# Patient Record
Sex: Female | Born: 1990
Health system: Southern US, Community
[De-identification: ages and names within clinical notes are randomized; demographics above are authoritative.]

## PROBLEM LIST (undated history)

## (undated) ENCOUNTER — Inpatient Hospital Stay (HOSPITAL_COMMUNITY): Payer: Self-pay

## (undated) DIAGNOSIS — R519 Headache, unspecified: Secondary | ICD-10-CM

## (undated) DIAGNOSIS — F329 Major depressive disorder, single episode, unspecified: Secondary | ICD-10-CM

## (undated) DIAGNOSIS — Z789 Other specified health status: Secondary | ICD-10-CM

## (undated) DIAGNOSIS — F3111 Bipolar disorder, current episode manic without psychotic features, mild: Secondary | ICD-10-CM

## (undated) DIAGNOSIS — N189 Chronic kidney disease, unspecified: Secondary | ICD-10-CM

## (undated) DIAGNOSIS — R87629 Unspecified abnormal cytological findings in specimens from vagina: Secondary | ICD-10-CM

## (undated) DIAGNOSIS — F419 Anxiety disorder, unspecified: Secondary | ICD-10-CM

## (undated) DIAGNOSIS — F32A Depression, unspecified: Secondary | ICD-10-CM

## (undated) DIAGNOSIS — A549 Gonococcal infection, unspecified: Secondary | ICD-10-CM

## (undated) DIAGNOSIS — Z8759 Personal history of other complications of pregnancy, childbirth and the puerperium: Secondary | ICD-10-CM

## (undated) DIAGNOSIS — B999 Unspecified infectious disease: Secondary | ICD-10-CM

## (undated) DIAGNOSIS — R51 Headache: Secondary | ICD-10-CM

## (undated) HISTORY — DX: Chronic kidney disease, unspecified: N18.9

## (undated) HISTORY — DX: Depression, unspecified: F32.A

## (undated) HISTORY — PX: WISDOM TOOTH EXTRACTION: SHX21

## (undated) HISTORY — DX: Bipolar disorder, current episode manic without psychotic features, mild: F31.11

## (undated) HISTORY — DX: Major depressive disorder, single episode, unspecified: F32.9

## (undated) HISTORY — DX: Anxiety disorder, unspecified: F41.9

## (undated) HISTORY — PX: NO PAST SURGERIES: SHX2092

---
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
## 2010-05-10 ENCOUNTER — Inpatient Hospital Stay (HOSPITAL_COMMUNITY)
Admission: AD | Admit: 2010-05-10 | Discharge: 2010-05-10 | Payer: Self-pay | Source: Home / Self Care | Attending: Obstetrics & Gynecology | Admitting: Obstetrics & Gynecology

## 2010-05-11 LAB — WET PREP, GENITAL
Trich, Wet Prep: NONE SEEN
Yeast Wet Prep HPF POC: NONE SEEN

## 2010-05-11 LAB — URINALYSIS, ROUTINE W REFLEX MICROSCOPIC
Bilirubin Urine: NEGATIVE
Hgb urine dipstick: NEGATIVE
Ketones, ur: NEGATIVE mg/dL
Nitrite: NEGATIVE
Protein, ur: NEGATIVE mg/dL
Specific Gravity, Urine: 1.025 (ref 1.005–1.030)
Urine Glucose, Fasting: NEGATIVE mg/dL
Urobilinogen, UA: 0.2 mg/dL (ref 0.0–1.0)
pH: 7 (ref 5.0–8.0)

## 2010-05-11 LAB — CBC
HCT: 37.2 % (ref 36.0–46.0)
Hemoglobin: 13.4 g/dL (ref 12.0–15.0)
MCH: 30.3 pg (ref 26.0–34.0)
MCHC: 36 g/dL (ref 30.0–36.0)
MCV: 84.2 fL (ref 78.0–100.0)
Platelets: 192 10*3/uL (ref 150–400)
RBC: 4.42 MIL/uL (ref 3.87–5.11)
RDW: 11.7 % (ref 11.5–15.5)
WBC: 10.7 10*3/uL — ABNORMAL HIGH (ref 4.0–10.5)

## 2010-05-11 LAB — POCT PREGNANCY, URINE: Preg Test, Ur: POSITIVE

## 2010-05-11 LAB — ABO/RH: ABO/RH(D): O POS

## 2010-05-11 LAB — HCG, QUANTITATIVE, PREGNANCY: hCG, Beta Chain, Quant, S: 798 m[IU]/mL — ABNORMAL HIGH (ref <5)

## 2010-05-16 LAB — GC/CHLAMYDIA PROBE AMP, GENITAL
Chlamydia, DNA Probe: NEGATIVE
GC Probe Amp, Genital: NEGATIVE

## 2010-05-20 ENCOUNTER — Inpatient Hospital Stay (HOSPITAL_COMMUNITY)
Admission: AD | Admit: 2010-05-20 | Discharge: 2010-05-20 | Payer: Self-pay | Source: Home / Self Care | Attending: Obstetrics & Gynecology | Admitting: Obstetrics & Gynecology

## 2010-08-01 LAB — CBC
HCT: 39.4 % (ref 36.0–46.0)
Hemoglobin: 13.6 g/dL (ref 12.0–15.0)
MCHC: 34.6 g/dL (ref 30.0–36.0)
MCV: 90.5 fL (ref 78.0–100.0)
Platelets: 147 10*3/uL — ABNORMAL LOW (ref 150–400)
RBC: 4.36 MIL/uL (ref 3.87–5.11)
RDW: 12.1 % (ref 11.5–15.5)
WBC: 7.9 10*3/uL (ref 4.0–10.5)

## 2010-08-01 LAB — URINALYSIS, ROUTINE W REFLEX MICROSCOPIC
Bilirubin Urine: NEGATIVE
Glucose, UA: NEGATIVE mg/dL
Hgb urine dipstick: NEGATIVE
Ketones, ur: NEGATIVE mg/dL
Nitrite: NEGATIVE
Protein, ur: NEGATIVE mg/dL
Specific Gravity, Urine: 1.015 (ref 1.005–1.030)
Urobilinogen, UA: 1 mg/dL (ref 0.0–1.0)
pH: 6 (ref 5.0–8.0)

## 2010-08-01 LAB — COMPREHENSIVE METABOLIC PANEL
ALT: 13 U/L (ref 0–35)
AST: 24 U/L (ref 0–37)
Albumin: 4 g/dL (ref 3.5–5.2)
Alkaline Phosphatase: 83 U/L (ref 39–117)
BUN: 7 mg/dL (ref 6–23)
CO2: 26 mEq/L (ref 19–32)
Calcium: 9 mg/dL (ref 8.4–10.5)
Chloride: 102 mEq/L (ref 96–112)
Creatinine, Ser: 0.71 mg/dL (ref 0.4–1.2)
GFR calc Af Amer: >60 mL/min (ref >60)
GFR calc non Af Amer: >60 mL/min (ref >60)
Glucose, Bld: 82 mg/dL (ref 70–99)
Potassium: 3.9 mEq/L (ref 3.5–5.1)
Sodium: 135 mEq/L (ref 135–145)
Total Bilirubin: 0.6 mg/dL (ref 0.3–1.2)
Total Protein: 6.9 g/dL (ref 6.0–8.3)

## 2010-08-01 LAB — RAPID STREP SCREEN (MED CTR MEBANE ONLY): Streptococcus, Group A Screen (Direct): NEGATIVE

## 2010-08-01 LAB — POCT PREGNANCY, URINE: Preg Test, Ur: NEGATIVE

## 2010-08-04 LAB — WET PREP, GENITAL
Trich, Wet Prep: NONE SEEN
Yeast Wet Prep HPF POC: NONE SEEN

## 2010-08-04 LAB — HCG, SERUM, QUALITATIVE: Preg, Serum: NEGATIVE

## 2010-08-04 LAB — CBC
HCT: 40.1 % (ref 36.0–46.0)
Hemoglobin: 13.4 g/dL (ref 12.0–15.0)
MCHC: 33.5 g/dL (ref 30.0–36.0)
MCV: 90.9 fL (ref 78.0–100.0)
Platelets: 175 10*3/uL (ref 150–400)
RBC: 4.41 MIL/uL (ref 3.87–5.11)
RDW: 12.3 % (ref 11.5–15.5)
WBC: 5.9 10*3/uL (ref 4.0–10.5)

## 2010-08-04 LAB — SAMPLE TO BLOOD BANK

## 2010-08-04 LAB — GC/CHLAMYDIA PROBE AMP, GENITAL
Chlamydia, DNA Probe: NEGATIVE
GC Probe Amp, Genital: NEGATIVE

## 2010-10-11 ENCOUNTER — Other Ambulatory Visit: Payer: Self-pay | Admitting: Obstetrics

## 2010-10-11 ENCOUNTER — Ambulatory Visit (HOSPITAL_COMMUNITY)
Admission: RE | Admit: 2010-10-11 | Discharge: 2010-10-11 | Disposition: A | Discharge status: Home / Self Care | Payer: Medicaid Other | Source: Ambulatory Visit | Attending: Obstetrics | Admitting: Obstetrics

## 2010-10-11 ENCOUNTER — Other Ambulatory Visit (HOSPITAL_COMMUNITY): Payer: Self-pay | Admitting: Obstetrics

## 2010-10-11 ENCOUNTER — Inpatient Hospital Stay (HOSPITAL_COMMUNITY)
Admission: AD | Admit: 2010-10-11 | Discharge: 2010-10-12 | DRG: 779 | Disposition: A | Discharge status: Home / Self Care | Payer: Medicaid Other | Source: Ambulatory Visit | Attending: Obstetrics | Admitting: Obstetrics

## 2010-10-11 DIAGNOSIS — Z3689 Encounter for other specified antenatal screening: Secondary | ICD-10-CM | POA: Insufficient documentation

## 2010-10-11 DIAGNOSIS — O021 Missed abortion: Principal | ICD-10-CM | POA: Diagnosis present

## 2010-10-11 DIAGNOSIS — O368390 Maternal care for abnormalities of the fetal heart rate or rhythm, unspecified trimester, not applicable or unspecified: Secondary | ICD-10-CM | POA: Insufficient documentation

## 2010-10-11 LAB — CBC
HCT: 35.9 % — ABNORMAL LOW (ref 36.0–46.0)
Hemoglobin: 12.7 g/dL (ref 12.0–15.0)
MCH: 31.4 pg (ref 26.0–34.0)
MCHC: 35.4 g/dL (ref 30.0–36.0)
MCV: 88.9 fL (ref 78.0–100.0)
Platelets: 158 10*3/uL (ref 150–400)
RBC: 4.04 MIL/uL (ref 3.87–5.11)
RDW: 12.2 % (ref 11.5–15.5)
WBC: 11.4 10*3/uL — ABNORMAL HIGH (ref 4.0–10.5)

## 2010-10-11 LAB — MRSA PCR SCREENING: MRSA by PCR: NEGATIVE

## 2010-10-19 NOTE — Discharge Summary (Signed)
  NAMEMarland Kitchen  MYRTH, DAHAN NO.:  [id number]  MEDICAL RECORD NO.:  [id number]  LOCATION:  9372                          FACILITY:  [facility]  PHYSICIAN:  Kathreen Cosier, M.D.DATE OF BIRTH:  09/03/1990  DATE OF ADMISSION:  10/11/2010 DATE OF DISCHARGE:  10/12/2010                              DISCHARGE SUMMARY   The patient is a 20 year old gravida 1, EDC January 14, 2011, who was seen in the office on August 16, 2010, at 18+ weeks.  Ultrasound performed gave her due dated January 20, 2011, without any problems. The patient came for a followup visit on October 11, 2010, and the uterus was still 18-week size and ultrasound confirmed fetal demise.  The patient was admitted and she had Cytotec 600 mcg placed intravaginally at 4 p.m. and at 12:25 a.m., she passed the fetus and placenta intact. The patient was upset and given Xanax 0.5 t.i.d. and Tylenol No. 3 for pain.  Her platelets were 175, she was O+, antibody negative, RPR negative, hepatitis screen negative.  She was discharged on October 20, 2010, ambulatory on a regular diet to see me in 4 weeks.  DISCHARGE DIAGNOSIS:  Intrauterine fetal transfusion at 19 weeks.          ______________________________ Kathreen Cosier, M.D.     BAM/MEDQ  D:  10/12/2010  T:  10/12/2010  Job:  [id number]  Electronically Signed by Francoise Ceo M.D. on 10/19/2010 09:17:51 AM

## 2010-10-23 DEATH — deceased

## 2010-11-23 ENCOUNTER — Inpatient Hospital Stay (HOSPITAL_COMMUNITY): Admission: AD | Admit: 2010-11-23 | Payer: Self-pay | Source: Ambulatory Visit | Admitting: Obstetrics

## 2011-11-15 ENCOUNTER — Encounter (HOSPITAL_COMMUNITY): Payer: Self-pay | Admitting: *Deleted

## 2011-11-15 ENCOUNTER — Emergency Department (HOSPITAL_COMMUNITY)
Admission: EM | Admit: 2011-11-15 | Discharge: 2011-11-15 | Disposition: A | Discharge status: Home / Self Care | Payer: Self-pay | Attending: Emergency Medicine | Admitting: Emergency Medicine

## 2011-11-15 DIAGNOSIS — F17200 Nicotine dependence, unspecified, uncomplicated: Secondary | ICD-10-CM | POA: Insufficient documentation

## 2011-11-15 DIAGNOSIS — R55 Syncope and collapse: Secondary | ICD-10-CM | POA: Insufficient documentation

## 2011-11-15 LAB — GLUCOSE, CAPILLARY: Glucose-Capillary: 76 mg/dL (ref 70–99)

## 2011-11-15 NOTE — ED Notes (Signed)
Pt reports having dizziness when she arrived at work today, states that she had nuggets and drank some coke prior to the dizziness.  Pt reports when she started to work, she became dizzy again and became nauseous.  Pt states that she sat down and drank some sprite, dizziness went away but does not feel the same.

## 2011-11-15 NOTE — ED Provider Notes (Addendum)
History     CSN: [id number]  Arrival date & time 11/15/11  1158   First MD Initiated Contact with Patient 11/15/11 1227      Chief Complaint  Patient presents with  . Dizziness    (Consider location/radiation/quality/duration/timing/severity/associated sxs/prior treatment) HPI Comments: Patient presents after an episode of lightheadedness and dizziness while at work.  Patient works at [employer] and was taking orders and started to feel lightheaded and dizzy.  She notes that she started to feel like things were going black and that she was hot and sweaty.  She sat down and after a few minutes began to feel somewhat better.  She states that she did her normal morning routine and did have some chicken nuggets and Coca[employer]a this morning.  She reports that she still feels slightly "off" but otherwise back to normal.  Her last menstrual period was July 1 and normal for her.  She does note that she is attempting to get pregnant.  She has no abdominal pain.  She notes no chest pain or shortness of breath.  She states she's been otherwise well and without fevers, vomiting or diarrhea.  No history of diabetes.  The history is provided by the patient. No language interpreter was used.    History reviewed. No pertinent past medical history.  History reviewed. No pertinent past surgical history.  History reviewed. No pertinent family history.  History  Substance Use Topics  . Smoking status: Current Everyday Smoker -- 1.0 packs/day    Types: Cigarettes  . Smokeless tobacco: Not on file  . Alcohol Use: Yes     occa    OB History    Grav Para Term Preterm Abortions TAB SAB Ect Mult Living                  Review of Systems  Constitutional: Positive for diaphoresis. Negative for fever and chills.  HENT: Negative.   Eyes: Negative.   Respiratory: Negative.  Negative for shortness of breath.   Cardiovascular: Negative.  Negative for chest pain.  Gastrointestinal: Positive for nausea.  Negative for vomiting, abdominal pain and diarrhea.  Genitourinary: Negative.  Negative for dysuria.  Musculoskeletal: Negative.  Negative for back pain.  Skin: Negative.  Negative for color change and rash.  Neurological: Positive for dizziness and light-headedness. Negative for syncope and headaches.  Hematological: Negative.   Psychiatric/Behavioral: Negative.  Negative for confusion.  All other systems reviewed and are negative.    Allergies  Review of patient's allergies indicates no known allergies.  Home Medications  No current outpatient prescriptions on file.  BP 123/55  Pulse 96  Temp 99.4 F (37.4 C) (Oral)  Resp 20  SpO2 99%  LMP 10/23/2011  Physical Exam  Nursing note and vitals reviewed. Constitutional: She is oriented to person, place, and time. She appears well-developed and well-nourished.  Non-toxic appearance. She does not have a sickly appearance.  HENT:  Head: Normocephalic and atraumatic.  Eyes: Conjunctivae, EOM and lids are normal. Pupils are equal, round, and reactive to light. No scleral icterus.  Neck: Trachea normal and normal range of motion. Neck supple.  Cardiovascular: Normal rate, regular rhythm and normal heart sounds.  Exam reveals no gallop.   No murmur heard. Pulmonary/Chest: Effort normal and breath sounds normal. No respiratory distress. She has no wheezes. She has no rales.  Abdominal: Soft. Normal appearance. There is no tenderness. There is no rebound, no guarding and no CVA tenderness.  Musculoskeletal: Normal range of motion.  Neurological:  She is alert and oriented to person, place, and time. She has normal strength.  Skin: Skin is warm, dry and intact. No rash noted.  Psychiatric: She has a normal mood and affect. Her behavior is normal. Judgment and thought content normal.    ED Course  Procedures (including critical care time) Results for orders placed during the hospital encounter of 11/15/11  GLUCOSE, CAPILLARY       Component Value Range   Glucose-Capillary 76  70 - 99 mg/dL   Comment 1 Notify RN    POCT PREGNANCY, URINE      Component Value Range   Preg Test, Ur NEGATIVE  NEGATIVE       Date: 11/15/2011  Rate: 68  Rhythm: normal sinus rhythm  QRS Axis: normal  Intervals: normal  ST/T Wave abnormalities: normal  Conduction Disutrbances:none  Narrative Interpretation:   Old EKG Reviewed: none available    MDM  Patient with likely vasovagal presyncope.  Her glucose here is normal.  Her vital signs are also normal.  Her EKG shows no acute dysrhythmias or abnormalities.  I will check a pregnancy test as well.  I anticipate the patient's discharge with followup with her primary care physician.  She has no symptoms for infection at this time.  I've encouraged her to continue with good hydration and given her precautions to lay down should further episodes occur in the future.        Nat Christen, MD 11/15/11 1304  Nat Christen, MD 11/15/11 1324

## 2011-11-28 ENCOUNTER — Inpatient Hospital Stay (HOSPITAL_COMMUNITY)
Admission: AD | Admit: 2011-11-28 | Discharge: 2011-11-28 | Disposition: A | Discharge status: Home / Self Care | Payer: Medicaid Other | Source: Ambulatory Visit | Attending: Obstetrics | Admitting: Obstetrics

## 2011-11-28 ENCOUNTER — Encounter (HOSPITAL_COMMUNITY): Payer: Self-pay

## 2011-11-28 DIAGNOSIS — R42 Dizziness and giddiness: Secondary | ICD-10-CM

## 2011-11-28 DIAGNOSIS — Z3201 Encounter for pregnancy test, result positive: Secondary | ICD-10-CM

## 2011-11-28 HISTORY — DX: Personal history of other complications of pregnancy, childbirth and the puerperium: Z87.59

## 2011-11-28 LAB — CBC
HCT: 36.8 % (ref 36.0–46.0)
Hemoglobin: 12.4 g/dL (ref 12.0–15.0)
RBC: 4.12 MIL/uL (ref 3.87–5.11)
WBC: 7.1 10*3/uL (ref 4.0–10.5)

## 2011-11-28 LAB — POCT PREGNANCY, URINE: Preg Test, Ur: POSITIVE — AB

## 2011-11-28 LAB — HCG, QUANTITATIVE, PREGNANCY: hCG, Beta Chain, Quant, S: 1866 m[IU]/mL — ABNORMAL HIGH (ref <5)

## 2011-11-28 LAB — URINALYSIS, ROUTINE W REFLEX MICROSCOPIC
Glucose, UA: NEGATIVE mg/dL
Hgb urine dipstick: NEGATIVE
Protein, ur: NEGATIVE mg/dL
Specific Gravity, Urine: 1.02 (ref 1.005–1.030)
pH: 7.5 (ref 5.0–8.0)
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
## 2011-11-28 NOTE — MAU Note (Signed)
Pt states, " I've been dizzy for the past two weeks, and I didn't start my period last week."

## 2011-11-28 NOTE — MAU Provider Note (Signed)
History     CSN: [id number]  Arrival date and time: 11/28/11 1117   None     Chief Complaint  Patient presents with  . Dizziness  . Amenorrhea   HPI Morgan Galloway is a 21 y.o. female @ 4.[redacted] weeks gestation who presents to MAU with dizziness. She was evaluated @ [facility] 7/24 for same.  On her 7/24 visit she had normal EKG, neg pregnancy test and normal glucose. She was instructed to follow up with PCP. She comes to MAU today because she missed her period. Last period 10/25/11. No birth control, unprotected sex. Current sex partner x 1 year. 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 No follow up. Hx of GC and Chlamydia. Denies vaginal discharge or pelvic pain. Patient states that she missed her period, has breast tenderness and feel pregnant. Last sexual intercourse this morning. States she has sex at least 5 days a week. The history was provided by the patient.  OB History    Grav Para Term Preterm Abortions TAB SAB Ect Mult Living   1 1  1       0      Past Medical History  Diagnosis Date  . History of stillbirth     June 2012    Past Surgical History  Procedure Date  . No past surgeries     Family History  Problem Relation Age of Onset  . Other Neg Hx     History  Substance Use Topics  . Smoking status: Current Everyday Smoker -- 0.5 packs/day    Types: Cigarettes  . Smokeless tobacco: Never Used  . Alcohol Use: No    Allergies: No Known Allergies  No prescriptions prior to admission    Review of Systems  Constitutional: Negative for fever, chills and weight loss.  HENT: Negative for ear pain, nosebleeds, congestion, sore throat and neck pain.   Eyes: Negative for blurred vision, double vision, photophobia and pain.  Respiratory: Negative for cough, shortness of breath and wheezing.   Cardiovascular: Negative for chest pain, palpitations and leg swelling.  Gastrointestinal: Positive for constipation. Negative for heartburn, nausea, vomiting, abdominal pain and  diarrhea.  Genitourinary: Negative for dysuria, urgency and frequency.  Musculoskeletal: Negative for myalgias and back pain.  Skin: Negative for itching and rash.  Neurological: Positive for dizziness and headaches. Negative for sensory change, speech change, seizures and weakness.  Endo/Heme/Allergies: Does not bruise/bleed easily.  Psychiatric/Behavioral: Negative for depression. The patient is not nervous/anxious and does not have insomnia.    Physical Exam   Blood pressure 118/54, pulse 68, temperature 97.4 F (36.3 C), temperature source Oral, height 5' 4.5" (1.638 m), weight 125 lb 6 oz (56.87 kg), last menstrual period 10/23/2011, SpO2 100.00%.  Physical Exam  Constitutional: She is oriented to person, place, and time. She appears well-developed and well-nourished. No distress.  HENT:  Head: Normocephalic and atraumatic.  Eyes: EOM are normal.  Neck: Neck supple.  Cardiovascular: Normal rate.   Respiratory: Effort normal.  GI: Soft. There is no tenderness.  Musculoskeletal: Normal range of motion.  Neurological: She is alert and oriented to person, place, and time.  Skin: Skin is warm and dry.  Psychiatric: She has a normal mood and affect. Her behavior is normal. Judgment and thought content normal.    MAU Course: patient states she only wants conformation of pregnancy today.  Procedures Discussed with the patient and all questioned fully answered. She will return if any problems arise.  Results for orders  placed during the hospital encounter of 11/28/11 (from the past 24 hour(s))  URINALYSIS, ROUTINE W REFLEX MICROSCOPIC     Status: Normal   Collection Time   11/28/11 11:38 AM      Component Value Range   Color, Urine YELLOW  YELLOW   APPearance CLEAR  CLEAR   Specific Gravity, Urine 1.020  1.005 - 1.030   pH 7.5  5.0 - 8.0   Glucose, UA NEGATIVE  NEGATIVE mg/dL   Hgb urine dipstick NEGATIVE  NEGATIVE   Bilirubin Urine NEGATIVE  NEGATIVE   Ketones, ur NEGATIVE   NEGATIVE mg/dL   Protein, ur NEGATIVE  NEGATIVE mg/dL   Urobilinogen, UA 0.2  0.0 - 1.0 mg/dL   Nitrite NEGATIVE  NEGATIVE   Leukocytes, UA NEGATIVE  NEGATIVE  POCT PREGNANCY, URINE     Status: Abnormal   Collection Time   11/28/11  2:25 PM      Component Value Range   Preg Test, Ur POSITIVE (*) NEGATIVE   Follow-up Information    Schedule an appointment as soon as possible for a visit with Kathreen Cosier, MD.   Contact information:   [address] [place] [address] [phone]         Assessment: Positive pregnancy test    Plan:  Start prenatal care and prenatal vitamins   Instructions on ABC's of pregnancy   Return as needed.  Shaia Porath, RN, FNP, [facility] 11/28/2011, 2:29 PM

## 2011-12-20 ENCOUNTER — Encounter (HOSPITAL_COMMUNITY): Payer: Self-pay | Admitting: *Deleted

## 2011-12-20 ENCOUNTER — Inpatient Hospital Stay (HOSPITAL_COMMUNITY)
Admission: AD | Admit: 2011-12-20 | Discharge: 2011-12-20 | Disposition: A | Discharge status: Home / Self Care | Payer: Medicaid Other | Source: Ambulatory Visit | Attending: Obstetrics & Gynecology | Admitting: Obstetrics & Gynecology

## 2011-12-20 ENCOUNTER — Inpatient Hospital Stay (HOSPITAL_COMMUNITY): Payer: Medicaid Other

## 2011-12-20 DIAGNOSIS — O209 Hemorrhage in early pregnancy, unspecified: Secondary | ICD-10-CM | POA: Insufficient documentation

## 2011-12-20 DIAGNOSIS — O468X9 Other antepartum hemorrhage, unspecified trimester: Secondary | ICD-10-CM

## 2011-12-20 LAB — WET PREP, GENITAL
Trich, Wet Prep: NONE SEEN
Yeast Wet Prep HPF POC: NONE SEEN

## 2011-12-20 NOTE — MAU Note (Signed)
First episode of any bleeding.

## 2011-12-20 NOTE — MAU Note (Signed)
C/o bright red spotting that started about a hour ago; denies any UTI symptoms; denies any pain; states that bleeding was not enough to wear a pad; FOB with pt; intercourse earlier today;

## 2011-12-20 NOTE — ED Provider Notes (Signed)
History     CSN: [id number]  Arrival date and time: 12/20/11 1604   First Provider Initiated Contact with Patient 12/20/11 1637      Chief Complaint  Patient presents with  . Vaginal Bleeding   HPI Patient is a 21 year old g2p0010 at [redacted]w[redacted]d by LMP with previous fetal demise secondary to nuchal ord complications who presents with spotting that began this afternoon. States she saw a couple of drops on her underwear and when she wiped her vagina. No clots or other discharge noted. She most recently had sex this am. Notes some "need to bm" discomfort. No cramping, contractions, pain, headache, N/V, SOB, anorexia, dysuria, lightheaded, dizziness or syncope.  Presented to Currie Paris, NP   OB History    Grav Para Term Preterm Abortions TAB SAB Ect Mult Living   2 1  1       0      Past Medical History  Diagnosis Date  . History of stillbirth     June 2012    Past Surgical History  Procedure Date  . No past surgeries     Family History  Problem Relation Age of Onset  . Other Neg Hx   . Cancer Maternal Grandmother     History  Substance Use Topics  . Smoking status: Former Smoker -- 0.5 packs/day    Types: Cigarettes  . Smokeless tobacco: Never Used  . Alcohol Use: No    Allergies: No Known Allergies  Prescriptions prior to admission  Medication Sig Dispense Refill  . Prenatal Vit-Fe Fumarate-FA (PRENATAL MULTIVITAMIN) TABS Take 1 tablet by mouth daily.        ROS SEE HPI Physical Exam   Blood pressure 127/51, pulse 69, temperature 98.3 F (36.8 C), temperature source Oral, resp. rate 16, height 5\' 5"  (1.651 m), weight 126 lb (57.153 kg), last menstrual period 10/23/2011.  Physical Exam  Constitutional: She appears well-developed and well-nourished. No distress.  HENT:  Head: Normocephalic and atraumatic.  Right Ear: External ear normal.  Left Ear: External ear normal.  Cardiovascular: Normal rate, regular rhythm, S1 normal, S2 normal, normal heart  sounds and intact distal pulses.  Exam reveals no gallop and no friction rub.   No murmur heard. Respiratory: Effort normal and breath sounds normal. No respiratory distress. She has no wheezes. She has no rales. She exhibits no tenderness.  GI: Soft. Normal aorta and bowel sounds are normal. She exhibits no distension, no pulsatile liver, no abdominal bruit and no mass. There is no hepatosplenomegaly. There is no tenderness. There is no rebound and no guarding.  Genitourinary: Vagina normal. There is no rash, tenderness, lesion or injury on the right labia. There is no tenderness, lesion or injury on the left labia.       Cervix closed, trace blood in the os. No odor or other discharge. G/C Wet prep done.  Chaperone Present during exam.  Skin: She is not diaphoretic.    MAU Course  Procedures Clinical Data: Vaginal bleeding. Assess viability.  OBSTETRIC <14 WK [place] AND TRANSVAGINAL OB US  Technique: Both transabdominal and transvaginal ultrasound  examinations were performed for complete evaluation of the  gestation as well as the maternal uterus, adnexal regions, and  pelvic cul-de-sac. Transvaginal technique was performed to assess  early pregnancy.  Comparison: None for this pregnancy.  Intrauterine gestational sac: A single intrauterine gestational  sac is somewhat irregular in shape.  Yolk sac: An enlarged yolk sac is present.  Embryo: Present.  Cardiac Activity: Not present  MSD: 1.62 mm 6 w 4 d  CRL: 0.51 mm 6 w 2 d [place] EDC: 08/13/2011  Maternal uterus/adnexae:  The ovaries are within normal limits bilaterally. A small rounded  subchorionic hemorrhage is present along the inferior aspect of the  gestational sac. There is no significant free fluid.  IMPRESSION:  1. Slightly irregular gestational sac and small subchorionic  hemorrhage.  2. Although the embryo is identified, no definite heartbeat is  seen. The given the small size of the embryo, recommend 14-day  follow-up  ultrasound for further evaluation. The scan may be too  early to identify a heart beat.  MDM Results for orders placed during the hospital encounter of 12/20/11 (from the past 24 hour(s))  WET PREP, GENITAL     Status: Abnormal   Collection Time   12/20/11  5:00 PM      Component Value Range   Yeast Wet Prep HPF POC NONE SEEN  NONE SEEN   Trich, Wet Prep NONE SEEN  NONE SEEN   Clue Cells Wet Prep HPF POC MODERATE (*) NONE SEEN   WBC, Wet Prep HPF POC NONE SEEN  NONE SEEN    Assessment and Plan  Bleeding in pregnancy  - Gestational age [redacted]w[redacted]d Subchorionic hemorrhage  Plan Pelvic rest Repeat ultrasound in 2 weeks - ultrasound will schedule Return if you have severe bleeding or worsening abdominal pain.   Doran Heater 12/20/2011, 5:09 PM   I agree with exam and documentation as listed above.  Nolene Bernheim, RN, FNP  Currie Paris, NP 12/20/11 1850

## 2011-12-20 NOTE — MAU Note (Signed)
Noticed some bleeding today.  Red, light in amt; no clots. No pain.

## 2011-12-21 LAB — GC/CHLAMYDIA PROBE AMP, GENITAL
Chlamydia, DNA Probe: NEGATIVE
GC Probe Amp, Genital: NEGATIVE

## 2011-12-21 NOTE — ED Provider Notes (Signed)
Attestation of Attending Supervision of Advanced Practitioner (CNM/NP): Evaluation and management procedures were performed by the Advanced Practitioner under my supervision and collaboration.  I have reviewed the Advanced Practitioner's note and chart, and I agree with the management and plan.  Dhanya Bogle, MD, FACOG Attending Obstetrician & Gynecologist Faculty Practice, [facility]  

## 2011-12-23 ENCOUNTER — Inpatient Hospital Stay (HOSPITAL_COMMUNITY)
Admission: AD | Admit: 2011-12-23 | Discharge: 2011-12-23 | Disposition: A | Discharge status: Home / Self Care | Payer: Medicaid Other | Source: Ambulatory Visit | Attending: Obstetrics & Gynecology | Admitting: Obstetrics & Gynecology

## 2011-12-23 ENCOUNTER — Encounter (HOSPITAL_COMMUNITY): Payer: Self-pay | Admitting: Advanced Practice Midwife

## 2011-12-23 ENCOUNTER — Inpatient Hospital Stay (HOSPITAL_COMMUNITY): Payer: Medicaid Other

## 2011-12-23 DIAGNOSIS — M545 Low back pain, unspecified: Secondary | ICD-10-CM | POA: Insufficient documentation

## 2011-12-23 DIAGNOSIS — O039 Complete or unspecified spontaneous abortion without complication: Secondary | ICD-10-CM | POA: Insufficient documentation

## 2011-12-23 DIAGNOSIS — R109 Unspecified abdominal pain: Secondary | ICD-10-CM | POA: Insufficient documentation

## 2011-12-23 MED ORDER — IBUPROFEN 600 MG PO TABS: 600.0000 mg | ORAL_TABLET | Freq: Four times a day (QID) | ORAL | Status: AC | PRN

## 2011-12-23 NOTE — MAU Provider Note (Signed)
Chief Complaint: Abdominal Pain, Back Pain and Vaginal Bleeding   First Provider Initiated Contact with Patient 12/23/11 1433     SUBJECTIVE HPI: Morgan Galloway is a 20 y.o. G2P0100 at [redacted]w[redacted]d by LMP who presents with increased vaginal bleeding, cramping and LBP. She was seen in MAU 12/20/11 for bleeding and US showed 6.2 week PF w/ no cardiac activity.   Past Medical History  Diagnosis Date  . History of stillbirth     June 2012   OB History    Grav Para Term Preterm Abortions TAB SAB Ect Mult Living   2 1  1       0     # Outc Date GA Lbr Len/2nd Wgt Sex Del Anes PTL Lv   1 PRE            2 CUR              Past Surgical History  Procedure Date  . No past surgeries    History   Social History  . Marital Status: Married    Spouse Name: N/A    Number of Children: N/A  . Years of Education: N/A   Occupational History  . Not on file.   Social History Main Topics  . Smoking status: Former Smoker -- 0.5 packs/day    Types: Cigarettes  . Smokeless tobacco: Never Used  . Alcohol Use: No  . Drug Use: No  . Sexually Active: Yes    Birth Control/ Protection: None   Other Topics Concern  . Not on file   Social History Narrative  . No narrative on file   No current facility-administered medications on file prior to encounter.   Current Outpatient Prescriptions on File Prior to Encounter  Medication Sig Dispense Refill  . Prenatal Vit-Fe Fumarate-FA (PRENATAL MULTIVITAMIN) TABS Take 1 tablet by mouth daily.       No Known Allergies  ROS: Pertinent items in HPI  OBJECTIVE Blood pressure 136/62, pulse 72, temperature 99 F (37.2 C), temperature source Oral, resp. rate 16, last menstrual period 10/23/2011, SpO2 100.00%. GENERAL: Well-developed, well-nourished female in no acute distress.  HEENT: Normocephalic HEART: normal rate RESP: normal effort ABDOMEN: Soft, non-tender EXTREMITIES: Nontender, no edema NEURO: Alert and oriented SPECULUM EXAM: NEFG, moderate  amount of BRB with clots noted, cervix clean. No lesions or polyps. BIMANUAL: cervix closed; uterus normal size, no adnexal tenderness or masses  LAB RESULTS NA  IMAGING US Ob Transvaginal  12/23/2011  *RADIOLOGY REPORT*  Clinical Data: 21 year old G2 P0 AB1, LMP 10/23/2011 (8 weeks 5 days), presenting with vaginal bleeding and cramping.  TRANSVAGINAL OB ULTRASOUND  Technique:  Transvaginal ultrasound was performed for evaluation of the gestation as well as the maternal uterus and adnexal regions.  Comparison: Early OB ultrasound 12/20/2011.  Findings: Interval evacuation of the intrauterine gestational sac and embryo identified on the examination 3 days ago.  No remaining visible gestational sac, yolk sac, or embryo.  Endometrial canal heterogeneously thickened measuring up to approximately 17 mm.  No color Doppler flow within the thickened endometrium to confirm retained products.  Both ovaries normal in size appearance, the right measuring approximately 3.0 x 1.4 x 2.3 cm and the left measuring approximately 3.0 x 2.2 x 2.1 cm.  Hemorrhagic corpus luteum cyst noted in the left ovary.  No free pelvic fluid.  IMPRESSION:  1.  Spontaneous abortion.  The gestational sac and embryo previously identified on the examination 3 days ago has been evacuated in the interval.  2.  Heterogeneous endometrial thickening, likely related to blood products.  Lack of Doppler flow within the endometrium suggests against retained products. 3.  Normal-appearing ovaries.  No adnexal masses or free pelvic fluid.   Original Report Authenticated By: Arnell Sieving, M.D.    ASSESSMENT 1. SAB (spontaneous abortion)    PLAN Discharge home Support given Bleeding precautions Medication List  As of 12/23/2011  3:45 PM   TAKE these medications         ibuprofen 600 MG tablet   Commonly known as: ADVIL,MOTRIN   Take 1 tablet (600 mg total) by mouth every 6 (six) hours as needed for pain.      prenatal multivitamin Tabs    Take 1 tablet by mouth daily.           Follow-up Information    Follow up with [facility] in 1 week.   Contact information:   [address] [place] [address] [phone]      Follow up with [facility]. (mau as needed if symptoms worsen)    Contact information:   [address] [place] [address] [phone]         Dorathy Kinsman, [place] 12/23/2011  3:45 PM

## 2011-12-23 NOTE — MAU Note (Signed)
Patient states she has abdominal and back pain with dark vaginal bleeding.

## 2012-01-02 ENCOUNTER — Ambulatory Visit (HOSPITAL_COMMUNITY): Admission: RE | Admit: 2012-01-02 | Payer: Medicaid Other | Source: Ambulatory Visit

## 2012-01-10 ENCOUNTER — Encounter: Payer: Medicaid Other | Admitting: Advanced Practice Midwife

## 2012-03-22 ENCOUNTER — Encounter (HOSPITAL_COMMUNITY): Payer: Self-pay | Admitting: *Deleted

## 2012-03-22 ENCOUNTER — Inpatient Hospital Stay (HOSPITAL_COMMUNITY)
Admission: AD | Admit: 2012-03-22 | Discharge: 2012-03-22 | Disposition: A | Discharge status: Home / Self Care | Payer: Medicaid Other | Source: Ambulatory Visit | Attending: Family Medicine | Admitting: Family Medicine

## 2012-03-22 DIAGNOSIS — O200 Threatened abortion: Secondary | ICD-10-CM

## 2012-03-22 HISTORY — DX: Gonococcal infection, unspecified: A54.9

## 2012-03-22 LAB — WET PREP, GENITAL
WBC, Wet Prep HPF POC: NONE SEEN
Yeast Wet Prep HPF POC: NONE SEEN

## 2012-03-22 LAB — HCG, QUANTITATIVE, PREGNANCY: hCG, Beta Chain, Quant, S: 114 m[IU]/mL — ABNORMAL HIGH (ref <5)

## 2012-03-22 NOTE — MAU Provider Note (Signed)
Chart reviewed and agree with management and plan.  

## 2012-03-22 NOTE — MAU Note (Addendum)
Faint positive on HPT last wk.  Noted  Bright red spotting yesterday. Smear this morning.  Repeated home test was still positive.  HX of 2 prev miscarriage

## 2012-03-22 NOTE — MAU Note (Signed)
Pt reports positive home pg test one week ago; noticed one dime-sized bright red spot yesterday on pantyliner. Denies cramping or pain. Is concerned because of hx of miscarriages.

## 2012-03-22 NOTE — MAU Provider Note (Signed)
  History     CSN: [id number]  Arrival date and time: 03/22/12 1043   None     Chief Complaint  Patient presents with  . Possible Pregnancy  . Vaginal Bleeding   HPI  Morgan Galloway is a 21 y.o. G3P0110 who presents today with spotting. She states that yesterday she had about a dime size spot, and then today there was some blood there when she wiped.   Past Medical History  Diagnosis Date  . History of stillbirth     June 2012  . Gonorrhea     Past Surgical History  Procedure Date  . No past surgeries     Family History  Problem Relation Age of Onset  . Other Neg Hx   . Cancer Maternal Grandmother     History  Substance Use Topics  . Smoking status: Former Smoker -- 0.5 packs/day    Types: Cigarettes  . Smokeless tobacco: Never Used  . Alcohol Use: No    Allergies: No Known Allergies  Prescriptions prior to admission  Medication Sig Dispense Refill  . Prenatal Vit-Fe Fumarate-FA (PRENATAL MULTIVITAMIN) TABS Take 1 tablet by mouth daily.        Review of Systems  Constitutional: Negative for fever.  Gastrointestinal: Negative for nausea, vomiting, abdominal pain, diarrhea and constipation.  Genitourinary: Negative for dysuria and urgency.  Neurological: Negative for headaches.   Physical Exam   Blood pressure 124/46, pulse 77, temperature 97.9 F (36.6 C), temperature source Oral, resp. rate 18, height 5' 4.5" (1.638 m), weight 120 lb (54.432 kg), last menstrual period 02/11/2012, unknown if currently breastfeeding.  Physical Exam  Nursing note and vitals reviewed. Constitutional: She is oriented to person, place, and time. She appears well-developed and well-nourished.  Cardiovascular: Normal rate.   Respiratory: Effort normal.  GI: Soft. She exhibits no distension. There is no tenderness.  Genitourinary:        External: normal Vagina: normal, no blood seen. Small amount of white discharge Cervix: normal. No CMT Uterus: 5 wk size.  Non-tender Adnexa: Nontender  Neurological: She is alert and oriented to person, place, and time.  Skin: Skin is warm and dry.    MAU Course  Procedures  Results for orders placed during the hospital encounter of 03/22/12 (from the past 24 hour(s))  POCT PREGNANCY, URINE     Status: Abnormal   Collection Time   03/22/12 11:05 AM      Component Value Range   Preg Test, Ur POSITIVE (*) NEGATIVE  HCG, QUANTITATIVE, PREGNANCY     Status: Abnormal   Collection Time   03/22/12 11:25 AM      Component Value Range   hCG, Beta Chain, Quant, S 114 (*) <5 mIU/mL  WET PREP, GENITAL     Status: Abnormal   Collection Time   03/22/12 11:25 AM      Component Value Range   Yeast Wet Prep HPF POC NONE SEEN  NONE SEEN   Trich, Wet Prep NONE SEEN  NONE SEEN   Clue Cells Wet Prep HPF POC MANY (*) NONE SEEN   WBC, Wet Prep HPF POC NONE SEEN  NONE SEEN    Assessment and Plan  Early pregnancy Repeat BHCG in 48 hours.    Tawnya Crook 03/22/2012, 11:30 AM

## 2012-03-24 ENCOUNTER — Inpatient Hospital Stay (HOSPITAL_COMMUNITY)
Admission: AD | Admit: 2012-03-24 | Discharge: 2012-03-24 | Disposition: A | Discharge status: Home / Self Care | Payer: Medicaid Other | Source: Ambulatory Visit | Attending: Obstetrics & Gynecology | Admitting: Obstetrics & Gynecology

## 2012-03-24 ENCOUNTER — Encounter (HOSPITAL_COMMUNITY): Payer: Self-pay | Admitting: Advanced Practice Midwife

## 2012-03-24 DIAGNOSIS — O039 Complete or unspecified spontaneous abortion without complication: Secondary | ICD-10-CM | POA: Insufficient documentation

## 2012-03-24 LAB — HCG, QUANTITATIVE, PREGNANCY: hCG, Beta Chain, Quant, S: 74 m[IU]/mL — ABNORMAL HIGH (ref <5)

## 2012-03-24 NOTE — MAU Note (Signed)
Pt here for repeat BHCG level. Denies pain but stil having vaginal bleeding.

## 2012-03-24 NOTE — MAU Provider Note (Signed)
  History     CSN: [id number]  Arrival date and time: 03/24/12 1106   None     Chief Complaint  Patient presents with  . Follow-up   HPI 21 y.o. G3P0110 at [redacted]w[redacted]d here for repeat quant, seen 2 days ago in MAU with + UPT and spotting, quant 117 at that time. Pt reports continued spotting, no pain or heavy bleeding.    Past Medical History  Diagnosis Date  . History of stillbirth     June 2012  . Gonorrhea     Past Surgical History  Procedure Date  . No past surgeries     Family History  Problem Relation Age of Onset  . Other Neg Hx   . Cancer Maternal Grandmother     History  Substance Use Topics  . Smoking status: Former Smoker -- 0.5 packs/day    Types: Cigarettes  . Smokeless tobacco: Never Used  . Alcohol Use: No    Allergies: No Known Allergies  Prescriptions prior to admission  Medication Sig Dispense Refill  . Prenatal Vit-Fe Fumarate-FA (PRENATAL MULTIVITAMIN) TABS Take 1 tablet by mouth daily.        Review of Systems  Constitutional: Negative.   Respiratory: Negative.   Cardiovascular: Negative.   Gastrointestinal: Negative.   Genitourinary:       + bleeding    Physical Exam   Blood pressure 122/54, pulse 76, temperature 98 F (36.7 C), temperature source Oral, resp. rate 18, last menstrual period 02/11/2012.  Physical Exam  Nursing note and vitals reviewed. Constitutional: She is oriented to person, place, and time. She appears well-developed and well-nourished. No distress.  Cardiovascular: Normal rate.   Respiratory: Effort normal.  Musculoskeletal: Normal range of motion.  Neurological: She is alert and oriented to person, place, and time.  Skin: Skin is warm and dry.  Psychiatric: She has a normal mood and affect.    MAU Course  Procedures Results for orders placed during the hospital encounter of 03/24/12 (from the past 24 hour(s))  HCG, QUANTITATIVE, PREGNANCY     Status: Abnormal   Collection Time   03/24/12 11:27 AM   Component Value Range   hCG, Beta Chain, Quant, S 74 (*) <5 mIU/mL     Assessment and Plan   1. SAB (spontaneous abortion)   Precuations rev'd    Medication List     As of 03/24/2012  3:15 PM    CONTINUE taking these medications         prenatal multivitamin Tabs            Follow-up Information    Follow up with [facility]. In 2 weeks. (for repeat labs)    Contact information:   [address] [place] [address] [phone]            FRAZIER,NATALIE 03/24/2012, 12:22 PM

## 2012-03-27 ENCOUNTER — Telehealth: Payer: Self-pay | Admitting: General Practice

## 2012-03-27 NOTE — Telephone Encounter (Signed)
Patient called stating she was returning our phone call. Called patient back and she states she was just returning a missed call from [place] and that there was no message left. I told the patient I couldn't see any notes of why we would have called but that it does look like she has an appt on 12/18 at 1pm to have her blood drawn for hcg which is the pregnancy hormone and that the call could be related to that. Patient asked why she would have to have blood work for that. I asked if she had recently miscarried and the patient replied yes. I told her that when someone miscarries they like to do a repeat lab drawn to make sure that the pregnancy hormone is going down normally and not staying elevated. Patient verbalized understanding and had no further questions

## 2012-03-27 NOTE — MAU Provider Note (Signed)
Attestation of Attending Supervision of Advanced Practitioner (CNM/NP): Evaluation and management procedures were performed by the Advanced Practitioner under my supervision and collaboration. I have reviewed the Advanced Practitioner's note and chart, and I agree with the management and plan.  Braxen Dobek H. 9:10 PM   

## 2012-04-10 ENCOUNTER — Other Ambulatory Visit: Payer: Medicaid Other

## 2013-01-25 ENCOUNTER — Encounter (HOSPITAL_COMMUNITY): Payer: Self-pay | Admitting: *Deleted

## 2013-03-03 ENCOUNTER — Emergency Department (HOSPITAL_COMMUNITY): Payer: Medicaid Other

## 2013-03-03 ENCOUNTER — Emergency Department (HOSPITAL_COMMUNITY)
Admission: EM | Admit: 2013-03-03 | Discharge: 2013-03-04 | Disposition: A | Discharge status: Home / Self Care | Payer: Medicaid Other | Attending: Emergency Medicine | Admitting: Emergency Medicine

## 2013-03-03 ENCOUNTER — Encounter (HOSPITAL_COMMUNITY): Payer: Self-pay | Admitting: Emergency Medicine

## 2013-03-03 DIAGNOSIS — Y9389 Activity, other specified: Secondary | ICD-10-CM | POA: Insufficient documentation

## 2013-03-03 DIAGNOSIS — Y92410 Unspecified street and highway as the place of occurrence of the external cause: Secondary | ICD-10-CM | POA: Insufficient documentation

## 2013-03-03 DIAGNOSIS — M549 Dorsalgia, unspecified: Secondary | ICD-10-CM

## 2013-03-03 DIAGNOSIS — S161XXA Strain of muscle, fascia and tendon at neck level, initial encounter: Secondary | ICD-10-CM

## 2013-03-03 DIAGNOSIS — M546 Pain in thoracic spine: Secondary | ICD-10-CM | POA: Insufficient documentation

## 2013-03-03 DIAGNOSIS — Z87891 Personal history of nicotine dependence: Secondary | ICD-10-CM | POA: Insufficient documentation

## 2013-03-03 DIAGNOSIS — Z8619 Personal history of other infectious and parasitic diseases: Secondary | ICD-10-CM | POA: Insufficient documentation

## 2013-03-03 DIAGNOSIS — S139XXA Sprain of joints and ligaments of unspecified parts of neck, initial encounter: Secondary | ICD-10-CM | POA: Insufficient documentation

## 2013-03-03 MED ORDER — CYCLOBENZAPRINE HCL 10 MG PO TABS: 10.0000 mg | ORAL_TABLET | Freq: Two times a day (BID) | ORAL | Status: DC | PRN

## 2013-03-03 MED ORDER — NAPROXEN 500 MG PO TABS: 500.0000 mg | ORAL_TABLET | Freq: Two times a day (BID) | ORAL | Status: DC

## 2013-03-03 NOTE — ED Notes (Signed)
Patient transported to X-ray 
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
## 2013-03-06 NOTE — ED Provider Notes (Signed)
Medical screening examination/treatment/procedure(s) were performed by non-physician practitioner and as supervising physician I was immediately available for consultation/collaboration.  EKG Interpretation   None        Jahaan Vanwagner R. Sharolyn Weber, MD 03/06/13 0722 

## 2013-12-23 ENCOUNTER — Ambulatory Visit (INDEPENDENT_AMBULATORY_CARE_PROVIDER_SITE_OTHER): Payer: 59 | Admitting: Family Medicine

## 2013-12-23 VITALS — BP 122/70 | HR 71 | Temp 98.2°F | Resp 16 | Ht 65.0 in | Wt 127.8 lb

## 2013-12-23 DIAGNOSIS — A499 Bacterial infection, unspecified: Secondary | ICD-10-CM

## 2013-12-23 DIAGNOSIS — N760 Acute vaginitis: Secondary | ICD-10-CM

## 2013-12-23 DIAGNOSIS — N898 Other specified noninflammatory disorders of vagina: Secondary | ICD-10-CM

## 2013-12-23 DIAGNOSIS — Z124 Encounter for screening for malignant neoplasm of cervix: Secondary | ICD-10-CM

## 2013-12-23 DIAGNOSIS — B9689 Other specified bacterial agents as the cause of diseases classified elsewhere: Secondary | ICD-10-CM

## 2013-12-23 DIAGNOSIS — Z0000 Encounter for general adult medical examination without abnormal findings: Secondary | ICD-10-CM

## 2013-12-23 DIAGNOSIS — Z283 Underimmunization status: Secondary | ICD-10-CM

## 2013-12-23 DIAGNOSIS — Z2839 Other underimmunization status: Secondary | ICD-10-CM

## 2013-12-23 DIAGNOSIS — Z13220 Encounter for screening for lipoid disorders: Secondary | ICD-10-CM

## 2013-12-23 LAB — POCT URINALYSIS DIPSTICK
Bilirubin, UA: NEGATIVE
Blood, UA: NEGATIVE
Glucose, UA: NEGATIVE
Leukocytes, UA: NEGATIVE
Nitrite, UA: NEGATIVE
Protein, UA: NEGATIVE
Spec Grav, UA: 1.015
Urobilinogen, UA: 0.2
pH, UA: 7.5

## 2013-12-23 LAB — POCT CBC
Granulocyte percent: 50 %G (ref 37–80)
HCT, POC: 41.8 % (ref 37.7–47.9)
Hemoglobin: 13.4 g/dL (ref 12.2–16.2)
Lymph, poc: 4.2 — AB (ref 0.6–3.4)
MCH, POC: 29.9 pg (ref 27–31.2)
MCHC: 32.1 g/dL (ref 31.8–35.4)
MCV: 93.1 fL (ref 80–97)
MID (cbc): 0.8 (ref 0–0.9)
MPV: 9.2 fL (ref 0–99.8)
POC Granulocyte: 4.9 (ref 2–6.9)
POC LYMPH PERCENT: 42.4 % (ref 10–50)
POC MID %: 7.6 % (ref 0–12)
Platelet Count, POC: 174 10*3/uL (ref 142–424)
RBC: 4.49 M/uL (ref 4.04–5.48)
RDW, POC: 12.4 %
WBC: 9.9 10*3/uL (ref 4.6–10.2)

## 2013-12-23 LAB — POCT WET PREP WITH KOH
KOH Prep POC: NEGATIVE
RBC Wet Prep HPF POC: NEGATIVE
Trichomonas, UA: NEGATIVE
Yeast Wet Prep HPF POC: NEGATIVE

## 2013-12-23 LAB — POCT UA - MICROSCOPIC ONLY
Amorphous, UA: POSITIVE
Bacteria, U Microscopic: NEGATIVE
Casts, Ur, LPF, POC: NEGATIVE
Crystals, Ur, HPF, POC: NEGATIVE
Mucus, UA: NEGATIVE
RBC, urine, microscopic: NEGATIVE
Yeast, UA: NEGATIVE

## 2013-12-23 LAB — POCT URINE PREGNANCY: Preg Test, Ur: NEGATIVE

## 2013-12-23 MED ORDER — METRONIDAZOLE 500 MG PO TABS: 500.0000 mg | ORAL_TABLET | Freq: Two times a day (BID) | ORAL | Status: DC

## 2013-12-23 NOTE — Patient Instructions (Signed)
Bacterial Vaginosis Bacterial vaginosis is a vaginal infection that occurs when the normal balance of bacteria in the vagina is disrupted. It results from an overgrowth of certain bacteria. This is the most common vaginal infection in women of childbearing age. Treatment is important to prevent complications, especially in pregnant women, as it can cause a premature delivery. CAUSES  Bacterial vaginosis is caused by an increase in harmful bacteria that are normally present in smaller amounts in the vagina. Several different kinds of bacteria can cause bacterial vaginosis. However, the reason that the condition develops is not fully understood. RISK FACTORS Certain activities or behaviors can put you at an increased risk of developing bacterial vaginosis, including:  Having a new sex partner or multiple sex partners.  Douching.  Using an intrauterine device (IUD) for contraception. Women do not get bacterial vaginosis from toilet seats, bedding, swimming pools, or contact with objects around them. SIGNS AND SYMPTOMS  Some women with bacterial vaginosis have no signs or symptoms. Common symptoms include:  Grey vaginal discharge.  A fishlike odor with discharge, especially after sexual intercourse.  Itching or burning of the vagina and vulva.  Burning or pain with urination. DIAGNOSIS  Your health care provider will take a medical history and examine the vagina for signs of bacterial vaginosis. A sample of vaginal fluid may be taken. Your health care provider will look at this sample under a microscope to check for bacteria and abnormal cells. A vaginal pH test may also be done.  TREATMENT  Bacterial vaginosis may be treated with antibiotic medicines. These may be given in the form of a pill or a vaginal cream. A second round of antibiotics may be prescribed if the condition comes back after treatment.  HOME CARE INSTRUCTIONS   Only take over-the-counter or prescription medicines as  directed by your health care provider.  If antibiotic medicine was prescribed, take it as directed. Make sure you finish it even if you start to feel better.  Do not have sex until treatment is completed.  Tell all sexual partners that you have a vaginal infection. They should see their health care provider and be treated if they have problems, such as a mild rash or itching.  Practice safe sex by using condoms and only having one sex partner. SEEK MEDICAL CARE IF:   Your symptoms are not improving after 3 days of treatment.  You have increased discharge or pain.  You have a fever. MAKE SURE YOU:   Understand these instructions.  Will watch your condition.  Will get help right away if you are not doing well or get worse. FOR MORE INFORMATION  Centers for Disease Control and Prevention, Division of STD Prevention: www.cdc.gov/std American Sexual Health Association (ASHA): www.ashastd.org  Document Released: 04/10/2005 Document Revised: 01/29/2013 Document Reviewed: 11/20/2012 [employer] Patient Information 2015 [employer]. This information is not intended to replace advice given to you by your health care provider. Make sure you discuss any questions you have with your health care provider.  

## 2013-12-23 NOTE — Progress Notes (Signed)
Chief Complaint:  Chief Complaint  Patient presents with  . Annual Exam    w/pap     HPI: Morgan Galloway is a 23 y.o. 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 She needs titers.  She is G3A3L0, The cord wrapped around one fetus' neck, the other 2 were just early and stillborn. She never ahd a work up as to why she was not able to successfully carry  Menarche 6 th grade, 28 dayls, cycle are normal, lasts 4-5 days.  She does not do breast exams regular Maternal GM  Dx with breast cancer 23 y.o No family hx cevical , uterine cancer STDs-she has had gonorrhea and chlamydia, > 4 yearsa go, was treated for it.  LMP August 18 , she is not on birth control, she does not use condoms with her husband, she states that if she gets pregnant then she will not be surprised.  She has already gotten her PPD from anther place She ahs ahd abnormal paps, she thinks she may have had a colpo   Past Medical History  Diagnosis Date  . History of stillbirth     June 2012  . Gonorrhea   . Depression    Past Surgical History  Procedure Laterality Date  . No past surgeries     History   Social History  . Marital Status: Married    Spouse Name: N/A    Number of Children: N/A  . Years of Education: N/A   Social History Main Topics  . Smoking status: Former Smoker -- 0.50 packs/day    Types: Cigarettes  . Smokeless tobacco: Never Used  . Alcohol Use: Yes  . Drug Use: No  . Sexual Activity: Yes    Birth Control/ Protection: None   Other Topics Concern  . None   Social History Narrative  . None   Family History  Problem Relation Age of Onset  . Other Neg Hx   . Cancer Maternal Grandmother   . Stroke Father    No Known Allergies Prior to Admission medications   Not on File     ROS: The patient denies fevers, chills, night sweats, unintentional weight loss, chest pain, palpitations, wheezing, dyspnea  on exertion, nausea, vomiting, abdominal pain, dysuria, hematuria, melena, numbness, weakness, or tingling.   All other systems have been reviewed and were otherwise negative with the exception of those mentioned in the HPI and as above.    PHYSICAL EXAM: Filed Vitals:   12/23/13 1648  BP: 122/70  Pulse: 71  Temp: 98.2 F (36.8 C)  Resp: 16   Filed Vitals:   12/23/13 1648  Height: '5\' 5"'  (1.651 m)  Weight: 127 lb 12.8 oz (57.97 kg)   Body mass index is 21.27 kg/(m^2).  General: Alert, no acute distress HEENT:  Normocephalic, atraumatic, oropharynx patent. EOMI, PERRLA Cardiovascular:  Regular rate and rhythm, no rubs murmurs or gallops.  No Carotid bruits, radial pulse intact. No pedal edema.  Respiratory: Clear to auscultation bilaterally.  No wheezes, rales, or rhonchi.  No cyanosis, no use of accessory musculature GI: No organomegaly, abdomen is soft and non-tender, positive bowel sounds.  No masses. Skin: No rashes. Neurologic: Facial musculature symmetric. Psychiatric: Patient is appropriate throughout our interaction. Lymphatic: No cervical lymphadenopathy Musculoskeletal: Gait intact. BReast And GU exam mostly normal-no massess/lesions, no CMT, except did have White dc   LABS: Results  for orders placed in visit on 12/23/13  POCT CBC      Result Value Ref Range   WBC 9.9  4.6 - 10.2 K/uL   Lymph, poc 4.2 (*) 0.6 - 3.4   POC LYMPH PERCENT 42.4  10 - 50 %L   MID (cbc) 0.8  0 - 0.9   POC MID % 7.6  0 - 12 %M   POC Granulocyte 4.9  2 - 6.9   Granulocyte percent 50.0  37 - 80 %G   RBC 4.49  4.04 - 5.48 M/uL   Hemoglobin 13.4  12.2 - 16.2 g/dL   HCT, POC 41.8  37.7 - 47.9 %   MCV 93.1  80 - 97 fL   MCH, POC 29.9  27 - 31.2 pg   MCHC 32.1  31.8 - 35.4 g/dL   RDW, POC 12.4     Platelet Count, POC 174  142 - 424 K/uL   MPV 9.2  0 - 99.8 fL  POCT UA - MICROSCOPIC ONLY      Result Value Ref Range   WBC, Ur, HPF, POC 0-1     RBC, urine, microscopic neg     Bacteria,  U Microscopic neg     Mucus, UA neg     Epithelial cells, urine per micros 3-8     Crystals, Ur, HPF, POC neg     Casts, Ur, LPF, POC neg     Yeast, UA neg     Amorphous, UA positive    POCT URINALYSIS DIPSTICK      Result Value Ref Range   Color, UA yellow     Clarity, UA sl cloudy     Glucose, UA neg     Bilirubin, UA neg     Ketones, UA trace     Spec Grav, UA 1.015     Blood, UA neg     pH, UA 7.5     Protein, UA neg     Urobilinogen, UA 0.2     Nitrite, UA neg     Leukocytes, UA Negative    POCT WET PREP WITH KOH      Result Value Ref Range   Trichomonas, UA Negative     Clue Cells Wet Prep HPF POC 8-10     Epithelial Wet Prep HPF POC 10-15     Yeast Wet Prep HPF POC neg     Bacteria Wet Prep HPF POC 1+     RBC Wet Prep HPF POC neg     WBC Wet Prep HPF POC 1-3     KOH Prep POC Negative    POCT URINE PREGNANCY      Result Value Ref Range   Preg Test, Ur Negative       EKG/XRAY:   Primary read interpreted by Dr. Marin Comment at [facility].   ASSESSMENT/PLAN: Encounter Diagnoses  Name Primary?  . Annual physical exam Yes  . Screening for cervical cancer   . Screening for hyperlipidemia   . Vaginal discharge   . Immunization deficiency   . Bacterial vaginosis    Annual labs and pap pending Will await for Titier: MMR , He p B, and Varicella Will call with results in 48-72 hours, if she needs anything will let her know, she has to turn the forms in by tomorrow. I advise her that the titers will probably not come in time. She should be ok, next time do this at least 2-4 weeks in advance Rx Falgyl for BV  for 7 days BID Advise that if she is not using condoms and is ok with gettign pregnant then need to be on prenatal vitamin  F/u prn  Gross sideeffects, risk and benefits, and alternatives of medications d/w patient. Patient is aware that all medications have potential sideeffects and we are unable to predict every sideeffect or drug-drug interaction that may occur.  Morgan Galloway, [place], DO 12/23/2013 7:02 PM

## 2013-12-24 LAB — COMPLETE METABOLIC PANEL WITHOUT GFR
AST: 16 U/L (ref 0–37)
Albumin: 4.8 g/dL (ref 3.5–5.2)
Alkaline Phosphatase: 62 U/L (ref 39–117)
BUN: 11 mg/dL (ref 6–23)
Potassium: 3.9 meq/L (ref 3.5–5.3)

## 2013-12-24 LAB — COMPLETE METABOLIC PANEL WITH GFR
ALT: 8 U/L (ref 0–35)
CO2: 27 mEq/L (ref 19–32)
Calcium: 9.6 mg/dL (ref 8.4–10.5)
Chloride: 100 mEq/L (ref 96–112)
Creat: 0.85 mg/dL (ref 0.50–1.10)
GFR, Est African American: >89 mL/min
GFR, Est Non African American: >89 mL/min
Glucose, Bld: 81 mg/dL (ref 70–99)
Sodium: 135 mEq/L (ref 135–145)
Total Bilirubin: 0.4 mg/dL (ref 0.2–1.2)
Total Protein: 7.7 g/dL (ref 6.0–8.3)

## 2013-12-24 LAB — TSH: TSH: 1.363 u[IU]/mL (ref 0.350–4.500)

## 2013-12-24 LAB — HEPATITIS B SURFACE ANTIBODY, QUANTITATIVE: Hep B S AB Quant (Post): 388 m[IU]/mL

## 2013-12-24 LAB — LDL CHOLESTEROL, DIRECT: Direct LDL: 94 mg/dL

## 2013-12-25 ENCOUNTER — Encounter: Payer: Self-pay | Admitting: Family Medicine

## 2013-12-25 LAB — MEASLES/MUMPS/RUBELLA IMMUNITY
Mumps IgG: 264 [AU]/ml — ABNORMAL HIGH (ref <9.00)
Rubella: 2.73 {index} — ABNORMAL HIGH (ref <0.90)
Rubeola IgG: >300 AU/mL — ABNORMAL HIGH (ref <25.00)

## 2013-12-25 LAB — VARICELLA ZOSTER ANTIBODY, IGG: Varicella IgG: 2543 Index — ABNORMAL HIGH (ref <135.00)

## 2013-12-26 LAB — PAP IG, CT-NG, RFX HPV ASCU
Chlamydia Probe Amp: NEGATIVE
GC Probe Amp: NEGATIVE

## 2014-01-05 ENCOUNTER — Encounter: Payer: Self-pay | Admitting: Family Medicine

## 2014-01-05 ENCOUNTER — Ambulatory Visit (INDEPENDENT_AMBULATORY_CARE_PROVIDER_SITE_OTHER): Payer: 59 | Admitting: Family Medicine

## 2014-01-05 VITALS — BP 118/70 | HR 73 | Temp 98.7°F | Resp 16 | Ht 66.5 in | Wt 128.0 lb

## 2014-01-05 DIAGNOSIS — N96 Recurrent pregnancy loss: Secondary | ICD-10-CM

## 2014-01-05 DIAGNOSIS — F3289 Other specified depressive episodes: Secondary | ICD-10-CM

## 2014-01-05 DIAGNOSIS — Z3009 Encounter for other general counseling and advice on contraception: Secondary | ICD-10-CM

## 2014-01-05 DIAGNOSIS — F329 Major depressive disorder, single episode, unspecified: Secondary | ICD-10-CM

## 2014-01-05 DIAGNOSIS — F32A Depression, unspecified: Secondary | ICD-10-CM

## 2014-01-05 NOTE — Patient Instructions (Signed)
1.  CONSIDER CONTACTING HUMAN [employer] AT WORK REGARDING COUNSELING.

## 2014-01-05 NOTE — Progress Notes (Signed)
Subjective:    Patient ID: Morgan Galloway, female    DOB: [birth year], 23 y.o.   MRN: [id number]  01/05/2014  Establish Care   HPI This 23 y.o. female presents to establish care.  Last physical: 12/23/2013. Colonoscopy:  never TDAP:  2008 Pap smear normal in 2015.   Mammogram never Gardisil 2007 and 2008 Influenza:  At school. Eye exam:  Never; no glasses or contacts. Dental exam:  none  Regular menses (monthly; 4 days; not heavy; rare cramping).  SAB x 3; stillbirth at 6 months; SAB x 2 early (less than 8 weeks).  Has not followed up with gynecology to evaluate SAB and stillbirth. Developed some depression with SAB and stillbirth.  Husband is ready to try and conceive again but patient not ready; pt wants to finish RN schooling.  Husband is older at age 39; husband also with prostate issues so he really wants to proceed.  Has full custody of 66 year old stepdaughter; pt is angry and resentful of step daughter; very short-tempered with step daughter. Also resentful and angry with stepdaughter's mother for not taking responsibility of child. No previous counseling.  Was maintained on SSRI but weaned off of it due to potential risks to fetus.  Not on contraception currently.  Review of Systems  Constitutional: Negative for fever, chills, diaphoresis and fatigue.  Eyes: Negative for visual disturbance.  Respiratory: Negative for cough and shortness of breath.   Cardiovascular: Negative for chest pain, palpitations and leg swelling.  Gastrointestinal: Negative for nausea, vomiting, abdominal pain, diarrhea and constipation.  Endocrine: Negative for cold intolerance, heat intolerance, polydipsia, polyphagia and polyuria.  Neurological: Negative for dizziness, tremors, seizures, syncope, facial asymmetry, speech difficulty, weakness, light-headedness, numbness and headaches.  Psychiatric/Behavioral: Positive for dysphoric mood. The patient is not nervous/anxious.     Past Medical  History  Diagnosis Date  . History of stillbirth     June 2012  . Gonorrhea   . Depression     Zoloft in past; stopped due to potential of pregnancy.   Past Surgical History  Procedure Laterality Date  . No past surgeries     No Known Allergies Current Outpatient Prescriptions  Medication Sig Dispense Refill  . metroNIDAZOLE (FLAGYL) 500 MG tablet Take 1 tablet (500 mg total) by mouth 2 (two) times daily.  14 tablet  0   No current facility-administered medications for this visit.       Objective:    BP 118/70  Pulse 73  Temp(Src) 98.7 F (37.1 C) (Oral)  Resp 16  Ht 5' 6.5" (1.689 m)  Wt 128 lb (58.06 kg)  BMI 20.35 kg/m2  SpO2 99%  LMP 12/09/2013 Physical Exam  Constitutional: She is oriented to person, place, and time. She appears well-developed and well-nourished. No distress.  HENT:  Head: Normocephalic and atraumatic.  Right Ear: External ear normal.  Left Ear: External ear normal.  Nose: Nose normal.  Mouth/Throat: Oropharynx is clear and moist.  Eyes: Conjunctivae and EOM are normal. Pupils are equal, round, and reactive to light.  Neck: Normal range of motion. Neck supple. Carotid bruit is not present. No thyromegaly present.  Cardiovascular: Normal rate, regular rhythm, normal heart sounds and intact distal pulses.  Exam reveals no gallop and no friction rub.   No murmur heard. Pulmonary/Chest: Effort normal and breath sounds normal. She has no wheezes. She has no rales.  Abdominal: Soft. Bowel sounds are normal. She exhibits no distension and no mass. There is no tenderness. There  is no rebound and no guarding.  Lymphadenopathy:    She has no cervical adenopathy.  Neurological: She is alert and oriented to person, place, and time. No cranial nerve deficit.  Skin: Skin is warm and dry. No rash noted. She is not diaphoretic. No erythema. No pallor.  Psychiatric: She has a normal mood and affect. Her behavior is normal.   Results for orders placed in visit  on 12/23/13  COMPLETE METABOLIC PANEL WITH GFR      Result Value Ref Range   Sodium 135  135 - 145 mEq/L   Potassium 3.9  3.5 - 5.3 mEq/L   Chloride 100  96 - 112 mEq/L   CO2 27  19 - 32 mEq/L   Glucose, Bld 81  70 - 99 mg/dL   BUN 11  6 - 23 mg/dL   Creat 1.61  0.96 - 0.45 mg/dL   Total Bilirubin 0.4  0.2 - 1.2 mg/dL   Alkaline Phosphatase 62  39 - 117 U/L   AST 16  0 - 37 U/L   ALT 8  0 - 35 U/L   Total Protein 7.7  6.0 - 8.3 g/dL   Albumin 4.8  3.5 - 5.2 g/dL   Calcium 9.6  8.4 - 40.9 mg/dL   GFR, Est African American >89     GFR, Est Non African American >89    TSH      Result Value Ref Range   TSH 1.363  0.350 - 4.500 uIU/mL  LDL CHOLESTEROL, DIRECT      Result Value Ref Range   Direct LDL 94    HEPATITIS B SURFACE ANTIBODY, QUANTITATIVE      Result Value Ref Range   Hepatitis B-Post 388.0    MEASLES/MUMPS/RUBELLA IMMUNITY      Result Value Ref Range   Rubella 2.73 (*) <0.90 Index   Mumps IgG 264.00 (*) <9.00 AU/mL   Rubeola IgG >300.00 (*) <25.00 AU/mL  VARICELLA ZOSTER ANTIBODY, IGG      Result Value Ref Range   Varicella IgG 2543.00 (*) <135.00 Index  POCT CBC      Result Value Ref Range   WBC 9.9  4.6 - 10.2 K/uL   Lymph, poc 4.2 (*) 0.6 - 3.4   POC LYMPH PERCENT 42.4  10 - 50 %L   MID (cbc) 0.8  0 - 0.9   POC MID % 7.6  0 - 12 %M   POC Granulocyte 4.9  2 - 6.9   Granulocyte percent 50.0  37 - 80 %G   RBC 4.49  4.04 - 5.48 M/uL   Hemoglobin 13.4  12.2 - 16.2 g/dL   HCT, POC 81.1  91.4 - 47.9 %   MCV 93.1  80 - 97 fL   MCH, POC 29.9  27 - 31.2 pg   MCHC 32.1  31.8 - 35.4 g/dL   RDW, POC 78.2     Platelet Count, POC 174  142 - 424 K/uL   MPV 9.2  0 - 99.8 fL  POCT UA - MICROSCOPIC ONLY      Result Value Ref Range   WBC, Ur, HPF, POC 0-1     RBC, urine, microscopic neg     Bacteria, U Microscopic neg     Mucus, UA neg     Epithelial cells, urine per micros 3-8     Crystals, Ur, HPF, POC neg     Casts, Ur, LPF, POC neg     Yeast, UA neg  Amorphous, UA positive    POCT URINALYSIS DIPSTICK      Result Value Ref Range   Color, UA yellow     Clarity, UA sl cloudy     Glucose, UA neg     Bilirubin, UA neg     Ketones, UA trace     Spec Grav, UA 1.015     Blood, UA neg     pH, UA 7.5     Protein, UA neg     Urobilinogen, UA 0.2     Nitrite, UA neg     Leukocytes, UA Negative    POCT WET PREP WITH KOH      Result Value Ref Range   Trichomonas, UA Negative     Clue Cells Wet Prep HPF POC 8-10     Epithelial Wet Prep HPF POC 10-15     Yeast Wet Prep HPF POC neg     Bacteria Wet Prep HPF POC 1+     RBC Wet Prep HPF POC neg     WBC Wet Prep HPF POC 1-3     KOH Prep POC Negative    POCT URINE PREGNANCY      Result Value Ref Range   Preg Test, Ur Negative    PAP IG, CT-NG, RFX HPV ASCU      Result Value Ref Range   Specimen adequacy: SEE NOTE     FINAL DIAGNOSIS: SEE NOTE     COMMENTS: SEE NOTE     Cytotechnologist: SEE NOTE     QC Cytotechnologist: SEE NOTE     Chlamydia Probe Amp NEGATIVE     GC Probe Amp NEGATIVE         Assessment & Plan:   1. History of multiple miscarriages   2. Depression   3. Family planning counseling    1. History of multiple miscarriages: New to this provider. Recommend establishing with gynecologist to evaluate further. History of stillbirth at 6 months of pregnancy; history of two early first trimester losses.   2.  Depression: improved from onset; highly recommend contacting HR at employment regarding available counseling services.   3. Family planning: highly recommend daily PNV since not preventing pregnancy.  No orders of the defined types were placed in this encounter.    No Follow-up on file.    Nilda Simmer, M.D.  [facility] [address] [place], [place]  [address] [phone] phone [phone] fax

## 2014-01-06 ENCOUNTER — Ambulatory Visit (INDEPENDENT_AMBULATORY_CARE_PROVIDER_SITE_OTHER): Payer: 59 | Admitting: Physician Assistant

## 2014-01-06 ENCOUNTER — Encounter: Payer: Self-pay | Admitting: Family Medicine

## 2014-01-06 ENCOUNTER — Telehealth: Payer: Self-pay | Admitting: *Deleted

## 2014-01-06 VITALS — BP 120/70 | HR 67 | Temp 97.9°F | Resp 16 | Ht 66.5 in | Wt 128.4 lb

## 2014-01-06 DIAGNOSIS — N912 Amenorrhea, unspecified: Secondary | ICD-10-CM

## 2014-01-06 LAB — POCT URINE PREGNANCY: Preg Test, Ur: POSITIVE

## 2014-01-06 NOTE — Addendum Note (Signed)
Addended by: Ethelda Chick on: 01/06/2014 09:16 AM   Modules accepted: Level of Service

## 2014-01-06 NOTE — Telephone Encounter (Signed)
Advised pt to RTC for a confirmation of pregnancy. Pt states she will be in later today.

## 2014-01-06 NOTE — Telephone Encounter (Signed)
Pt. States that she usually sees Dr. Katrinka Blazing. She believes that she is pregnant and would like to know some names of OB/GYN's in the area. Please call [phone]

## 2014-01-06 NOTE — Progress Notes (Signed)
   Subjective:    Patient ID: Morgan Galloway, female    DOB: 1990-06-30, 23 y.o.   MRN: [id number]  HPI  Pt presents to clinic with need for a pregnancy test.  She took one at home and it was positive and she has an appt with Dr Gaynell Face on 9/28 but she needs a pregnancy test result prior to the appoint.  She has had 2 spont. miscarriages prior to 8 weeks and she needs early care.  She has had some EOTH over the past week.  She does not smoke or use illicit drugs.  She was diagnosed with BV at her CPE but she is concerned about the Flagyl with the new pregnancy.  Review of Systems     Objective:   Physical Exam  Vitals reviewed. Constitutional: She is oriented to person, place, and time. She appears well-developed and well-nourished.  HENT:  Head: Normocephalic and atraumatic.  Right Ear: External ear normal.  Left Ear: External ear normal.  Eyes: Conjunctivae are normal.  Pulmonary/Chest: Effort normal.  Neurological: She is alert and oriented to person, place, and time.  Skin: Skin is warm and dry.  Psychiatric: She has a normal mood and affect. Her behavior is normal. Judgment and thought content normal.   Results for orders placed in visit on 01/06/14  POCT URINE PREGNANCY      Result Value Ref Range   Preg Test, Ur Positive         Assessment & Plan:  Absent menses - Plan: POCT urine pregnancy  She should start her PNV.  No more ETOH.  Se will f/u with her appt with Dr Gaynell Face on 9/28.  Benny Lennert PA-C  [facility] 01/06/2014 4:00 PM

## 2014-01-06 NOTE — Patient Instructions (Signed)
Estimated due date 09/14/2013 Pregnancy 4 weeks  Colace - soot softener miralax for constipation -  Start multi-vitamin -prenatal type with folic acid -

## 2014-02-14 ENCOUNTER — Telehealth: Payer: Self-pay

## 2014-02-14 NOTE — Telephone Encounter (Signed)
Pt is pregnant and is experiencing back and neck aches and head congestion and would like to know what she might could take over the counter

## 2014-02-15 NOTE — Telephone Encounter (Signed)
Mucinex is ok to take along with Tylenol.

## 2014-02-16 IMAGING — US US OB TRANSVAGINAL
1 series · 13 of 22 positions shown · non-contrast
Comparison: Early OB ultrasound 12/20/2011.

CLINICAL DATA: 21-year-old G2 P0 AB1, LMP 10/23/2011 ([DATE] weeks 5
days), presenting with vaginal bleeding and cramping.

TRANSVAGINAL OB ULTRASOUND
TECHNIQUE: Transvaginal ultrasound was performed for evaluation of
the gestation as well as the maternal uterus and adnexal regions.

[Series 1: us ob transvaginal · 22 acquisitions, 13 frames shown]
[im 1/22]
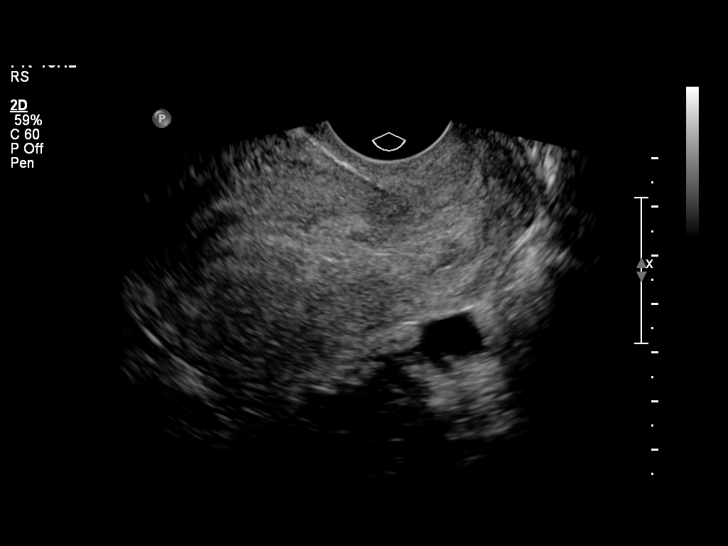
[im 3/22]
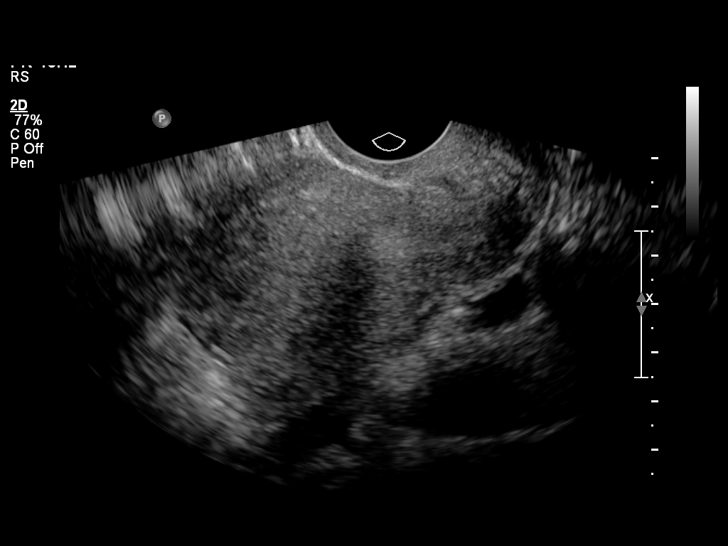
[im 5/22]
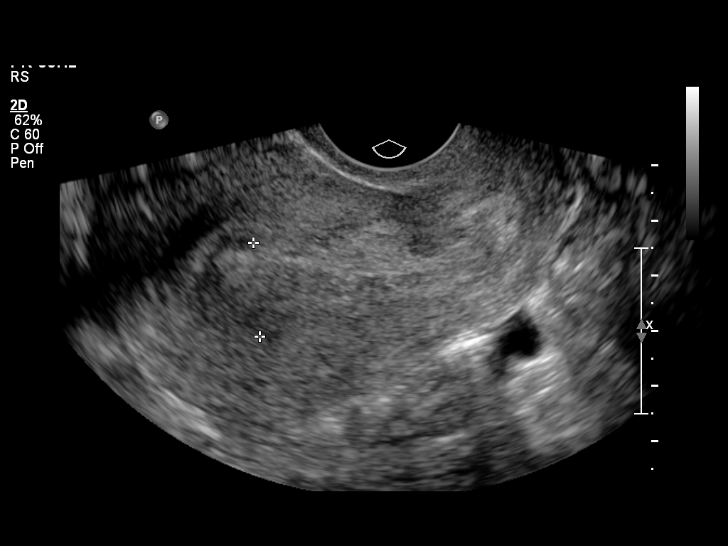
[im 6/22]
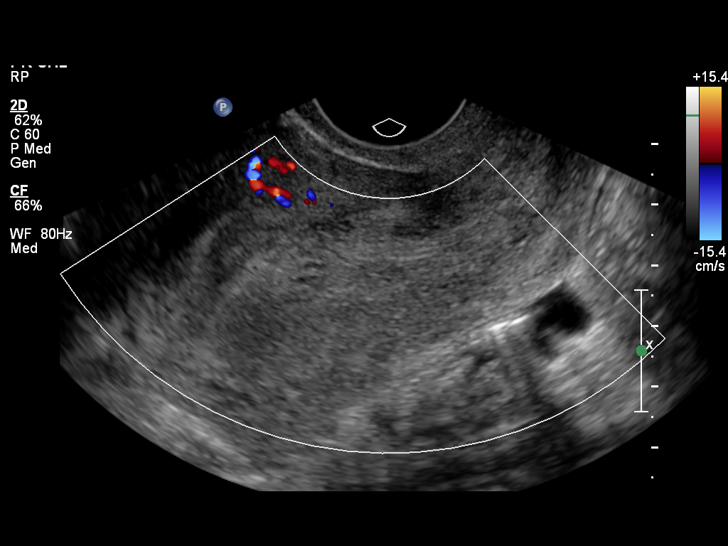
[im 8/22]
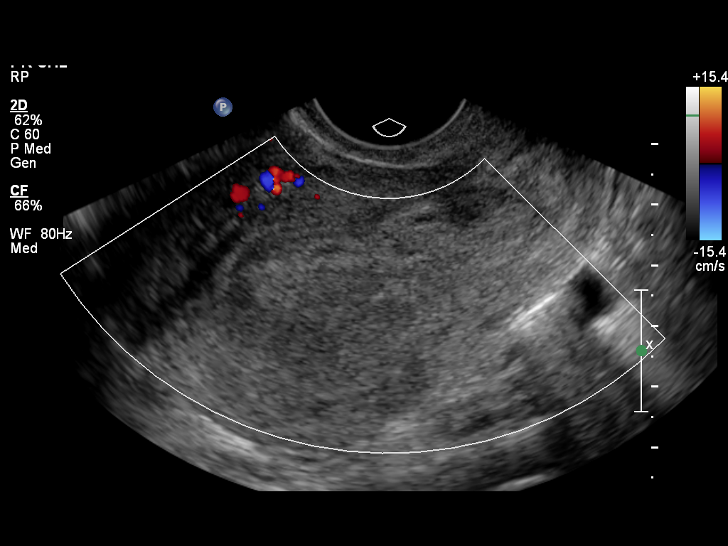
[im 10/22]
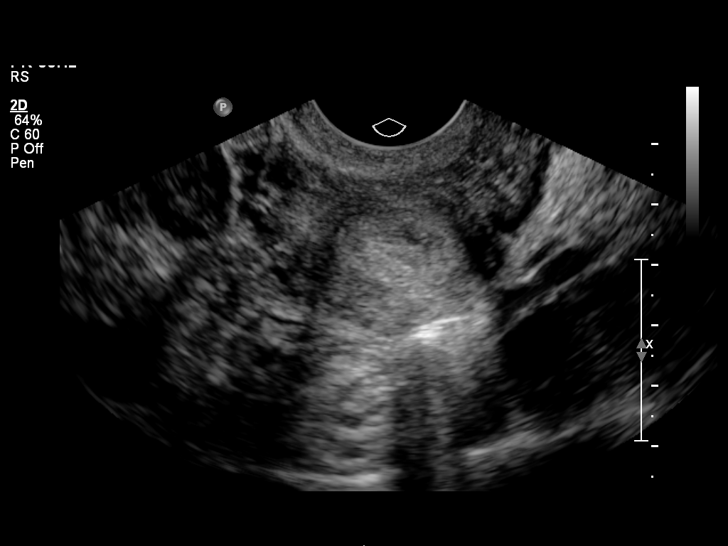
[im 12/22]
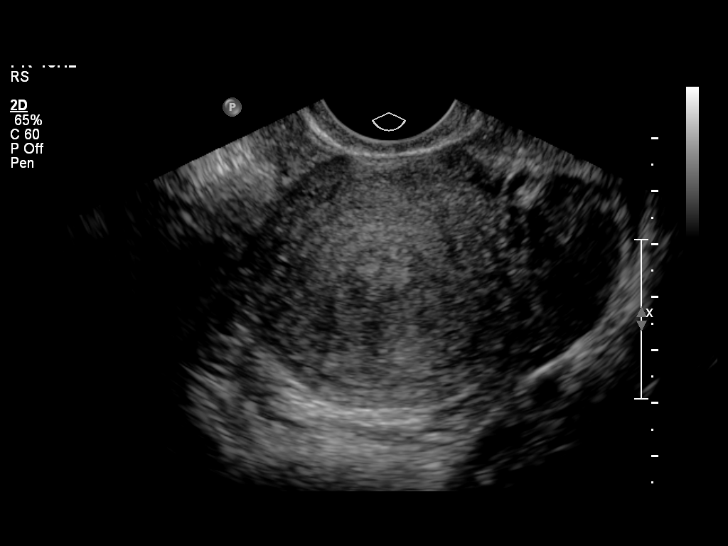
[im 13/22]
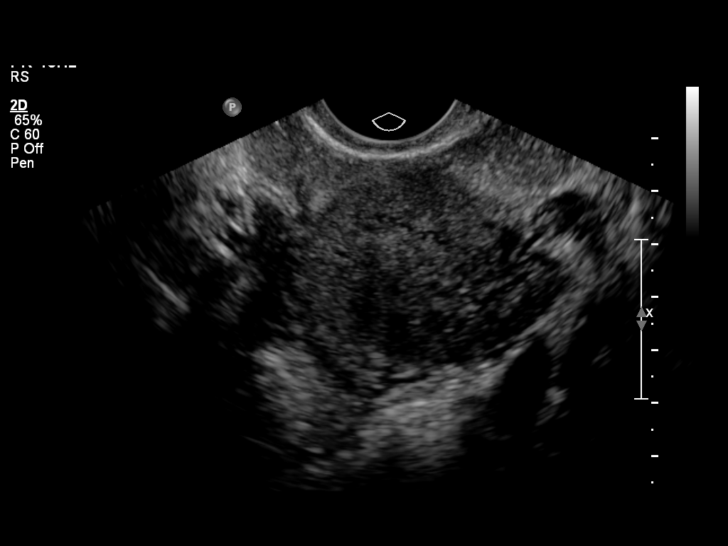
[im 15/22]
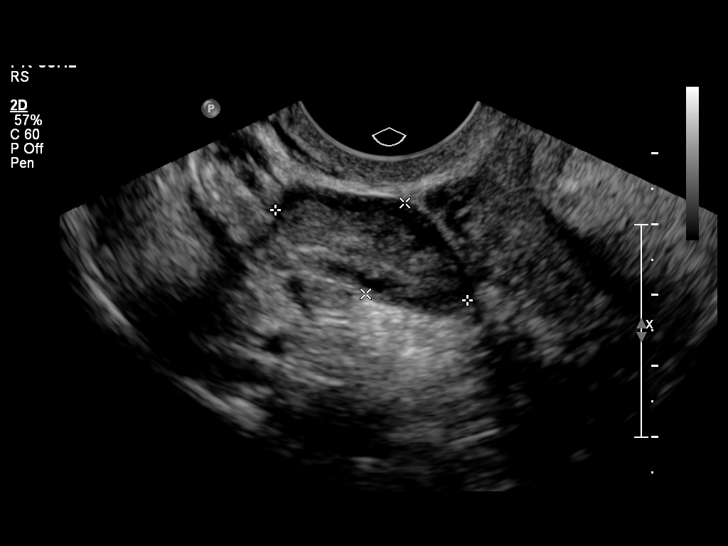
[im 17/22]
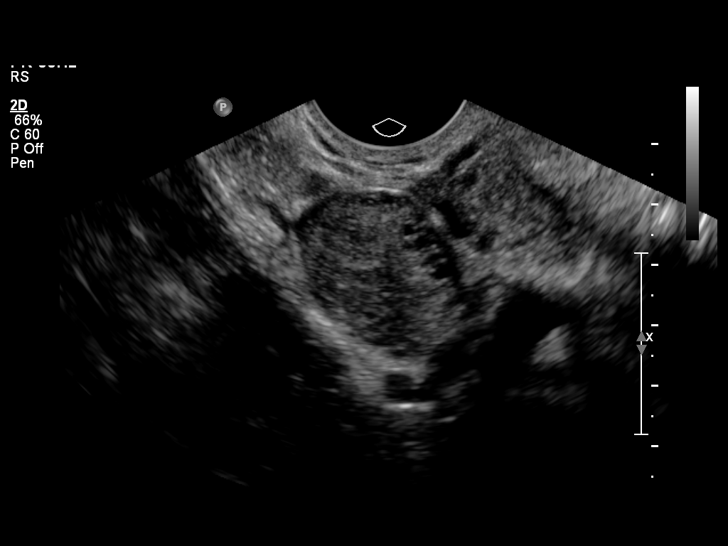
[im 18/22]
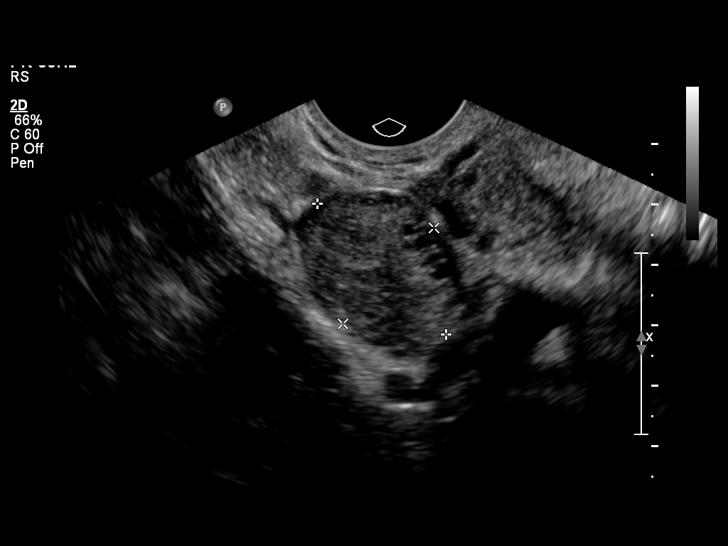
[im 20/22]
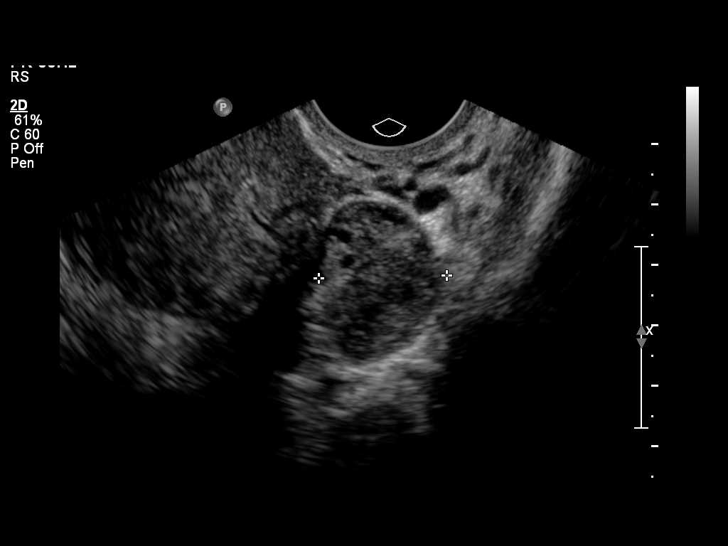
[im 22/22]
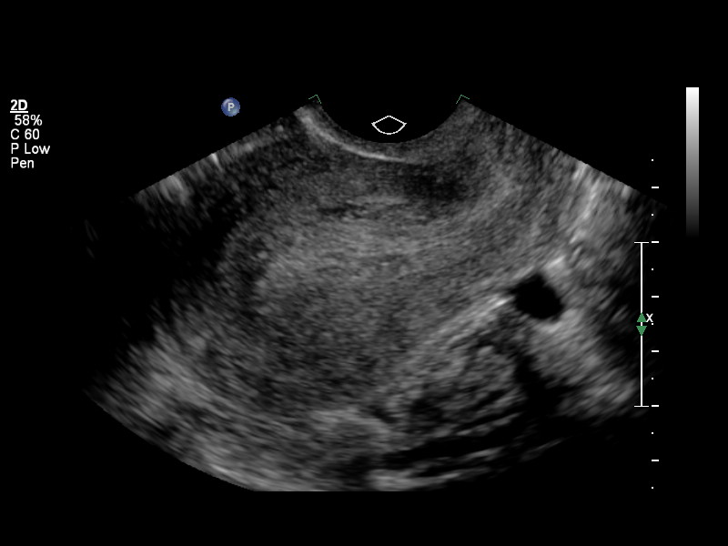

[13 of 22 positions shown; findings below may reference images not displayed]

FINDINGS: Interval evacuation of the intrauterine gestational sac
and embryo identified on the examination 3 days ago.  No remaining
visible gestational sac, yolk sac, or embryo.

Endometrial canal heterogeneously thickened measuring up to
approximately 17 mm.  No color Doppler flow within the thickened
endometrium to confirm retained products.

Both ovaries normal in size appearance, the right measuring
approximately 3.0 x 1.4 x 2.3 cm and the left measuring
approximately 3.0 x 2.2 x 2.1 cm.  Hemorrhagic corpus luteum cyst
noted in the left ovary.  No free pelvic fluid.
IMPRESSION: 1.  Spontaneous abortion.  The gestational sac and embryo
previously identified on the examination 3 days ago has been
evacuated in the interval.
2.  Heterogeneous endometrial thickening, likely related to blood
products.  Lack of Doppler flow within the endometrium suggests
against retained products.
3.  Normal-appearing ovaries.  No adnexal masses or free pelvic
fluid.

## 2014-02-16 NOTE — Telephone Encounter (Signed)
Advised pt ok to take medications. She states she is doing much better today.

## 2014-02-19 LAB — OB RESULTS CONSOLE RPR: RPR: NONREACTIVE

## 2014-02-19 LAB — OB RESULTS CONSOLE GC/CHLAMYDIA
CHLAMYDIA, DNA PROBE: NEGATIVE
GC PROBE AMP, GENITAL: NEGATIVE

## 2014-02-19 LAB — OB RESULTS CONSOLE ABO/RH: RH Type: POSITIVE

## 2014-02-19 LAB — OB RESULTS CONSOLE ANTIBODY SCREEN: Antibody Screen: NEGATIVE

## 2014-02-19 LAB — OB RESULTS CONSOLE RUBELLA ANTIBODY, IGM: Rubella: IMMUNE

## 2014-02-19 LAB — OB RESULTS CONSOLE HIV ANTIBODY (ROUTINE TESTING): HIV: NONREACTIVE

## 2014-02-19 LAB — OB RESULTS CONSOLE HEPATITIS B SURFACE ANTIGEN: Hepatitis B Surface Ag: NEGATIVE

## 2014-04-24 NOTE — L&D Delivery Note (Addendum)
Delivery Note At 6:44 PM a viable female, "Laurann MontanaRicardo, Jr.", was delivered via Vaginal, Spontaneous Delivery (Presentation: Right Occiput Anterior).  APGAR: 9, 9; weight 4 lb 10.1 oz (2100 g).   Placenta status: Intact, Spontaneous Pathology.  Cord: 3 vessels with the following complications: None.  Cord pH: NA  Anesthesia: Epidural  Episiotomy: None Lacerations: Periurethral Suture Repair: None required Est. Blood Loss (mL): 62  Mom to postpartum.  Baby to Couplet care / Skin to Skin. Baby for outpatient circumcision. Placenta to path due to IUGR/abnormal dopplers.  Nigel BridgemanLATHAM, Oliver Neuwirth 08/25/2014, 7:47 PM

## 2014-04-30 ENCOUNTER — Other Ambulatory Visit (HOSPITAL_COMMUNITY): Payer: Self-pay | Admitting: Family Medicine

## 2014-04-30 DIAGNOSIS — O409XX0 Polyhydramnios, unspecified trimester, not applicable or unspecified: Secondary | ICD-10-CM

## 2014-04-30 DIAGNOSIS — O289 Unspecified abnormal findings on antenatal screening of mother: Secondary | ICD-10-CM

## 2014-04-30 DIAGNOSIS — O09292 Supervision of pregnancy with other poor reproductive or obstetric history, second trimester: Secondary | ICD-10-CM

## 2014-05-05 ENCOUNTER — Ambulatory Visit (HOSPITAL_COMMUNITY)
Admission: RE | Admit: 2014-05-05 | Discharge: 2014-05-05 | Disposition: A | Discharge status: Home / Self Care | Payer: Medicaid Other | Source: Ambulatory Visit | Attending: Family Medicine | Admitting: Family Medicine

## 2014-05-05 ENCOUNTER — Encounter (HOSPITAL_COMMUNITY): Payer: Self-pay

## 2014-05-05 DIAGNOSIS — Z3A21 21 weeks gestation of pregnancy: Secondary | ICD-10-CM | POA: Diagnosis not present

## 2014-05-05 DIAGNOSIS — Z36 Encounter for antenatal screening of mother: Secondary | ICD-10-CM | POA: Diagnosis not present

## 2014-05-05 DIAGNOSIS — O402XX0 Polyhydramnios, second trimester, not applicable or unspecified: Secondary | ICD-10-CM | POA: Insufficient documentation

## 2014-05-05 DIAGNOSIS — O402XX1 Polyhydramnios, second trimester, fetus 1: Secondary | ICD-10-CM

## 2014-05-05 DIAGNOSIS — O09299 Supervision of pregnancy with other poor reproductive or obstetric history, unspecified trimester: Secondary | ICD-10-CM | POA: Insufficient documentation

## 2014-05-05 DIAGNOSIS — O09292 Supervision of pregnancy with other poor reproductive or obstetric history, second trimester: Secondary | ICD-10-CM | POA: Insufficient documentation

## 2014-05-05 DIAGNOSIS — O289 Unspecified abnormal findings on antenatal screening of mother: Secondary | ICD-10-CM

## 2014-05-05 DIAGNOSIS — O409XX0 Polyhydramnios, unspecified trimester, not applicable or unspecified: Secondary | ICD-10-CM

## 2014-05-06 ENCOUNTER — Other Ambulatory Visit (HOSPITAL_COMMUNITY): Payer: Self-pay

## 2014-05-27 ENCOUNTER — Inpatient Hospital Stay (HOSPITAL_COMMUNITY)
Admission: AD | Admit: 2014-05-27 | Discharge: 2014-05-27 | Disposition: A | Discharge status: Home / Self Care | Payer: Medicaid Other | Source: Ambulatory Visit | Attending: Obstetrics & Gynecology | Admitting: Obstetrics & Gynecology

## 2014-05-27 ENCOUNTER — Encounter (HOSPITAL_COMMUNITY): Payer: Self-pay | Admitting: *Deleted

## 2014-05-27 DIAGNOSIS — Z3A24 24 weeks gestation of pregnancy: Secondary | ICD-10-CM | POA: Insufficient documentation

## 2014-05-27 DIAGNOSIS — R55 Syncope and collapse: Secondary | ICD-10-CM | POA: Diagnosis not present

## 2014-05-27 DIAGNOSIS — Z823 Family history of stroke: Secondary | ICD-10-CM | POA: Diagnosis not present

## 2014-05-27 DIAGNOSIS — O9989 Other specified diseases and conditions complicating pregnancy, childbirth and the puerperium: Secondary | ICD-10-CM | POA: Diagnosis not present

## 2014-05-27 DIAGNOSIS — O402XX0 Polyhydramnios, second trimester, not applicable or unspecified: Secondary | ICD-10-CM | POA: Diagnosis not present

## 2014-05-27 DIAGNOSIS — Z87891 Personal history of nicotine dependence: Secondary | ICD-10-CM | POA: Diagnosis not present

## 2014-05-27 DIAGNOSIS — R42 Dizziness and giddiness: Secondary | ICD-10-CM | POA: Insufficient documentation

## 2014-05-27 HISTORY — DX: Unspecified infectious disease: B99.9

## 2014-05-27 HISTORY — DX: Unspecified abnormal cytological findings in specimens from vagina: R87.629

## 2014-05-27 HISTORY — DX: Headache, unspecified: R51.9

## 2014-05-27 HISTORY — DX: Headache: R51

## 2014-05-27 LAB — COMPREHENSIVE METABOLIC PANEL
ALBUMIN: 3.7 g/dL (ref 3.5–5.2)
ALT: 14 U/L (ref 0–35)
ANION GAP: 6 (ref 5–15)
AST: 18 U/L (ref 0–37)
Alkaline Phosphatase: 73 U/L (ref 39–117)
BUN: 8 mg/dL (ref 6–23)
CO2: 26 mmol/L (ref 19–32)
Calcium: 9.1 mg/dL (ref 8.4–10.5)
Chloride: 106 mmol/L (ref 96–112)
Creatinine, Ser: 0.59 mg/dL (ref 0.50–1.10)
GFR calc Af Amer: >90 mL/min (ref >90)
GFR calc non Af Amer: >90 mL/min (ref >90)
GLUCOSE: 87 mg/dL (ref 70–99)
Potassium: 3.7 mmol/L (ref 3.5–5.1)
Sodium: 138 mmol/L (ref 135–145)
Total Bilirubin: 0.5 mg/dL (ref 0.3–1.2)
Total Protein: 6.7 g/dL (ref 6.0–8.3)

## 2014-05-27 LAB — LIPASE, BLOOD: Lipase: 24 U/L (ref 11–59)

## 2014-05-27 LAB — CBC
HCT: 33.7 % — ABNORMAL LOW (ref 36.0–46.0)
Hemoglobin: 11.6 g/dL — ABNORMAL LOW (ref 12.0–15.0)
MCH: 31.5 pg (ref 26.0–34.0)
MCHC: 34.4 g/dL (ref 30.0–36.0)
MCV: 91.6 fL (ref 78.0–100.0)
Platelets: 148 10*3/uL — ABNORMAL LOW (ref 150–400)
RBC: 3.68 MIL/uL — AB (ref 3.87–5.11)
RDW: 13 % (ref 11.5–15.5)
WBC: 10.1 10*3/uL (ref 4.0–10.5)

## 2014-05-27 LAB — AMYLASE: Amylase: 84 U/L (ref 0–105)

## 2014-05-27 NOTE — MAU Provider Note (Signed)
Morgan BowenJoshea A Galloway is a 24 y.o. G4P0120 at 24.1 weeks presents to MAU via EMS.  She arrived with a 18g IV and LR.  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 Her instructor told her she was behind her, lowered her to the floor and that she did NOT hit the floor or have any abd tauma. She said she ate a smoothie and toast this morning.   She denies any complication in this pregnancy beside being told she had poly.  She has a history of 3 losses, one at 5 months - [place] x 2 and the other two were prior to 8 weeks. She denies vb, lof or feeling the ctx.   History    O positive  Patient Active Problem List   Diagnosis Date Noted  . Hx of intrauterine fetal death, currently pregnant   . Polyhydramnios   . [redacted] weeks gestation of pregnancy     No chief complaint on file.  HPI  OB History    Gravida Para Term Preterm AB TAB SAB Ectopic Multiple Living   4 1  1 2  2    0      Past Medical History  Diagnosis Date  . History of stillbirth     June 2012  . Gonorrhea   . Depression     Zoloft in past; stopped due to potential of pregnancy.  . Headache   . Infection     UTI  . Vaginal Pap smear, abnormal     f/u ok    Past Surgical History  Procedure Laterality Date  . No past surgeries      Family History  Problem Relation Age of Onset  . Other Neg Hx   . Cancer Maternal Grandmother   . Stroke Father [id number]  . Bipolar disorder Mother   . Bipolar disorder Sister     History  Substance Use Topics  . Smoking status: Former Smoker -- 0.50 packs/day    Types: Cigarettes  . Smokeless tobacco: Never Used     Comment: quit with preg  . Alcohol Use: Yes     Comment: not currently    Allergies: No Known Allergies  Prescriptions prior to admission  Medication Sig Dispense Refill Last Dose  . Prenatal Vit w/Fe-Methylfol-FA (PNV PO) Take by mouth.   05/27/2014 at Unknown time  .  metroNIDAZOLE (FLAGYL) 500 MG tablet Take 1 tablet (500 mg total) by mouth 2 (two) times daily. (Patient not taking: Reported on 05/27/2014) 14 tablet 0 Not Taking    ROS See HPI above, all other systems are negative  Physical Exam   Blood pressure 114/47, pulse 69, temperature 98.3 F (36.8 C), temperature source Oral, resp. rate 17, last menstrual period 12/09/2013, SpO2 100 %.  Physical Exam Ext:  WNL ABD: Soft, non tender to palpation, no rebound or guarding SVE:  C/T/H, no discharge noted    ED Course  Assessment: IUP at  24.1weeks Membranes: intact FHR: 140 + accel, no decel, moderate variability.  Reassuring for GA CTX:  When she arrived at 1020 she did not have any ctx.  At 1050 th ctx increased to q2 minutes x 10 minutes.  She has not had a ctx since 1112   Plan: Labs: CBC, CMP IVF   Tranisha Tissue, CNM, MSN 05/27/2014. 11:31 AM

## 2014-05-27 NOTE — Discharge Instructions (Signed)
Syncope °Syncope is a medical term for fainting or passing out. This means you lose consciousness and drop to the ground. People are generally unconscious for less than 5 minutes. You may have some muscle twitches for up to 15 seconds before waking up and returning to normal. Syncope occurs more often in older adults, but it can happen to anyone. While most causes of syncope are not dangerous, syncope can be a sign of a serious medical problem. It is important to seek medical care.  °CAUSES  °Syncope is caused by a sudden drop in blood flow to the brain. The specific cause is often not determined. Factors that can bring on syncope include: °· Taking medicines that lower blood pressure. °· Sudden changes in posture, such as standing up quickly. °· Taking more medicine than prescribed. °· Standing in one place for too long. °· Seizure disorders. °· Dehydration and excessive exposure to heat. °· Low blood sugar (hypoglycemia). °· Straining to have a bowel movement. °· Heart disease, irregular heartbeat, or other circulatory problems. °· Fear, emotional distress, seeing blood, or severe pain. °SYMPTOMS  °Right before fainting, you may: °· Feel dizzy or light-headed. °· Feel nauseous. °· See all white or all black in your field of vision. °· Have cold, clammy skin. °DIAGNOSIS  °Your health care provider will ask about your symptoms, perform a physical exam, and perform an electrocardiogram (ECG) to record the electrical activity of your heart. Your health care provider may also perform other heart or blood tests to determine the cause of your syncope which may include: °· Transthoracic echocardiogram (TTE). During echocardiography, sound waves are used to evaluate how blood flows through your heart. °· Transesophageal echocardiogram (TEE). °· Cardiac monitoring. This allows your health care provider to monitor your heart rate and rhythm in real time. °· Holter monitor. This is a portable device that records your  heartbeat and can help diagnose heart arrhythmias. It allows your health care provider to track your heart activity for several days, if needed. °· Stress tests by exercise or by giving medicine that makes the heart beat faster. °TREATMENT  °In most cases, no treatment is needed. Depending on the cause of your syncope, your health care provider may recommend changing or stopping some of your medicines. °HOME CARE INSTRUCTIONS °· Have someone stay with you until you feel stable. °· Do not drive, use machinery, or play sports until your health care provider says it is okay. °· Keep all follow-up appointments as directed by your health care provider. °· Lie down right away if you start feeling like you might faint. Breathe deeply and steadily. Wait until all the symptoms have passed. °· Drink enough fluids to keep your urine clear or pale yellow. °· If you are taking blood pressure or heart medicine, get up slowly and take several minutes to sit and then stand. This can reduce dizziness. °SEEK IMMEDIATE MEDICAL CARE IF:  °· You have a severe headache. °· You have unusual pain in the chest, abdomen, or back. °· You are bleeding from your mouth or rectum, or you have black or tarry stool. °· You have an irregular or very fast heartbeat. °· You have pain with breathing. °· You have repeated fainting or seizure-like jerking during an episode. °· You faint when sitting or lying down. °· You have confusion. °· You have trouble walking. °· You have severe weakness. °· You have vision problems. °If you fainted, call your local emergency services (911 in [place] Do not drive   yourself to the hospital.  °MAKE SURE YOU: °· Understand these instructions. °· Will watch your condition. °· Will get help right away if you are not doing well or get worse. °Document Released: 04/10/2005 Document Revised: 04/15/2013 Document Reviewed: 06/09/2011 °ExitCare® Patient Information ©2015 [employer]. This information is not intended to replace  advice given to you by your health care provider. Make sure you discuss any questions you have with your health care provider. ° °

## 2014-05-27 NOTE — MAU Provider Note (Signed)
MAU Addendum Note  Results for orders placed or performed during the hospital encounter of 05/27/14 (from the past 24 hour(s))  CBC     Status: Abnormal   Collection Time: 05/27/14 12:00 PM  Result Value Ref Range   WBC 10.1 4.0 - 10.5 K/uL   RBC 3.68 (L) 3.87 - 5.11 MIL/uL   Hemoglobin 11.6 (L) 12.0 - 15.0 g/dL   HCT 16.133.7 (L) 09.636.0 - 04.546.0 %   MCV 91.6 78.0 - 100.0 fL   MCH 31.5 26.0 - 34.0 pg   MCHC 34.4 30.0 - 36.0 g/dL   RDW 40.913.0 81.111.5 - 91.415.5 %   Platelets 148 (L) 150 - 400 K/uL  Comprehensive metabolic panel     Status: None   Collection Time: 05/27/14 12:00 PM  Result Value Ref Range   Sodium 138 135 - 145 mmol/L   Potassium 3.7 3.5 - 5.1 mmol/L   Chloride 106 96 - 112 mmol/L   CO2 26 19 - 32 mmol/L   Glucose, Bld 87 70 - 99 mg/dL   BUN 8 6 - 23 mg/dL   Creatinine, Ser 7.820.59 0.50 - 1.10 mg/dL   Calcium 9.1 8.4 - 95.610.5 mg/dL   Total Protein 6.7 6.0 - 8.3 g/dL   Albumin 3.7 3.5 - 5.2 g/dL   AST 18 0 - 37 U/L   ALT 14 0 - 35 U/L   Alkaline Phosphatase 73 39 - 117 U/L   Total Bilirubin 0.5 0.3 - 1.2 mg/dL   GFR calc non Af Amer >90 >90 mL/min   GFR calc Af Amer >90 >90 mL/min   Anion gap 6 5 - 15  FHT 135-140, reassurring for GA Uterine irritibility Suspected dehydration  Plan: -Discussed need to follow up in office next week  -Bleeding and PTL Precautions -encourage to hydrate -Encouraged to call if any questions or concerns arise prior to next scheduled office visit.  -Discharged to home in stable condition Consulted with Dr. Burna FortsKulwa   Donnamarie Shankles, CNM, MSN 05/27/2014. 2:03 PM

## 2014-05-27 NOTE — MAU Note (Signed)
Was taking a test, became hot, started breathing fast, stood up, fell out.  Teacher was behind her and lowered her to the floor. (per EMS).  ~ 30sec loc.  Pt states still feels a little dizzy.

## 2014-05-27 NOTE — Progress Notes (Signed)
Has ambulated to BR X 2 and has not experienced dizziness or any other adverse effects.

## 2014-05-27 NOTE — Progress Notes (Signed)
1000cc LR connected to saline lock that was inserted by EMS prior to arrival. Bolus infusing.

## 2014-05-27 NOTE — Progress Notes (Signed)
IV d/c'd with catheter intact by V. Standard, CNM

## 2014-06-02 ENCOUNTER — Ambulatory Visit (HOSPITAL_COMMUNITY): Admission: RE | Admit: 2014-06-02 | Payer: Medicaid Other | Source: Ambulatory Visit

## 2014-07-01 ENCOUNTER — Inpatient Hospital Stay (HOSPITAL_COMMUNITY)
Admission: AD | Admit: 2014-07-01 | Discharge: 2014-07-01 | Disposition: A | Discharge status: Home / Self Care | Payer: Medicaid Other | Source: Ambulatory Visit | Attending: Obstetrics and Gynecology | Admitting: Obstetrics and Gynecology

## 2014-07-01 ENCOUNTER — Encounter (HOSPITAL_COMMUNITY): Payer: Self-pay | Admitting: *Deleted

## 2014-07-01 DIAGNOSIS — Z3A29 29 weeks gestation of pregnancy: Secondary | ICD-10-CM | POA: Insufficient documentation

## 2014-07-01 DIAGNOSIS — Z87891 Personal history of nicotine dependence: Secondary | ICD-10-CM | POA: Diagnosis not present

## 2014-07-01 DIAGNOSIS — O4703 False labor before 37 completed weeks of gestation, third trimester: Secondary | ICD-10-CM

## 2014-07-01 LAB — URINALYSIS, ROUTINE W REFLEX MICROSCOPIC
Bilirubin Urine: NEGATIVE
Glucose, UA: NEGATIVE mg/dL
Hgb urine dipstick: NEGATIVE
Ketones, ur: NEGATIVE mg/dL
LEUKOCYTES UA: NEGATIVE
NITRITE: NEGATIVE
PROTEIN: NEGATIVE mg/dL
Specific Gravity, Urine: 1.01 (ref 1.005–1.030)
Urobilinogen, UA: 0.2 mg/dL (ref 0.0–1.0)
pH: 7 (ref 5.0–8.0)

## 2014-07-01 LAB — FETAL FIBRONECTIN: Fetal Fibronectin: NEGATIVE

## 2014-07-01 NOTE — MAU Provider Note (Signed)
Chief Complaint:  Contractions   First Provider Initiated Contact with Patient 07/01/14 0344      HPI: Morgan Galloway is a 24 y.o. G4P0120 at 23w1dwho presents to maternity admissions reporting cramping/contractions off and on since 10:30 pm last night.  She indicates that the cramps are not painful, but started as infrequent, 1-2/hour, then increased to every 10 minutes, then went back to infrequent, and then back to every 3-5 minutes.  She reports drinking her usual 1/2 gallon of water today. She reports good fetal movement, denies LOF, vaginal bleeding, vaginal itching/burning/change in discharge, urinary symptoms, h/a, dizziness, n/v, or fever/chills.    Of note, she had IUFD at 24 weeks with prior pregnancy in 2012.  She was diagnosed with polyhydramnios in her current pregnancy which resolved per pt on follow up ultrasounds in the office.  Past Medical History: Past Medical History  Diagnosis Date  . History of stillbirth     June 2012  . Gonorrhea   . Depression     Zoloft in past; stopped due to potential of pregnancy.  . Headache   . Infection     UTI  . Vaginal Pap smear, abnormal     f/u ok    Past obstetric history: OB History  Gravida Para Term Preterm AB SAB TAB Ectopic Multiple Living  0    # Outcome Date GA Lbr Len/2nd Weight Sex Delivery Anes PTL Lv  4 Current           3 SAB 2013 [redacted]w[redacted]d            Comments: System Generated. Please review and update pregnancy details.  2 Preterm 2012 [redacted]w[redacted]d       ND  1 SAB               Past Surgical History: Past Surgical History  Procedure Laterality Date  . No past surgeries      Family History: Family History  Problem Relation Age of Onset  . Other Neg Hx   . Cancer Maternal Grandmother   . Stroke Father 4  . Bipolar disorder Mother   . Bipolar disorder Sister     Social History: History  Substance Use Topics  . Smoking status: Former Smoker -- 0.50 packs/day    Types: Cigarettes  .  Smokeless tobacco: Never Used     Comment: quit with preg  . Alcohol Use: No     Comment: not currently    Allergies: No Known Allergies  Meds:  Prescriptions prior to admission  Medication Sig Dispense Refill Last Dose  . calcium carbonate (TUMS - DOSED IN MG ELEMENTAL CALCIUM) 500 MG chewable tablet Chew 1 tablet by mouth daily.     . polyethylene glycol (MIRALAX / GLYCOLAX) packet Take 17 g by mouth daily.     . metroNIDAZOLE (FLAGYL) 500 MG tablet Take 1 tablet (500 mg total) by mouth 2 (two) times daily. (Patient not taking: Reported on 05/27/2014) 14 tablet 0 Not Taking  . Prenatal Vit w/Fe-Methylfol-FA (PNV PO) Take by mouth.   05/27/2014 at Unknown time    ROS: Pertinent findings in history of present illness.  Physical Exam  Blood pressure 118/68, pulse 98, temperature 98.7 F (37.1 C), temperature source Oral, resp. rate 18, height  (1.626 m), weight 64.467 kg (142 lb 2 oz), last menstrual period 12/09/2013, SpO2 100 %. GENERAL: Well-developed, well-nourished female in no acute distress.  HEENT: normocephalic HEART:  normal rate RESP: normal effort ABDOMEN: Soft, non-tender, gravid appropriate for gestational age EXTREMITIES: Nontender, no edema NEURO: alert and oriented Pelvic exam: Cervix pink, visually closed, without lesion, scant white creamy discharge, vaginal walls and external genitalia normal  Dilation: 1 Effacement (%): Thick Cervical Position: Posterior Exam by:: L Leftwich-Kirby CNM  FHT:  Baseline 135, moderate variability, accelerations present (15x15), no decelerations Contractions: q 3-4 mins, mild to palpation   Labs: Results for orders placed or performed during the hospital encounter of 07/01/14 (from the past 24 hour(s))  Urinalysis, Routine w reflex microscopic     Status: Abnormal   Collection Time: 07/01/14  2:44 AM  Result Value Ref Range   Color, Urine YELLOW YELLOW   APPearance CLOUDY (A) CLEAR   Specific Gravity, Urine 1.010 1.005 -  1.030   pH 7.0 5.0 - 8.0   Glucose, UA NEGATIVE NEGATIVE mg/dL   Hgb urine dipstick NEGATIVE NEGATIVE   Bilirubin Urine NEGATIVE NEGATIVE   Ketones, ur NEGATIVE NEGATIVE mg/dL   Protein, ur NEGATIVE NEGATIVE mg/dL   Urobilinogen, UA 0.2 0.0 - 1.0 mg/dL   Nitrite NEGATIVE NEGATIVE   Leukocytes, UA NEGATIVE NEGATIVE  Fetal fibronectin     Status: None   Collection Time: 07/01/14  3:40 AM  Result Value Ref Range   Fetal Fibronectin NEGATIVE NEGATIVE    ED Course NST, PO fluids given  Assessment: 1. Threatened preterm labor, third trimester     Plan: Consult Dr Estanislado Pandyivard Discharge home PTL precautions and fetal kick counts      Follow-up Information    [facility].   Specialty:  Obstetrics and Gynecology   Why:  As scheduled or call if symptoms persist   Contact information:   [address] [place] [id number] [phone]      [facility]   Why:  As needed for emergencies   Contact information:   [address] [id number] [address] [place] [id number] [phone]       Medication List    STOP taking these medications        metroNIDAZOLE 500 MG tablet  Commonly known as:  FLAGYL      TAKE these medications        calcium carbonate 500 MG chewable tablet  Commonly known as:  TUMS - dosed in mg elemental calcium  Chew 1 tablet by mouth daily.     PNV PO  Take by mouth.     polyethylene glycol packet  Commonly known as:  MIRALAX / GLYCOLAX  Take 17 g by mouth daily.        Sharen CounterLisa Leftwich-Kirby Certified Nurse-Midwife 07/01/2014 5:03 AM

## 2014-07-01 NOTE — MAU Note (Signed)
J Emly CNM called back and patient to be seen by MAU provider. L Leftwich-Kirby CNM given report and in to see patient.

## 2014-07-01 NOTE — MAU Note (Signed)
Pt states that she began to have regular tightnings tonight and wanted to make sure she was not having preterm labor. Pt denies leaking of fluid and bleeding, baby is active.

## 2014-07-01 NOTE — Discharge Instructions (Signed)
Braxton Hicks Contractions °Contractions of the uterus can occur throughout pregnancy. Contractions are not always a sign that you are in labor.  °WHAT ARE BRAXTON HICKS CONTRACTIONS?  °Contractions that occur before labor are called Braxton Hicks contractions, or false labor. Toward the end of pregnancy (32-34 weeks), these contractions can develop more often and may become more forceful. This is not true labor because these contractions do not result in opening (dilatation) and thinning of the cervix. They are sometimes difficult to tell apart from true labor because these contractions can be forceful and people have different pain tolerances. You should not feel embarrassed if you go to the hospital with false labor. Sometimes, the only way to tell if you are in true labor is for your health care provider to look for changes in the cervix. °If there are no prenatal problems or other health problems associated with the pregnancy, it is completely safe to be sent home with false labor and await the onset of true labor. °HOW CAN YOU TELL THE DIFFERENCE BETWEEN TRUE AND FALSE LABOR? °False Labor °· The contractions of false labor are usually shorter and not as hard as those of true labor.   °· The contractions are usually irregular.   °· The contractions are often felt in the front of the lower abdomen and in the groin.   °· The contractions may go away when you walk around or change positions while lying down.   °· The contractions get weaker and are shorter lasting as time goes on.   °· The contractions do not usually become progressively stronger, regular, and closer together as with true labor.   °True Labor °· Contractions in true labor last 30-70 seconds, become very regular, usually become more intense, and increase in frequency.   °· The contractions do not go away with walking.   °· The discomfort is usually felt in the top of the uterus and spreads to the lower abdomen and low back.   °· True labor can be  determined by your health care provider with an exam. This will show that the cervix is dilating and getting thinner.   °WHAT TO REMEMBER °· Keep up with your usual exercises and follow other instructions given by your health care provider.   °· Take medicines as directed by your health care provider.   °· Keep your regular prenatal appointments.   °· Eat and drink lightly if you think you are going into labor.   °· If Braxton Hicks contractions are making you uncomfortable:   °· Change your position from lying down or resting to walking, or from walking to resting.   °· Sit and rest in a tub of warm water.   °· Drink 2-3 glasses of water. Dehydration may cause these contractions.   °· Do slow and deep breathing several times an hour.   °WHEN SHOULD I SEEK IMMEDIATE MEDICAL CARE? °Seek immediate medical care if: °· Your contractions become stronger, more regular, and closer together.   °· You have fluid leaking or gushing from your vagina.   °· You have a fever.   °· You pass blood-tinged mucus.   °· You have vaginal bleeding.   °· You have continuous abdominal pain.   °· You have low back pain that you never had before.   °· You feel your baby's head pushing down and causing pelvic pressure.   °· Your baby is not moving as much as it used to.   °Document Released: 04/10/2005 Document Revised: 04/15/2013 Document Reviewed: 01/20/2013 °ExitCare® Patient Information ©2015 [employer]. This information is not intended to replace advice given to you by your health care   provider. Make sure you discuss any questions you have with your health care provider. °Preterm Labor Information °Preterm labor is when labor starts at less than 37 weeks of pregnancy. The normal length of a pregnancy is 39 to 41 weeks. °CAUSES °Often, there is no identifiable underlying cause as to why a woman goes into preterm labor. One of the most common known causes of preterm labor is infection. Infections of the uterus, cervix, vagina, amniotic  sac, bladder, kidney, or even the lungs (pneumonia) can cause labor to start. Other suspected causes of preterm labor include:  °· Urogenital infections, such as yeast infections and bacterial vaginosis.   °· Uterine abnormalities (uterine shape, uterine septum, fibroids, or bleeding from the placenta).   °· A cervix that has been operated on (it may fail to stay closed).   °· Malformations in the fetus.   °· Multiple gestations (twins, triplets, and so on).   °· Breakage of the amniotic sac.   °RISK FACTORS °· Having a previous history of preterm labor.   °· Having premature rupture of membranes (PROM).   °· Having a placenta that covers the opening of the cervix (placenta previa).   °· Having a placenta that separates from the uterus (placental abruption).   °· Having a cervix that is too weak to hold the fetus in the uterus (incompetent cervix).   °· Having too much fluid in the amniotic sac (polyhydramnios).   °· Taking illegal drugs or smoking while pregnant.   °· Not gaining enough weight while pregnant.   °· Being younger than 18 and older than 25 years old.   °· Having a low socioeconomic status.   °· Being African American. °SYMPTOMS °Signs and symptoms of preterm labor include:  °· Menstrual-like cramps, abdominal pain, or back pain. °· Uterine contractions that are regular, as frequent as six in an hour, regardless of their intensity (may be mild or painful). °· Contractions that start on the top of the uterus and spread down to the lower abdomen and back.   °· A sense of increased pelvic pressure.   °· A watery or bloody mucus discharge that comes from the vagina.   °TREATMENT °Depending on the length of the pregnancy and other circumstances, your health care provider may suggest bed rest. If necessary, there are medicines that can be given to stop contractions and to mature the fetal lungs. If labor happens before 34 weeks of pregnancy, a prolonged hospital stay may be recommended. Treatment depends on  the condition of both you and the fetus.  °WHAT SHOULD YOU DO IF YOU THINK YOU ARE IN PRETERM LABOR? °Call your health care provider right away. You will need to go to the hospital to get checked immediately. °HOW CAN YOU PREVENT PRETERM LABOR IN FUTURE PREGNANCIES? °You should:  °· Stop smoking if you smoke.  °· Maintain healthy weight gain and avoid chemicals and drugs that are not necessary. °· Be watchful for any type of infection. °· Inform your health care provider if you have a known history of preterm labor. °Document Released: 07/01/2003 Document Revised: 12/11/2012 Document Reviewed: 05/13/2012 °ExitCare® Patient Information ©2015 [employer]. This information is not intended to replace advice given to you by your health care provider. Make sure you discuss any questions you have with your health care provider. ° °

## 2014-08-14 LAB — US OB FOLLOW UP

## 2014-08-18 ENCOUNTER — Ambulatory Visit (HOSPITAL_COMMUNITY)
Admission: RE | Admit: 2014-08-18 | Discharge: 2014-08-18 | Disposition: A | Discharge status: Home / Self Care | Payer: Medicaid Other | Source: Ambulatory Visit | Attending: Obstetrics and Gynecology | Admitting: Obstetrics and Gynecology

## 2014-08-18 ENCOUNTER — Other Ambulatory Visit (HOSPITAL_COMMUNITY): Payer: Self-pay | Admitting: Maternal and Fetal Medicine

## 2014-08-18 ENCOUNTER — Ambulatory Visit (HOSPITAL_COMMUNITY): Admission: RE | Admit: 2014-08-18 | Payer: Medicaid Other | Source: Ambulatory Visit

## 2014-08-18 DIAGNOSIS — O402XX1 Polyhydramnios, second trimester, fetus 1: Secondary | ICD-10-CM

## 2014-08-18 DIAGNOSIS — O09292 Supervision of pregnancy with other poor reproductive or obstetric history, second trimester: Secondary | ICD-10-CM | POA: Diagnosis not present

## 2014-08-18 DIAGNOSIS — O402XX0 Polyhydramnios, second trimester, not applicable or unspecified: Secondary | ICD-10-CM | POA: Insufficient documentation

## 2014-08-19 ENCOUNTER — Encounter (HOSPITAL_COMMUNITY): Payer: Self-pay | Admitting: Obstetrics and Gynecology

## 2014-08-21 ENCOUNTER — Encounter (HOSPITAL_COMMUNITY): Payer: Self-pay | Admitting: *Deleted

## 2014-08-21 ENCOUNTER — Telehealth (HOSPITAL_COMMUNITY): Payer: Self-pay | Admitting: *Deleted

## 2014-08-21 NOTE — Telephone Encounter (Signed)
Preadmission screen  

## 2014-08-24 DIAGNOSIS — IMO0002 Reserved for concepts with insufficient information to code with codable children: Secondary | ICD-10-CM | POA: Insufficient documentation

## 2014-08-24 NOTE — H&P (Signed)
Morgan Galloway is a 24 y.o. female, G4P0120 at 37 weeks, presenting for induction per recommendation of MFM due to IUGR and abnormal dopplers.  GBS done 08/24/14, results still pending.  Patient Active Problem List   Diagnosis Date Noted  . IUGR (intrauterine growth restriction) 08/24/2014  . Hx of intrauterine fetal death, currently pregnant     History of present pregnancy: Patient entered care at 2013 1/7 weeks. EDC of 09/15/14 was established by LMP and congruent with US.   Anatomy scan:  20 weeks, with EFW 81%ile, AFV upper limits of normal, and an anterior placenta.   Additional US evaluations:   21 weeks--EFW 411 gm, 50%ile, polyhydramnios, breech, anterior placenta, marginal insertion (fundal), normal stomach, no evidence of GI obstruction.   25 weeks:  EFW 775 gm, 46.2%ile, normal fluid, placental insertion appears normal. 29 6/7 weeks:  EFW 3+2, 19%ile, AFI 11.1, 25%ile, vtx, anterior placenta 32 weeks:  AFI 12.49, 25%ile, vtx, BPP 8/8 33 2/7 weeks:  AFI 12.1, 35%ile, BPP 8/8 34 2/7 weeks:  AFI 10.57, 25%ile, BPP 8/8 35 weeks:  EFFW 4+9, 6%ile, AFI 8.13, 5%ile, dopplers slightly elevated, no loss of flow, no reversal of flow 35 3/7 weeks:  BPP 8/8, UA dopplers elevated, no reversal or absent flow, AFI 30%ile, 11.42 08/18/14--36 weeks:  EFW 1954 gm, 5+5, < 10%ile, AFI 6.62, < 3%ile, marginal insertion (fundal), BPP 8/8, vtx, elevated dopplers, no absent/reverse flow.  MFM recommended induction at 37 weeks due to IUGR and elevated UA dopplers. 36 6/7 weeks, 08/24/14:  AFI 9.05, 10%ile, BPP 8/8, UA dopplers elevated, no reverse/absent flow. Significant prenatal events:  Followed during pregnancy with weekly BPPs since 32 weeks, with variation in AFI as listed above.  Growth issues began at 35 weeks, with BPPs remaining WNL throughout. MFM consult at 36 weeks due to abnormal dopplers, with recommendation for delivery at 37 weeks.  Marginal insertion of cord seen by MFM at 36 weeks. Last  evaluation:  08/24/14--cervix 2 cm, 40%, vtx, -3, BP 120/80--US as above  OB History    Gravida Para Term Preterm AB TAB SAB Ectopic Multiple Living   4 1  1 2  2    0    2012--IUFD at 24 weeks, cytotech induction, [facility]--? Cord issue, oligo, Dr. Gaynell FaceMarshall 2013--SAB, 5 weeks 2014--SAB, 5 weeks  Past Medical History  Diagnosis Date  . History of stillbirth     June 2012  . Gonorrhea   . Headache   . Infection     UTI  . Vaginal Pap smear, abnormal     f/u ok  . Depression     Zoloft in past; stopped due to potential of pregnancy.   Past Surgical History  Procedure Laterality Date  . No past surgeries     Family History: family history includes Bipolar disorder in her mother and sister; Cancer in her maternal grandmother; Diabetes in her maternal grandmother; Hypertension in her father; Stroke (age of onset: 8065) in her father. There is no history of Other. Mother and sister with bipolar dx; MA and MU mental disorder.  Social History:  reports that she quit smoking about 8 months ago. Her smoking use included Cigarettes. She smoked 0.50 packs per day. She has never used smokeless tobacco. She reports that she does not drink alcohol or use illicit drugs. 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     Prenatal Transfer Tool  Maternal Diabetes: No Genetic  Screening: Normal 1st trimester screen and AFP Maternal Ultrasounds/Referrals: Abnormal:  Findings:   IUGR Fetal Ultrasounds or other Referrals:  None Maternal Substance Abuse:  No Significant Maternal Medications:  None Significant Maternal Lab Results: Lab values include: Other: GBS pending from 08/24/14.  TDAP NA Flu 12/2013  ROS:  Occasional UCs, +FM  No Known Allergies     Last menstrual period 12/09/2013.  Chest clear Heart RRR without murmur Abd gravid, NT, FH 34 weeks Pelvic: Cervix 2 cm, 60%, vtx, -2 Ext: WNL  FHR: Category 1 UCs:   Occasional, mild irritability  Prenatal labs: ABO, Rh: O/Positive/-- (10/29 0000) Antibody: Negative (10/29 0000) Rubella:   Immune RPR: Nonreactive (10/29 0000)  HBsAg: Negative (10/29 0000)  HIV: Non-reactive (10/29 0000)  GBS:  Pending 08/24/14 Sickle cell/Hgb electrophoresis:  AA Pap:  Up to date noted at NOB GC:  Negative 02/19/14 Chlamydia:  Negative 02/19/14 Genetic screenings:  Normal 1st trimester screen and AFP Glucola:  WNL Other:   Hgb 12.9 at NOB, 11.6 at 28 weeks Positive enterococcus and E coli on NOB urine. F/u culture negative WNL 03/05/14    Assessment/Plan: IUP at 37 weeks Hx IUFD at 24 weeks IUGR Hx polyhydramnios, then oligohydramnios, now normal fluid on [place] 08/24/14 GBS pending from 08/24/14   Plan: Admit to Birthing Suite per consult with Dr. Normand Sloop Routine [facility] orders Pain med/epidural prn PCN G for GBS prophylaxis until GBS resulted--patient understands results may not be back until after delivery Pitocin induction--R&B of induction reviewed with patient, including failure of method, need for serial induction, possible fetal intolerance to labor, increased risk of C/S. Patient seems to understand these risks and wishes to proceed. PIH lab and PCR due to IUGR Per Dr. Normand Sloop, plan ANA and LAC on next lab draw.  Nyra Capes, [place] 08/24/2014, 9:36 PM

## 2014-08-25 ENCOUNTER — Encounter (HOSPITAL_COMMUNITY): Payer: Self-pay

## 2014-08-25 ENCOUNTER — Inpatient Hospital Stay (HOSPITAL_COMMUNITY): Payer: Medicaid Other | Admitting: Anesthesiology

## 2014-08-25 ENCOUNTER — Inpatient Hospital Stay (HOSPITAL_COMMUNITY)
Admission: RE | Admit: 2014-08-25 | Discharge: 2014-08-27 | DRG: 775 | Disposition: A | Discharge status: Home / Self Care | Payer: Medicaid Other | Source: Ambulatory Visit | Attending: Obstetrics and Gynecology | Admitting: Obstetrics and Gynecology

## 2014-08-25 VITALS — BP 102/45 | HR 60 | Temp 98.2°F | Resp 16 | Ht 64.0 in | Wt 152.0 lb

## 2014-08-25 DIAGNOSIS — O2620 Pregnancy care for patient with recurrent pregnancy loss, unspecified trimester: Secondary | ICD-10-CM | POA: Diagnosis present

## 2014-08-25 DIAGNOSIS — Z3483 Encounter for supervision of other normal pregnancy, third trimester: Secondary | ICD-10-CM | POA: Diagnosis present

## 2014-08-25 DIAGNOSIS — O9902 Anemia complicating childbirth: Secondary | ICD-10-CM | POA: Diagnosis present

## 2014-08-25 DIAGNOSIS — O365930 Maternal care for other known or suspected poor fetal growth, third trimester, not applicable or unspecified: Secondary | ICD-10-CM | POA: Diagnosis present

## 2014-08-25 DIAGNOSIS — Z3A37 37 weeks gestation of pregnancy: Secondary | ICD-10-CM | POA: Diagnosis present

## 2014-08-25 DIAGNOSIS — IMO0002 Reserved for concepts with insufficient information to code with codable children: Secondary | ICD-10-CM

## 2014-08-25 LAB — COMPREHENSIVE METABOLIC PANEL
ALT: 13 U/L — AB (ref 14–54)
AST: 24 U/L (ref 15–41)
Albumin: 3.3 g/dL — ABNORMAL LOW (ref 3.5–5.0)
Alkaline Phosphatase: 104 U/L (ref 38–126)
Anion gap: 7 (ref 5–15)
BUN: 10 mg/dL (ref 6–20)
CALCIUM: 8.7 mg/dL — AB (ref 8.9–10.3)
CO2: 23 mmol/L (ref 22–32)
CREATININE: 0.73 mg/dL (ref 0.44–1.00)
Chloride: 105 mmol/L (ref 101–111)
GFR calc Af Amer: >60 mL/min (ref >60)
Glucose, Bld: 72 mg/dL (ref 70–99)
Potassium: 4.2 mmol/L (ref 3.5–5.1)
Sodium: 135 mmol/L (ref 135–145)
TOTAL PROTEIN: 6.6 g/dL (ref 6.5–8.1)
Total Bilirubin: 0.2 mg/dL — ABNORMAL LOW (ref 0.3–1.2)

## 2014-08-25 LAB — CBC
HCT: 35 % — ABNORMAL LOW (ref 36.0–46.0)
Hemoglobin: 12.2 g/dL (ref 12.0–15.0)
MCH: 31.7 pg (ref 26.0–34.0)
MCHC: 34.9 g/dL (ref 30.0–36.0)
MCV: 90.9 fL (ref 78.0–100.0)
PLATELETS: 109 10*3/uL — AB (ref 150–400)
RBC: 3.85 MIL/uL — AB (ref 3.87–5.11)
RDW: 13.2 % (ref 11.5–15.5)
WBC: 10.5 10*3/uL (ref 4.0–10.5)

## 2014-08-25 LAB — LACTATE DEHYDROGENASE: LDH: 168 U/L (ref 98–192)

## 2014-08-25 LAB — TYPE AND SCREEN
ABO/RH(D): O POS
Antibody Screen: NEGATIVE

## 2014-08-25 LAB — RPR: RPR Ser Ql: NONREACTIVE

## 2014-08-25 LAB — PROTEIN / CREATININE RATIO, URINE
CREATININE, URINE: 74 mg/dL
Protein Creatinine Ratio: 0.12 mg/mg{Cre} (ref 0.00–0.15)
TOTAL PROTEIN, URINE: 9 mg/dL

## 2014-08-25 LAB — URIC ACID: Uric Acid, Serum: 5.3 mg/dL (ref 2.3–6.6)

## 2014-08-25 LAB — HIV ANTIBODY (ROUTINE TESTING W REFLEX): HIV Screen 4th Generation wRfx: NONREACTIVE

## 2014-08-25 MED ORDER — CITRIC ACID-SODIUM CITRATE 334-500 MG/5ML PO SOLN: 30.0000 mL | ORAL | Status: DC | PRN

## 2014-08-25 MED ORDER — OXYCODONE-ACETAMINOPHEN 5-325 MG PO TABS: 2.0000 | ORAL_TABLET | ORAL | Status: DC | PRN

## 2014-08-25 MED ORDER — BENZOCAINE-MENTHOL 20-0.5 % EX AERO
1.0000 "application " | INHALATION_SPRAY | CUTANEOUS | Status: DC | PRN
  Administered 2014-08-25: 1 via TOPICAL
  Filled 2014-08-25: qty 56

## 2014-08-25 MED ORDER — ONDANSETRON HCL 4 MG/2ML IJ SOLN: 4.0000 mg | Freq: Four times a day (QID) | INTRAMUSCULAR | Status: DC | PRN

## 2014-08-25 MED ORDER — FENTANYL 2.5 MCG/ML BUPIVACAINE 1/10 % EPIDURAL INFUSION (WH - ANES): 14.0000 mL/h | INTRAMUSCULAR | Status: DC | PRN

## 2014-08-25 MED ORDER — PHENYLEPHRINE 40 MCG/ML (10ML) SYRINGE FOR IV PUSH (FOR BLOOD PRESSURE SUPPORT)
80.0000 ug | PREFILLED_SYRINGE | INTRAVENOUS | Status: DC | PRN
  Filled 2014-08-25: qty 20

## 2014-08-25 MED ORDER — PENICILLIN G POTASSIUM 5000000 UNITS IJ SOLR
5.0000 10*6.[IU] | Freq: Once | INTRAVENOUS | Status: AC
  Administered 2014-08-25: 5 10*6.[IU] via INTRAVENOUS
  Filled 2014-08-25: qty 5

## 2014-08-25 MED ORDER — OXYCODONE-ACETAMINOPHEN 5-325 MG PO TABS
1.0000 | ORAL_TABLET | ORAL | Status: DC | PRN
  Administered 2014-08-26 – 2014-08-27 (×5): 1 via ORAL
  Filled 2014-08-25 (×5): qty 1

## 2014-08-25 MED ORDER — IBUPROFEN 600 MG PO TABS
600.0000 mg | ORAL_TABLET | Freq: Four times a day (QID) | ORAL | Status: DC
  Administered 2014-08-25 – 2014-08-27 (×6): 600 mg via ORAL
  Filled 2014-08-25 (×7): qty 1

## 2014-08-25 MED ORDER — PRENATAL MULTIVITAMIN CH
1.0000 | ORAL_TABLET | Freq: Every day | ORAL | Status: DC
  Administered 2014-08-26 – 2014-08-27 (×2): 1 via ORAL
  Filled 2014-08-25 (×2): qty 1

## 2014-08-25 MED ORDER — DIPHENHYDRAMINE HCL 50 MG/ML IJ SOLN: 12.5000 mg | INTRAMUSCULAR | Status: DC | PRN

## 2014-08-25 MED ORDER — ZOLPIDEM TARTRATE 5 MG PO TABS: 5.0000 mg | ORAL_TABLET | Freq: Every evening | ORAL | Status: DC | PRN

## 2014-08-25 MED ORDER — DIBUCAINE 1 % RE OINT
1.0000 "application " | TOPICAL_OINTMENT | RECTAL | Status: DC | PRN
  Filled 2014-08-25: qty 28

## 2014-08-25 MED ORDER — PENICILLIN G POTASSIUM 5000000 UNITS IJ SOLR
2.5000 10*6.[IU] | INTRAVENOUS | Status: DC
  Administered 2014-08-25 (×2): 2.5 10*6.[IU] via INTRAVENOUS
  Filled 2014-08-25 (×5): qty 2.5

## 2014-08-25 MED ORDER — OXYTOCIN BOLUS FROM INFUSION
500.0000 mL | INTRAVENOUS | Status: DC
  Administered 2014-08-25: 500 mL via INTRAVENOUS

## 2014-08-25 MED ORDER — EPHEDRINE 5 MG/ML INJ: 10.0000 mg | INTRAVENOUS | Status: DC | PRN

## 2014-08-25 MED ORDER — OXYTOCIN 40 UNITS IN LACTATED RINGERS INFUSION - SIMPLE MED
1.0000 m[IU]/min | INTRAVENOUS | Status: DC
  Administered 2014-08-25: 2 m[IU]/min via INTRAVENOUS
  Filled 2014-08-25: qty 1000

## 2014-08-25 MED ORDER — ACETAMINOPHEN 325 MG PO TABS: 650.0000 mg | ORAL_TABLET | ORAL | Status: DC | PRN

## 2014-08-25 MED ORDER — ONDANSETRON HCL 4 MG PO TABS: 4.0000 mg | ORAL_TABLET | ORAL | Status: DC | PRN

## 2014-08-25 MED ORDER — FLEET ENEMA 7-19 GM/118ML RE ENEM: 1.0000 | ENEMA | RECTAL | Status: DC | PRN

## 2014-08-25 MED ORDER — LACTATED RINGERS IV SOLN
500.0000 mL | INTRAVENOUS | Status: DC | PRN
Start: 2014-08-25 — End: 2014-08-25
  Administered 2014-08-25 (×2): 300 mL via INTRAVENOUS

## 2014-08-25 MED ORDER — LIDOCAINE HCL (PF) 1 % IJ SOLN
INTRAMUSCULAR | Status: DC | PRN
  Administered 2014-08-25: 10 mL

## 2014-08-25 MED ORDER — TERBUTALINE SULFATE 1 MG/ML IJ SOLN: 0.2500 mg | Freq: Once | INTRAMUSCULAR | Status: DC | PRN

## 2014-08-25 MED ORDER — BUTORPHANOL TARTRATE 1 MG/ML IJ SOLN: 1.0000 mg | INTRAMUSCULAR | Status: DC | PRN

## 2014-08-25 MED ORDER — MISOPROSTOL 25 MCG QUARTER TABLET: 25.0000 ug | ORAL_TABLET | ORAL | Status: DC | PRN

## 2014-08-25 MED ORDER — SENNOSIDES-DOCUSATE SODIUM 8.6-50 MG PO TABS
2.0000 | ORAL_TABLET | ORAL | Status: DC
  Administered 2014-08-25 – 2014-08-27 (×2): 2 via ORAL
  Filled 2014-08-25 (×2): qty 2

## 2014-08-25 MED ORDER — LACTATED RINGERS IV SOLN
INTRAVENOUS | Status: DC
  Administered 2014-08-25 (×3): via INTRAVENOUS

## 2014-08-25 MED ORDER — FENTANYL 2.5 MCG/ML BUPIVACAINE 1/10 % EPIDURAL INFUSION (WH - ANES)
14.0000 mL/h | INTRAMUSCULAR | Status: DC | PRN
  Administered 2014-08-25 (×2): 14 mL/h via EPIDURAL
  Filled 2014-08-25: qty 125

## 2014-08-25 MED ORDER — WITCH HAZEL-GLYCERIN EX PADS: 1.0000 "application " | MEDICATED_PAD | CUTANEOUS | Status: DC | PRN

## 2014-08-25 MED ORDER — LIDOCAINE HCL (PF) 1 % IJ SOLN
30.0000 mL | INTRAMUSCULAR | Status: DC | PRN
  Filled 2014-08-25: qty 30

## 2014-08-25 MED ORDER — OXYCODONE-ACETAMINOPHEN 5-325 MG PO TABS: 1.0000 | ORAL_TABLET | ORAL | Status: DC | PRN

## 2014-08-25 MED ORDER — ONDANSETRON HCL 4 MG/2ML IJ SOLN: 4.0000 mg | INTRAMUSCULAR | Status: DC | PRN

## 2014-08-25 MED ORDER — OXYTOCIN 40 UNITS IN LACTATED RINGERS INFUSION - SIMPLE MED: 62.5000 mL/h | INTRAVENOUS | Status: DC

## 2014-08-25 MED ORDER — TETANUS-DIPHTH-ACELL PERTUSSIS 5-2.5-18.5 LF-MCG/0.5 IM SUSP
0.5000 mL | Freq: Once | INTRAMUSCULAR | Status: AC
  Administered 2014-08-27: 0.5 mL via INTRAMUSCULAR
  Filled 2014-08-25: qty 0.5

## 2014-08-25 MED ORDER — DIPHENHYDRAMINE HCL 25 MG PO CAPS: 25.0000 mg | ORAL_CAPSULE | Freq: Four times a day (QID) | ORAL | Status: DC | PRN

## 2014-08-25 MED ORDER — SIMETHICONE 80 MG PO CHEW: 80.0000 mg | CHEWABLE_TABLET | ORAL | Status: DC | PRN

## 2014-08-25 MED ORDER — LANOLIN HYDROUS EX OINT: TOPICAL_OINTMENT | CUTANEOUS | Status: DC | PRN

## 2014-08-25 NOTE — Progress Notes (Signed)
  Subjective: Comfortable with epidural.  More numb on left than right.  Objective: BP 128/79 mmHg  Pulse 58  Temp(Src) 98.3 F (36.8 C) (Oral)  Resp 20  Ht 5\' 4"  (1.626 m)  Wt 68.947 kg (152 lb)  BMI 26.08 kg/m2  SpO2 100%  LMP 12/09/2013   Total I/O In: -  Out: 450 [Urine:450]  FHT: Category 1--had episode of variables when patient on right side, resolved with position change UC:   irregular, every 2-4 minutes SVE:   Dilation: 6 Effacement (%): 80 Station: 0 Exam by:: Emilee HeroLatham, CNM Pitocin at 18 mu/min MVUs 120-210 over last hour, 210 in last 10 min segment.  Assessment:  Induction for IUGR, abnormal dopplers GBS pending from 08/24/14  Plan: Continue pitocin to maintain adequate labor.  Nigel BridgemanLATHAM, Maika Mcelveen CNM 08/25/2014, 5:29 PM

## 2014-08-25 NOTE — Anesthesia Preprocedure Evaluation (Addendum)
Anesthesia Evaluation  Patient identified by MRN, date of birth, ID band Patient awake    Reviewed: Allergy & Precautions, H&P , NPO status , Patient's Chart, lab work & pertinent test results  Airway Mallampati: II  TM Distance: >3 FB Neck ROM: full    Dental no notable dental hx. (+) Teeth Intact   Pulmonary neg pulmonary ROS,  breath sounds clear to auscultation  Pulmonary exam normal       Cardiovascular Exercise Tolerance: Good negative cardio ROS  Rhythm:regular Rate:Normal     Neuro/Psych negative neurological ROS  negative psych ROS   GI/Hepatic negative GI ROS, Neg liver ROS,   Endo/Other  negative endocrine ROS  Renal/GU negative Renal ROS  negative genitourinary   Musculoskeletal   Abdominal   Peds  Hematology negative hematology ROS (+) plts 109   Anesthesia Other Findings   Reproductive/Obstetrics negative OB ROS (+) Pregnancy                            Anesthesia Physical Anesthesia Plan  ASA: II  Anesthesia Plan: Epidural   Post-op Pain Management:    Induction:   Airway Management Planned:   Additional Equipment:   Intra-op Plan:   Post-operative Plan:   Informed Consent: I have reviewed the patients History and Physical, chart, labs and discussed the procedure including the risks, benefits and alternatives for the proposed anesthesia with the patient or authorized representative who has indicated his/her understanding and acceptance.   Dental Advisory Given  Plan Discussed with: CRNA  Anesthesia Plan Comments:         Anesthesia Quick Evaluation

## 2014-08-25 NOTE — Progress Notes (Signed)
  Subjective: Comfortable with epidural, but still aware of some back pain.  Remains mildly anxious. Wants to use bedpan in lieu of catheter.  Objective: BP 138/62 mmHg  Pulse 59  Temp(Src) 98.2 F (36.8 C) (Oral)  Resp 18  Ht 5\' 4"  (1.626 m)  Wt 68.947 kg (152 lb)  BMI 26.08 kg/m2  SpO2 100%  LMP 12/09/2013   Total I/O In: -  Out: 450 [Urine:450]   Has voided twice on bedpan without difficulty.  FHT: Category 1 UC:   q 2-4 minutes SVE:   Dilation: 3 Effacement (%): 80 Station: 0 Exam by:: Morgan Galloway, cnm BBOW--AROM, clear fluid IUPC and FSE applied without difficulty, although patient very anxious during exam. Pitocin on 10 mu/min  Assessment:  Induction for IUGR, hx IUFD GBS pending from 5/2--not resulted yet, on prophylaxis  Plan: Continue current care.   Nigel BridgemanLATHAM, Morgan Galloway CNM 08/25/2014, 3:04 PM

## 2014-08-25 NOTE — Anesthesia Procedure Notes (Signed)
Epidural Patient location during procedure: OB Start time: 08/25/2014 1:30 PM End time: 08/25/2014 1:48 PM  Staffing Anesthesiologist: Sebastian AcheMANNY, Kendalyn Cranfield  Preanesthetic Checklist Completed: patient identified, site marked, surgical consent, pre-op evaluation, timeout performed, IV checked, risks and benefits discussed and monitors and equipment checked  Epidural Patient position: sitting Prep: site prepped and draped and DuraPrep Patient monitoring: heart rate, continuous pulse ox and blood pressure Approach: midline Location: L2-L3 Injection technique: LOR air  Needle:  Needle type: Tuohy  Needle gauge: 17 G Needle length: 9 cm and 9 Needle insertion depth: 6 cm Catheter type: closed end flexible Catheter size: 20 Guage Catheter at skin depth: 14 cm Test dose: negative  Assessment Events: blood not aspirated, injection not painful, no injection resistance, negative IV test and no paresthesia  Additional Notes   Patient tolerated the insertion well without complications.Reason for block:procedure for pain

## 2014-08-25 NOTE — Progress Notes (Signed)
Subjective: More aware of contractions--wants epidural, but very anxious about the process.    Objective: BP 140/75 mmHg  Pulse 82  Temp(Src) 98.9 F (37.2 C) (Oral)  Resp 18  Ht 5\' 4"  (1.626 m)  Wt 68.947 kg (152 lb)  BMI 26.08 kg/m2  LMP 12/09/2013   Filed Vitals:   08/25/14 1131 08/25/14 1201 08/25/14 1231 08/25/14 1301  BP: 126/73 119/57 126/66 140/75  Pulse: 58 59 62 82  Temp:      TempSrc:      Resp:  18  18  Height:      Weight:            FHT: Category 1 at present--has had sporadic mild variables and lates, resolved with position changes and fluid boluses.  Moderate variability continues. UC:   regular, every 3 minutes SVE:  2-3 cm, 80%, vtx, -2, slight BBOW   Pitocin at 10 mu/min  Results for orders placed or performed during the hospital encounter of 08/25/14 (from the past 24 hour(s))  CBC     Status: Abnormal   Collection Time: 08/25/14  8:05 AM  Result Value Ref Range   WBC 10.5 4.0 - 10.5 K/uL   RBC 3.85 (L) 3.87 - 5.11 MIL/uL   Hemoglobin 12.2 12.0 - 15.0 g/dL   HCT 16.135.0 (L) 09.636.0 - 04.546.0 %   MCV 90.9 78.0 - 100.0 fL   MCH 31.7 26.0 - 34.0 pg   MCHC 34.9 30.0 - 36.0 g/dL   RDW 40.913.2 81.111.5 - 91.415.5 %   Platelets 109 (L) 150 - 400 K/uL  Comprehensive metabolic panel     Status: Abnormal   Collection Time: 08/25/14  8:05 AM  Result Value Ref Range   Sodium 135 135 - 145 mmol/L   Potassium 4.2 3.5 - 5.1 mmol/L   Chloride 105 101 - 111 mmol/L   CO2 23 22 - 32 mmol/L   Glucose, Bld 72 70 - 99 mg/dL   BUN 10 6 - 20 mg/dL   Creatinine, Ser 7.820.73 0.44 - 1.00 mg/dL   Calcium 8.7 (L) 8.9 - 10.3 mg/dL   Total Protein 6.6 6.5 - 8.1 g/dL   Albumin 3.3 (L) 3.5 - 5.0 g/dL   AST 24 15 - 41 U/L   ALT 13 (L) 14 - 54 U/L   Alkaline Phosphatase 104 38 - 126 U/L   Total Bilirubin 0.2 (L) 0.3 - 1.2 mg/dL   GFR calc non Af Amer >60 >60 mL/min   GFR calc Af Amer >60 >60 mL/min   Anion gap 7 5 - 15  Lactate dehydrogenase     Status: None   Collection Time: 08/25/14   8:05 AM  Result Value Ref Range   LDH 168 98 - 192 U/L  Uric acid     Status: None   Collection Time: 08/25/14  8:05 AM  Result Value Ref Range   Uric Acid, Serum 5.3 2.3 - 6.6 mg/dL  Protein / creatinine ratio, urine     Status: None   Collection Time: 08/25/14  9:42 AM  Result Value Ref Range   Creatinine, Urine 74.00 mg/dL   Total Protein, Urine 9 mg/dL   Protein Creatinine Ratio 0.12 0.00 - 0.15 mg/mg[Cre]  Type and screen     Status: None   Collection Time: 08/25/14  9:49 AM  Result Value Ref Range   ABO/RH(D) O POS    Antibody Screen NEG    Sample Expiration 08/28/2014     Assessment:  Induction for IUGR, abnormal dopplers GBS pending from 08/24/14 Throbocytopenia Hx IUFD  Plan: Recommended AROM, with epidural placement prior to that in light of platelet count. Patient agreeable with plan. Continue close observation of FHR status.  Nigel Bridgeman CNM 08/25/2014, 1:14 PM

## 2014-08-26 ENCOUNTER — Encounter (HOSPITAL_COMMUNITY): Payer: Self-pay

## 2014-08-26 LAB — CBC
HCT: 33.2 % — ABNORMAL LOW (ref 36.0–46.0)
Hemoglobin: 11.4 g/dL — ABNORMAL LOW (ref 12.0–15.0)
MCH: 31.5 pg (ref 26.0–34.0)
MCHC: 34.3 g/dL (ref 30.0–36.0)
MCV: 91.7 fL (ref 78.0–100.0)
PLATELETS: 101 10*3/uL — AB (ref 150–400)
RBC: 3.62 MIL/uL — AB (ref 3.87–5.11)
RDW: 13.4 % (ref 11.5–15.5)
WBC: 14.2 10*3/uL — AB (ref 4.0–10.5)

## 2014-08-26 NOTE — Lactation Note (Signed)
This note was copied from the chart of Morgan Galloway. Lactation Consultation Note Assisted with latching baby to breast in NICU.  Baby is very sleepy.  Positioned baby in football hold.  Mom recently pumped 1 ml of colostrum which was given to baby by syringe at breast.  Baby intermittently sucked off and on for 10 minutes with good breast massage and stimulation.  Mom worried because baby was nursing well last PM.  Reassured mom that this is normal newborn behavior especially the first 24 hours.  Mom's breasts are beginning to fill.  Instructed to pump every 2-3 hours followed by hand expression.  Patient Name: Morgan Jill PolingJoshea Galloway [id number]'MToday's Date: 08/26/2014 Reason for consult: Follow-up assessment   Maternal Data    Feeding Feeding Type: Breast Milk Length of feed: 10 min  LATCH Score/Interventions Latch: Repeated attempts needed to sustain latch, nipple held in mouth throughout feeding, stimulation needed to elicit sucking reflex. Intervention(s): Adjust position;Assist with latch;Breast massage;Breast compression  Audible Swallowing: A few with stimulation Intervention(s): Skin to skin;Hand expression;Alternate breast massage  Type of Nipple: Everted at rest and after stimulation  Comfort (Breast/Nipple): Soft / non-tender     Hold (Positioning): Assistance needed to correctly position infant at breast and maintain latch. Intervention(s): Breastfeeding basics reviewed;Support Pillows;Position options;Skin to skin  LATCH Score: 7  Lactation Tools Discussed/Used Tools: Medicine Dropper   Consult Status Consult Status: Follow-up Date: 08/27/14 Follow-up type: In-patient    Huston FoleyMOULDEN, Ludean Duhart S 08/26/2014, 3:31 PM

## 2014-08-26 NOTE — Anesthesia Postprocedure Evaluation (Signed)
  Anesthesia Post-op Note  Patient: Morgan Galloway  Procedure(s) Performed: * No procedures listed *  Patient Location: PACU and Mother/Baby  Anesthesia Type:Epidural  Level of Consciousness: awake, alert  and oriented  Airway and Oxygen Therapy: Patient Spontanous Breathing  Post-op Pain: none  Post-op Assessment: Post-op Vital signs reviewed and Patient's Cardiovascular Status Stable  Post-op Vital Signs: Reviewed and stable  Last Vitals:  Filed Vitals:   08/26/14 0549  BP: 110/68  Pulse: 56  Temp: 36.7 C  Resp: 18    Complications: No apparent anesthesia complications

## 2014-08-26 NOTE — Lactation Note (Signed)
This note was copied from the chart of [name]. Lactation Consultation Note  Patient Name: [name] MVHQI'OToday's Date: 08/26/2014 Reason for consult: Initial assessment NICU baby 16 hours of life. Mom has DEBP at bedside and states that she has started pumping but is not seeing anything. Discussed normal progression of milk coming in. Enc mom to keep pumping every 3 hours for 15 minutes. Discussed hand expressing after pumping, and given additional small bottles with stickers and instructions for labeling EBM and transporting to NICU. Enc mom to provide STS to baby as baby able. Mom given NICU booklet with review. Mom aware of pumping rooms in NICU. Mom active with WIC, [facility] and discussed need for DEBP with mom. [facility]. Discussed importance of EBM for baby. Enc mom to call for assistance as needed. Mom aware of OP/BFSG, community resources, and [facility] after [facility].  Maternal Data Has patient been taught Hand Expression?: Yes Does the patient have breastfeeding experience prior to this delivery?: No  Feeding    LATCH Score/Interventions                      Lactation Tools Discussed/Used WIC Program: Yes Pump Review: Setup, frequency, and cleaning;Milk Storage Initiated by:: Bedside RN. Date initiated:: 08/26/14   Consult Status      Geralynn OchsWILLIARD, Morgan Fronczak 08/26/2014, 11:18 AM

## 2014-08-26 NOTE — Progress Notes (Signed)
Tiziana A Edmendson   Subjective: Post Partum Day 1 Vaginal delivery, Periurethral Patient up ad lib, denies syncope or dizziness. Reports consuming regular diet without issues and denies N/V No issues with urination and reports bleeding is appropriate  Feeding:  breast Contraceptive plan:   Unsure Baby in NICU for low blood sugars  Objective: Temp:  [98.1 F (36.7 C)-98.9 F (37.2 C)] 98.1 F (36.7 C) (05/04 0549) Pulse Rate:  [54-107] 56 (05/04 0549) Resp:  [18-24] 18 (05/04 0549) BP: (106-164)/(40-102) 110/68 mmHg (05/04 0549) SpO2:  [90 %-100 %] 100 % (05/03 1420)  Physical Exam:  General: alert and cooperative Ext: WNL, no edema. No evidence of DVT seen on physical exam. Breast: Soft filling Lungs: CTAB Heart RRR without murmur  Abdomen:  Soft, fundus firm, lochia scant, + bowel sounds, non distended, non tender Lochia: appropriate Uterine Fundus: firm Laceration: healing well    Recent Labs  08/25/14 0805 08/26/14 0605  HGB 12.2 11.4*  HCT 35.0* 33.2*    Assessment S/P Vaginal Delivery-Day 1 Stable  Normal Involution Breastfeeding Circumcision: out patient  Plan: Continue current care Plan for discharge tomorrow, Breastfeeding and Lactation consult Lactation support   Jannel Lynne, CNM, MSN 08/26/2014, 10:32 AM

## 2014-08-27 MED ORDER — MEDROXYPROGESTERONE ACETATE 150 MG/ML IM SUSP
150.0000 mg | Freq: Once | INTRAMUSCULAR | Status: AC
  Administered 2014-08-27: 150 mg via INTRAMUSCULAR
  Filled 2014-08-27: qty 1

## 2014-08-27 MED ORDER — IBUPROFEN 800 MG PO TABS: 800.0000 mg | ORAL_TABLET | Freq: Three times a day (TID) | ORAL | Status: DC | PRN

## 2014-08-27 MED ORDER — FERROUS SULFATE 325 (65 FE) MG PO TABS: 325.0000 mg | ORAL_TABLET | Freq: Two times a day (BID) | ORAL | Status: DC

## 2014-08-27 MED ORDER — DOCUSATE SODIUM 100 MG PO CAPS: 100.0000 mg | ORAL_CAPSULE | Freq: Two times a day (BID) | ORAL | Status: DC

## 2014-08-27 MED ORDER — OXYCODONE-ACETAMINOPHEN 5-325 MG PO TABS: 1.0000 | ORAL_TABLET | ORAL | Status: DC | PRN

## 2014-08-27 MED ORDER — MEDROXYPROGESTERONE ACETATE 150 MG/ML IM SUSP: 150.0000 mg | INTRAMUSCULAR | Status: DC

## 2014-08-27 NOTE — Discharge Instructions (Signed)
Cesarean Delivery, Care After °Refer to this sheet in the next few weeks. These instructions provide you with information on caring for yourself after your procedure. Your health care provider may also give you specific instructions. Your treatment has been planned according to current medical practices, but problems sometimes occur. Call your health care provider if you have any problems or questions after you go home. °HOME CARE INSTRUCTIONS  °· Only take over-the-counter or prescription medications as directed by your health care provider. °· Do not drink alcohol, especially if you are breastfeeding or taking medication to relieve pain. °· Do not chew or smoke tobacco. °· Continue to use good perineal care. Good perineal care includes: °¨ Wiping your perineum from front to back. °¨ Keeping your perineum clean. °· Check your surgical cut (incision) daily for increased redness, drainage, swelling, or separation of skin. °· Clean your incision gently with soap and water every day, and then pat it dry. If your health care provider says it is okay, leave the incision uncovered. Use a bandage (dressing) if the incision is draining fluid or appears irritated. If the adhesive strips across the incision do not fall off within 7 days, carefully peel them off. °· Hug a pillow when coughing or sneezing until your incision is healed. This helps to relieve pain. °· Do not use tampons or douche until your health care provider says it is okay. °· Shower, wash your hair, and take tub baths as directed by your health care provider. °· Wear a well-fitting bra that provides breast support. °· Limit wearing support panties or control-top hose. °· Drink enough fluids to keep your urine clear or pale yellow. °· Eat high-fiber foods such as whole grain cereals and breads, brown rice, beans, and fresh fruits and vegetables every day. These foods may help prevent or relieve constipation. °· Resume activities such as climbing stairs,  driving, lifting, exercising, or traveling as directed by your health care provider. °· Talk to your health care provider about resuming sexual activities. This is dependent upon your risk of infection, your rate of healing, and your comfort and desire to resume sexual activity. °· Try to have someone help you with your household activities and your newborn for at least a few days after you leave the hospital. °· Rest as much as possible. Try to rest or take a nap when your newborn is sleeping. °· Increase your activities gradually. °· Keep all of your scheduled postpartum appointments. It is very important to keep your scheduled follow-up appointments. At these appointments, your health care provider will be checking to make sure that you are healing physically and emotionally. °SEEK MEDICAL CARE IF:  °· You are passing large clots from your vagina. Save any clots to show your health care provider. °· You have a foul smelling discharge from your vagina. °· You have trouble urinating. °· You are urinating frequently. °· You have pain when you urinate. °· You have a change in your bowel movements. °· You have increasing redness, pain, or swelling near your incision. °· You have pus draining from your incision. °· Your incision is separating. °· You have painful, hard, or reddened breasts. °· You have a severe headache. °· You have blurred vision or see spots. °· You feel sad or depressed. °· You have thoughts of hurting yourself or your newborn. °· You have questions about your care, the care of your newborn, or medications. °· You are dizzy or light-headed. °· You have a rash. °· You   have pain, redness, or swelling at the site of the removed intravenous access (IV) tube. °· You have nausea or vomiting. °· You stopped breastfeeding and have not had a menstrual period within 12 weeks of stopping. °· You are not breastfeeding and have not had a menstrual period within 12 weeks of delivery. °· You have a fever. °SEEK  IMMEDIATE MEDICAL CARE IF: °· You have persistent pain. °· You have chest pain. °· You have shortness of breath. °· You faint. °· You have leg pain. °· You have stomach pain. °· Your vaginal bleeding saturates 2 or more sanitary pads in 1 hour. °MAKE SURE YOU:  °· Understand these instructions. °· Will watch your condition. °· Will get help right away if you are not doing well or get worse. °Document Released: 12/31/2001 Document Revised: 08/25/2013 Document Reviewed: 12/06/2011 °ExitCare® Patient Information ©2015 [employer]. This information is not intended to replace advice given to you by your health care provider. Make sure you discuss any questions you have with your health care provider. °Postpartum Depression and Baby Blues °The postpartum period begins right after the birth of a baby. During this time, there is often a great amount of joy and excitement. It is also a time of many changes in the life of the parents. Regardless of how many times a mother gives birth, each child brings new challenges and dynamics to the family. It is not unusual to have feelings of excitement along with confusing shifts in moods, emotions, and thoughts. All mothers are at risk of developing postpartum depression or the "baby blues." These mood changes can occur right after giving birth, or they may occur many months after giving birth. The baby blues or postpartum depression can be mild or severe. Additionally, postpartum depression can go away rather quickly, or it can be a long-term condition.  °CAUSES °Raised hormone levels and the rapid drop in those levels are thought to be a main cause of postpartum depression and the baby blues. A number of hormones change during and after pregnancy. Estrogen and progesterone usually decrease right after the delivery of your baby. The levels of thyroid hormone and various cortisol steroids also rapidly drop. Other factors that play a role in these mood changes include major life  events and genetics.  °RISK FACTORS °If you have any of the following risks for the baby blues or postpartum depression, know what symptoms to watch out for during the postpartum period. Risk factors that may increase the likelihood of getting the baby blues or postpartum depression include: °· Having a personal or family history of depression.   °· Having depression while being pregnant.   °· Having premenstrual mood issues or mood issues related to oral contraceptives. °· Having a lot of life stress.   °· Having marital conflict.   °· Lacking a social support network.   °· Having a baby with special needs.   °· Having health problems, such as diabetes.   °SIGNS AND SYMPTOMS °Symptoms of baby blues include: °· Brief changes in mood, such as going from extreme happiness to sadness. °· Decreased concentration.   °· Difficulty sleeping.   °· Crying spells, tearfulness.   °· Irritability.   °· Anxiety.   °Symptoms of postpartum depression typically begin within the first month after giving birth. These symptoms include: °· Difficulty sleeping or excessive sleepiness.   °· Marked weight loss.   °· Agitation.   °· Feelings of worthlessness.   °· Lack of interest in activity or food.   °Postpartum psychosis is a very serious condition and can be dangerous. Fortunately, it   is rare. Displaying any of the following symptoms is cause for immediate medical attention. Symptoms of postpartum psychosis include:  °· Hallucinations and delusions.   °· Bizarre or disorganized behavior.   °· Confusion or disorientation.   °DIAGNOSIS  °A diagnosis is made by an evaluation of your symptoms. There are no medical or lab tests that lead to a diagnosis, but there are various questionnaires that a health care provider may use to identify those with the baby blues, postpartum depression, or psychosis. Often, a screening tool called the Edinburgh Postnatal Depression Scale is used to diagnose depression in the postpartum period.   °TREATMENT °The baby blues usually goes away on its own in 1-2 weeks. Social support is often all that is needed. You will be encouraged to get adequate sleep and rest. Occasionally, you may be given medicines to help you sleep.  °Postpartum depression requires treatment because it can last several months or longer if it is not treated. Treatment may include individual or group therapy, medicine, or both to address any social, physiological, and psychological factors that may play a role in the depression. Regular exercise, a healthy diet, rest, and social support may also be strongly recommended.  °Postpartum psychosis is more serious and needs treatment right away. Hospitalization is often needed. °HOME CARE INSTRUCTIONS °· Get as much rest as you can. Nap when the baby sleeps.   °· Exercise regularly. Some women find yoga and walking to be beneficial.   °· Eat a balanced and nourishing diet.   °· Do little things that you enjoy. Have a cup of tea, take a bubble bath, read your favorite magazine, or listen to your favorite music. °· Avoid alcohol.   °· Ask for help with household chores, cooking, grocery shopping, or running errands as needed. Do not try to do everything.   °· Talk to people close to you about how you are feeling. Get support from your partner, family members, friends, or other new moms. °· Try to stay positive in how you think. Think about the things you are grateful for.   °· Do not spend a lot of time alone.   °· Only take over-the-counter or prescription medicine as directed by your health care provider. °· Keep all your postpartum appointments.   °· Let your health care provider know if you have any concerns.   °SEEK MEDICAL CARE IF: °You are having a reaction to or problems with your medicine. °SEEK IMMEDIATE MEDICAL CARE IF: °· You have suicidal feelings.   °· You think you may harm the baby or someone else. °MAKE SURE YOU: °· Understand these instructions. °· Will watch your  condition. °· Will get help right away if you are not doing well or get worse. °Document Released: 01/13/2004 Document Revised: 04/15/2013 Document Reviewed: 01/20/2013 °ExitCare® Patient Information ©2015 [employer]. This information is not intended to replace advice given to you by your health care provider. Make sure you discuss any questions you have with your health care provider. ° °

## 2014-08-27 NOTE — Clinical Social Work Maternal (Signed)
CLINICAL SOCIAL WORK MATERNAL/CHILD NOTE  Patient Details  Name: Morgan Galloway MRN: [id number] Date of Birth: 01/23/1991  Date:  08/27/2014  Clinical Social Worker Initiating Note:  Tavone Caesar E. Hudsyn Barich, LCSW Date/ Time Initiated:  08/27/14/1100     Child's Name:  Morgan "RJ" Galloway Jr.   Legal Guardian:   (Parents: Morgan and Morgan Galloway)   Need for Interpreter:  None   Date of Referral:  08/27/14     Reason for Referral:  Other (Comment) (History depression treated with zoloft prior to pregnancy; baby in NICU;)   Referral Source:  RN   Address:  [address] [place], [place] [address]  Phone number:  [phone]   Household Members:      Natural Supports (not living in the home):  Extended Family   Professional Supports:     Employment:     Type of Work:     Education:      Financial Resources:  Medicaid   Other Resources:      Cultural/Religious Considerations Which May Impact Care:  None stated  Strengths:  Ability to meet basic needs , Compliance with medical plan , Home prepared for child , Pediatrician chosen  (Pediatric follow up will be with [facility])   Risk Factors/Current Problems:  Adjustment to Illness , Mental Health Concerns  (Hx of depression/hx of IUFD at 24 weeks and 2 SAB)   Cognitive State:  Alert , Goal Oriented , Linear Thinking    Mood/Affect:  Calm , Interested , Relaxed    CSW Assessment: CSW met with MOB in her first floor room/129 to introduce myself, offer support and complete assessment due to baby's admission to NICU at 37 weeks for Hypoglycemia.  MOB was in her bathroom applying her makeup but welcomed CSW and stated that it was a good time to talk.  CSW feels the setting was slightly less than ideal as MOB focused on her makeup and eye contact was through the mirror, but overall, CSW feels MOB benefited from the contact and heard the information provided by CSW.   MOB was open about her emotions and  her hx of loss.  MOB experienced an IUFD at 24 weeks in 2012 and two subsequent miscarriages.  She feels like her baby is finally here, but she has to continue to wait to be able to take him home.  CSW validated her feelings and offered supportive counseling as she processed her feelings.  CSW discussed common emotions related to a NICU admission and the grief over the loss of expectations in how MOB envisioned the first few days/weeks with her baby to be.  MOB was very interested in talking about PPD signs and symptoms to watch for, with CSW provided in depth education on.  MOB states she didn't notice her own symptoms of depression in the past, but that her family recognized that she was exhibiting anger, which was concluded to be a depressive symptom.  She states she took Zoloft, but went off of it well before getting pregnant because she read of the birth defects it causes.  CSW explained that if MOB feels she needs to go back on an antidepressant, Zoloft is safe to take while breast feeding and encouraged her to speak with her doctor or a lactation consultant if she would like.  MOB seemed very hesitant about this.  CSW asked MOB to understand CSW's viewpoint in saying that while an advocate of breast feeding, CSW believes that MOB's mental health and stability   is of utmost importance.  If she experiences PPD symptoms requiring treatment with an antidepressant, CSW would like her to consider putting her mental health above proving breast milk.  MOB was in agreement.  CSW feels MOB is at increased risk of developing PPD given her hx of loss and baby's admission to NICU.   CSW explained ongoing support services offered by NICU CSW and encouraged MOB to contact CSW any time she feels she would like to process her feelings.  CSW encouraged her to allow herself to be emotional, but to keep a check on her emotions throughout the next few days, weeks and months.  MOB agreed and seemed very appreciative of CSW's  concern for her emotional wellbeing.  MGM entered the room towards the end of the conversation and MOB introduced CSW to her mother.  MGM was friendly and seemed appreciative of the support offered to her daughter.  MOB reports having a great support system of family living locally in the area and states she and her husband have all necessary baby items for RJ at home.  CSW thanked MOB for sharing with CSW today and provided her with contact information.  CSW Plan/Description:  Patient/Family Education , Psychosocial Support and Ongoing Assessment of Needs    Amario Longmore Elizabeth, LCSW 08/27/2014, 1:50 PM 

## 2014-08-27 NOTE — Lactation Note (Signed)
This note was copied from the chart of Morgan Morgan Galloway. Lactation Consultation Note  Met with mom prior to her discharge.  She has been pumping every 3 hours and obtaining a few mls of colostrum.  [place] breastfeeding hotline phone number given.  Mom will call to pick up pump today.  Baby has been receiving formula supplementation in the NICU for low blood sugars.  Encouraged her to have baby's nurse call me so I can assist her with latching baby today.  Patient Name: Morgan Galloway [id number]'X Date: 08/27/2014     Maternal Data    Feeding    LATCH Score/Interventions                      Lactation Tools Discussed/Used     Consult Status      Ave Filter 08/27/2014, 10:18 AM

## 2014-08-27 NOTE — Discharge Summary (Signed)
Vaginal Delivery Discharge Summary  [name]  DOB:    [birth year] MRN:    [id number] CSN:    [id number]  Date of admission:                  08/25/2014  Date of discharge:                   08/28/2014  Procedures this admission:  Date of Delivery: 08/25/2014 normal spontaneous vaginal delivery by Nigel Bridgeman, certified nurse midwife  Newborn Data:  Live born female  Birth Weight: 4 lb 10.1 oz (2101 g) APGAR: 9, 9  The infant will remain in the neonatal intensive care unit until ready for discharge. Name: Morgan Galloway Circumcision Plan: Outpatient  History of Present Illness:  [name] is a 24 y.o. female, [phone], who presents at [redacted]w[redacted]d weeks gestation. The patient has been followed at the [facility] and Gynecology division of [facility]. She was admitted induction of labor. Her pregnancy has been complicated by: History of an intrauterine fetal demise at [redacted] weeks gestation, intrauterine growth retardation, abnormal Doppler studies, anemia.  Hospital course:  The patient was admitted for induction.   She was given Pitocin. Her membranes were ruptured. Her labor was not complicated. She proceeded to have a vaginal delivery of a healthy infant. Her delivery was not complicated. Her postpartum course was not complicated. Her son was admitted to the neonatal intensive care unit for observation. She pumped her breasts. She was discharged to home on postpartum day 2 doing well.  Feeding:  breast  Contraception:  Depo-Provera  Discharge hemoglobin:  HEMOGLOBIN  Date Value Ref Range Status  08/26/2014 11.4* 12.0 - 15.0 g/dL Final  19/41/7408 14.4 12.2 - 16.2 g/dL Final   HCT  Date Value Ref Range Status  08/26/2014 33.2* 36.0 - 46.0 % Final   HCT, POC  Date Value Ref Range Status  12/23/2013 41.8 37.7 - 47.9 % Final    Discharge Physical Exam:   General: alert and no distress Lochia: appropriate Uterine  Fundus: firm Incision: Not applicable DVT Evaluation: No evidence of DVT seen on physical exam.  Intrapartum Procedures: spontaneous vaginal delivery Postpartum Procedures: none Complications-Operative and Postpartum: First-degree periurethral laceration  Discharge Diagnoses: Term Pregnancy-delivered and History of an intrauterine fetal demise at [redacted] weeks gestation, intrauterine growth retardation for this pregnancy, abnormal Doppler studies, anemia.  Discharge Information:  Activity:           pelvic rest Diet:                routine Medications: PNV, Ibuprofen, Colace, Iron, Percocet and Depo-Provera Condition:      stable and improved Instructions:   Postpartum Care After Vaginal Delivery  After you deliver your newborn (postpartum period), the usual stay in the hospital is 24 72 hours. If there were problems with your labor or delivery, or if you have other medical problems, you might be in the hospital longer.  While you are in the hospital, you will receive help and instructions on how to care for yourself and your newborn during the postpartum period.  While you are in the hospital:  Be sure to tell your nurses if you have pain or discomfort, as well as where you feel the pain and what makes the pain worse.  If you had an incision made near your vagina (episiotomy) or if you had some tearing during delivery, the nurses may put ice packs on  your episiotomy or tear. The ice packs may help to reduce the pain and swelling.  If you are breastfeeding, you may feel uncomfortable contractions of your uterus for a couple of weeks. This is normal. The contractions help your uterus get back to normal size.  It is normal to have some bleeding after delivery.  For the first 1 3 days after delivery, the flow is red and the amount may be similar to a period.  It is common for the flow to start and stop.  In the first few days, you may pass some small clots. Let your nurses know if you  begin to pass large clots or your flow increases.  Do not  flush blood clots down the toilet before having the nurse look at them.  During the next 3 10 days after delivery, your flow should become more watery and pink or brown-tinged in color.  Ten to fourteen days after delivery, your flow should be a small amount of yellowish-white discharge.  The amount of your flow will decrease over the first few weeks after delivery. Your flow may stop in 6 8 weeks. Most women have had their flow stop by 12 weeks after delivery.  You should change your sanitary pads frequently.  Wash your hands thoroughly with soap and water for at least 20 seconds after changing pads, using the toilet, or before holding or feeding your newborn.  You should feel like you need to empty your bladder within the first 6 8 hours after delivery.  In case you become weak, lightheaded, or faint, call your nurse before you get out of bed for the first time and before you take a shower for the first time.  Within the first few days after delivery, your breasts may begin to feel tender and full. This is called engorgement. Breast tenderness usually goes away within 48 72 hours after engorgement occurs. You may also notice milk leaking from your breasts. If you are not breastfeeding, do not stimulate your breasts. Breast stimulation can make your breasts produce more milk.  Spending as much time as possible with your newborn is very important. During this time, you and your newborn can feel close and get to know each other. Having your newborn stay in your room (rooming in) will help to strengthen the bond with your newborn. It will give you time to get to know your newborn and become comfortable caring for your newborn.  Your hormones change after delivery. Sometimes the hormone changes can temporarily cause you to feel sad or tearful. These feelings should not last more than a few days. If these feelings last longer than that,  you should talk to your caregiver.  If desired, talk to your caregiver about methods of family planning or contraception.  Talk to your caregiver about immunizations. Your caregiver may want you to have the following immunizations before leaving the hospital:  Tetanus, diphtheria, and pertussis (Tdap) or tetanus and diphtheria (Td) immunization. It is very important that you and your family (including grandparents) or others caring for your newborn are up-to-date with the Tdap or Td immunizations. The Tdap or Td immunization can help protect your newborn from getting ill.  Rubella immunization.  Varicella (chickenpox) immunization.  Influenza immunization. You should receive this annual immunization if you did not receive the immunization during your pregnancy. Document Released: 02/05/2007 Document Revised: 01/03/2012 Document Reviewed: 12/06/2011 [facility] Patient Information 2014 [place], [place].   Postpartum Depression and Baby Blues  The postpartum period begins  right after the birth of a baby. During this time, there is often a great amount of joy and excitement. It is also a time of considerable changes in the life of the parent(s). Regardless of how many times a mother gives birth, each child brings new challenges and dynamics to the family. It is not unusual to have feelings of excitement accompanied by confusing shifts in moods, emotions, and thoughts. All mothers are at risk of developing postpartum depression or the "baby blues." These mood changes can occur right after giving birth, or they may occur many months after giving birth. The baby blues or postpartum depression can be mild or severe. Additionally, postpartum depression can resolve rather quickly, or it can be a long-term condition. CAUSES Elevated hormones and their rapid decline are thought to be a main cause of postpartum depression and the baby blues. There are a number of hormones that radically change during and after  pregnancy. Estrogen and progesterone usually decrease immediately after delivering your baby. The level of thyroid hormone and various cortisol steroids also rapidly drop. Other factors that play a major role in these changes include major life events and genetics.  RISK FACTORS If you have any of the following risks for the baby blues or postpartum depression, know what symptoms to watch out for during the postpartum period. Risk factors that may increase the likelihood of getting the baby blues or postpartum depression include: 1. Havinga personal or family history of depression. 2. Having depression while being pregnant. 3. Having premenstrual or oral contraceptive-associated mood issues. 4. Having exceptional life stress. 5. Having marital conflict. 6. Lacking a social support network. 7. Having a baby with special needs. 8. Having health problems such as diabetes. SYMPTOMS Baby blues symptoms include:  Brief fluctuations in mood, such as going from extreme happiness to sadness.  Decreased concentration.  Difficulty sleeping.  Crying spells, tearfulness.  Irritability.  Anxiety. Postpartum depression symptoms typically begin within the first month after giving birth. These symptoms include:  Difficulty sleeping or excessive sleepiness.  Marked weight loss.  Agitation.  Feelings of worthlessness.  Lack of interest in activity or food. Postpartum psychosis is a very concerning condition and can be dangerous. Fortunately, it is rare. Displaying any of the following symptoms is cause for immediate medical attention. Postpartum psychosis symptoms include:  Hallucinations and delusions.  Bizarre or disorganized behavior.  Confusion or disorientation. DIAGNOSIS  A diagnosis is made by an evaluation of your symptoms. There are no medical or lab tests that lead to a diagnosis, but there are various questionnaires that a caregiver may use to identify those with the baby blues,  postpartum depression, or psychosis. Often times, a screening tool called the [place] Postnatal Depression Scale is used to diagnose depression in the postpartum period.  TREATMENT The baby blues usually goes away on its own in 1 to 2 weeks. Social support is often all that is needed. You should be encouraged to get adequate sleep and rest. Occasionally, you may be given medicines to help you sleep.  Postpartum depression requires treatment as it can last several months or longer if it is not treated. Treatment may include individual or group therapy, medicine, or both to address any social, physiological, and psychological factors that may play a role in the depression. Regular exercise, a healthy diet, rest, and social support may also be strongly recommended.  Postpartum psychosis is more serious and needs treatment right away. Hospitalization is often needed. HOME CARE INSTRUCTIONS  Get as  much rest as you can. Nap when the baby sleeps.  Exercise regularly. Some women find yoga and walking to be beneficial.  Eat a balanced and nourishing diet.  Do little things that you enjoy. Have a cup of tea, take a bubble bath, read your favorite magazine, or listen to your favorite music.  Avoid alcohol.  Ask for help with household chores, cooking, grocery shopping, or running errands as needed. Do not try to do everything.  Talk to people close to you about how you are feeling. Get support from your partner, family members, friends, or other new moms.  Try to stay positive in how you think. Think about the things you are grateful for.  Do not spend a lot of time alone.  Only take medicine as directed by your caregiver.  Keep all your postpartum appointments.  Let your caregiver know if you have any concerns. SEEK MEDICAL CARE IF: You are having a reaction or problems with your medicine. SEEK IMMEDIATE MEDICAL CARE IF:  You have suicidal feelings.  You feel you may harm the baby or  someone else. Document Released: 01/13/2004 Document Revised: 07/03/2011 Document Reviewed: 02/14/2011 [facility] Patient Information 2014 [place], [place].   Discharge to: home  Follow-up Information    Follow up with [facility] In 6 weeks.   Contact information:   [address], [address] [place] [phone]        Janine LimboSTRINGER,Lemario Chaikin V 08/27/2014

## 2014-08-28 ENCOUNTER — Ambulatory Visit: Payer: Self-pay

## 2014-08-28 NOTE — Lactation Note (Signed)
This note was copied from the chart of Morgan Rolonda Galloway. Lactation Consultation Note follow up visit made in NICU to assist mom with pumping.  Replaced some pumping pieces and pump now working well.  Breasts are full but soft.  [facility]  Discussed attempting latching baby with LC assist this afternoon.  Mom would like to latch baby and will have RN page.  Patient Name: Morgan Galloway ZOXWR'UToday'Galloway Date: 08/28/2014     Maternal Data    Feeding Feeding Type: Formula  LATCH Score/Interventions                      Lactation Tools Discussed/Used     Consult Status      Huston FoleyMOULDEN, Morgan Galloway 08/28/2014, 12:47 PM

## 2014-09-08 ENCOUNTER — Ambulatory Visit: Payer: Self-pay

## 2014-09-08 NOTE — Lactation Note (Signed)
This note was copied from the chart of Morgan Maylani Galloway. Lactation Consultation Note  Patient Name: Morgan Galloway MWNUU'VToday's Date: 09/08/2014 Reason for consult: Follow-up assessment;NICU baby NICU baby 2 wk.o. Mom states that pumping is going well and she has a very good supply of milk. Mom states that she has no questions/concerns at this time. Enc mom to call if LC can help in any way.   Maternal Data    Feeding    LATCH Score/Interventions                      Lactation Tools Discussed/Used     Consult Status Consult Status: PRN    Geralynn OchsWILLIARD, [name] 09/08/2014, 6:09 PM

## 2015-06-03 ENCOUNTER — Other Ambulatory Visit: Payer: Self-pay | Admitting: Obstetrics and Gynecology

## 2015-06-03 DIAGNOSIS — N6313 Unspecified lump in the right breast, lower outer quadrant: Secondary | ICD-10-CM

## 2015-06-09 ENCOUNTER — Inpatient Hospital Stay: Admission: RE | Admit: 2015-06-09 | Payer: Medicaid Other | Source: Ambulatory Visit

## 2015-09-20 ENCOUNTER — Ambulatory Visit (INDEPENDENT_AMBULATORY_CARE_PROVIDER_SITE_OTHER): Payer: Medicaid Other

## 2015-09-20 ENCOUNTER — Encounter (HOSPITAL_COMMUNITY): Payer: Self-pay | Admitting: *Deleted

## 2015-09-20 ENCOUNTER — Ambulatory Visit (HOSPITAL_COMMUNITY)
Admission: EM | Admit: 2015-09-20 | Discharge: 2015-09-20 | Disposition: A | Discharge status: Home / Self Care | Payer: Medicaid Other | Attending: Family Medicine | Admitting: Family Medicine

## 2015-09-20 DIAGNOSIS — S6991XA Unspecified injury of right wrist, hand and finger(s), initial encounter: Secondary | ICD-10-CM

## 2015-09-20 MED ORDER — CEPHALEXIN 500 MG PO CAPS: 500.0000 mg | ORAL_CAPSULE | Freq: Four times a day (QID) | ORAL | Status: DC

## 2015-09-20 NOTE — ED Notes (Signed)
Reports right middle finger getting completely shut in door of an [employer].  Swelling and lacerated skin noted to distal aspect.

## 2015-09-20 NOTE — Discharge Instructions (Signed)
Nail Avulsion Injury Nail avulsion means that you have lost the whole, or part of a nail. The nail will usually grow back in 2 to 6 months. If your injury damaged the growth center of the nail, the nail may be deformed, split, or not stuck to the nail bed. Sometimes the avulsed nail is stitched back in place. This provides temporary protection to the nail bed until the new nail grows in.  HOME CARE INSTRUCTIONS   Raise (elevate) your injury as much as possible.  Protect the injury and cover it with bandages (dressings) or splints as instructed.  Change dressings as instructed. SEEK MEDICAL CARE IF:   There is increasing pain, redness, or swelling.  You cannot move your fingers or toes.   This information is not intended to replace advice given to you by your health care provider. Make sure you discuss any questions you have with your health care provider.   Document Released: 05/18/2004 Document Revised: 07/03/2011 Document Reviewed: 03/12/2009 Elsevier Interactive Patient Education 2016 Elsevier Inc. Crush Injury, Fingers or Toes A crush injury means the fingers or toes are hurt by being squeezed (compressed). HOME CARE  Raise (elevate) the injured part above the level of your heart. Do this as much as you can for the first few days.  Put ice on the injured area.  Put ice in a plastic bag.  Place a towel between your skin and the bag.  Leave the ice on for 15-20 minutes, 03-04 times a day for the first 2 days.  Only take medicine as told by your doctor.  Use the injured part only as told by your doctor.  Change bandages (dressings) as told by your doctor.  Keep all doctor visits as told. GET HELP RIGHT AWAY IF:   There is redness, puffiness (swelling), or more pain in the injured finger or toe.  Yellowish-white fluid (pus) comes from the wound.  You have a fever.  A bad smell comes from the wound or bandage.  The wound breaks open.  You cannot move the injured  finger or toe. MAKE SURE YOU:   Understand these instructions.  Will watch your condition.  Will get help right away if you are not doing well or get worse.   This information is not intended to replace advice given to you by your health care provider. Make sure you discuss any questions you have with your health care provider.   Document Released: 09/28/2009 Document Revised: 07/03/2011 Document Reviewed: 08/26/2010 Elsevier Interactive Patient Education [employer] Elsevier Inc.

## 2015-09-21 NOTE — ED Provider Notes (Signed)
CSN: [id number]     Arrival date & time 09/20/15  1943 History   First MD Initiated Contact with Patient 09/20/15 2014     Chief Complaint  Patient presents with  . Finger Injury   (Consider location/radiation/quality/duration/timing/severity/associated sxs/prior Treatment) HPI History obtained from patient:  Pt presents with the cc of:  Right long finger injury Duration of symptoms: about 1 hour Treatment prior to arrival: none Context: closed in car door Other symptoms include: none Pain score: 4 FAMILY HISTORY: mental health issues mother and father     Past Medical History  Diagnosis Date  . History of stillbirth     June 2012  . Gonorrhea   . Headache   . Infection     UTI  . Vaginal Pap smear, abnormal     f/u ok  . Depression     Zoloft in past; stopped due to potential of pregnancy.   Past Surgical History  Procedure Laterality Date  . No past surgeries     Family History  Problem Relation Age of Onset  . Other Neg Hx   . Cancer Maternal Grandmother   . Diabetes Maternal Grandmother   . Stroke Father 165  . Hypertension Father   . Bipolar disorder Mother   . Bipolar disorder Sister    Social History  Substance Use Topics  . Smoking status: Current Every Day Smoker -- 0.50 packs/day    Types: Cigarettes    Last Attempt to Quit: 12/20/2013  . Smokeless tobacco: Never Used     Comment: quit with preg  . Alcohol Use: Yes     Comment: occasionally   OB History    Gravida Para Term Preterm AB TAB SAB Ectopic Multiple Living   4 2 1 1 2  2   0 1     Review of Systems  Denies: HEADACHE, NAUSEA, ABDOMINAL PAIN, CHEST PAIN, CONGESTION, DYSURIA, SHORTNESS OF BREATH  Allergies  Review of patient's allergies indicates no known allergies.  Home Medications   Prior to Admission medications   Medication Sig Start Date End Date Taking? Authorizing Provider  CIPROFLOXACIN HCL PO Take by mouth.   Yes Historical Provider, MD  medroxyPROGESTERone  (DEPO-PROVERA) 150 MG/ML injection Inject 1 mL (150 mg total) into the muscle every 3 (three) months. 08/27/14  Yes Kirkland HunArthur Stringer, MD  calcium carbonate (TUMS - DOSED IN MG ELEMENTAL CALCIUM) 500 MG chewable tablet Chew 1 tablet by mouth daily.    Historical Provider, MD  cephALEXin (KEFLEX) 500 MG capsule Take 1 capsule (500 mg total) by mouth 4 (four) times daily. 09/20/15   Tharon AquasFrank C Dandre Sisler, PA  docusate sodium (COLACE) 100 MG capsule Take 1 capsule (100 mg total) by mouth 2 (two) times daily. 08/27/14   Kirkland HunArthur Stringer, MD  ferrous sulfate (FERROUSUL) 325 (65 FE) MG tablet Take 1 tablet (325 mg total) by mouth 2 (two) times daily with a meal. 08/27/14   Kirkland HunArthur Stringer, MD  ibuprofen (ADVIL,MOTRIN) 800 MG tablet Take 1 tablet (800 mg total) by mouth every 8 (eight) hours as needed. 08/27/14   Kirkland HunArthur Stringer, MD  oxyCODONE-acetaminophen (ROXICET) 5-325 MG per tablet Take 1 tablet by mouth every 4 (four) hours as needed for severe pain. 08/27/14   Kirkland HunArthur Stringer, MD  Prenatal Vit w/Fe-Methylfol-FA (PNV PO) Take by mouth.    Historical Provider, MD   Meds Ordered and Administered this Visit  Medications - No data to display  BP 136/75 mmHg  Pulse 73  Temp(Src) 98.3 F (36.8  C) (Oral)  Resp 12  SpO2 100% No data found.   Physical Exam NURSES NOTES AND VITAL SIGNS REVIEWED. CONSTITUTIONAL: Well developed, well nourished, no acute distress HEENT: normocephalic, atraumatic EYES: Conjunctiva normal NECK:normal ROM, supple, no adenopathy PULMONARY:No respiratory distress, normal effort ABDOMINAL: Soft, ND, NT BS+, No CVAT MUSCULOSKELETAL: Normal ROM of all extremities, Right long finger: there is a small laceration at the base of the nail, tender to palpation. No active bleeding.  SKIN: warm and dry without rash PSYCHIATRIC: Mood and affect, behavior are normal   ED Course  Procedures (including critical care time)  Labs Review Labs Reviewed - No data to display  Imaging Review Dg Finger  Middle Right  09/20/2015  CLINICAL DATA:  Swollen nail and bruising after middle finger slammed in car. EXAM: RIGHT MIDDLE FINGER 2+V COMPARISON:  None. FINDINGS: There is no evidence of fracture or dislocation. There is no evidence of arthropathy or other focal bone abnormality. Soft tissue laceration is seen dorsal to distal phalanx. IMPRESSION: No fracture or dislocation is noted. No radiopaque foreign body is noted. Dorsal soft tissue laceration is noted. Electronically Signed   By: Lupita Raider, M.D.   On: 09/20/2015 20:37   Independtly reviewed and is part of MDM.  Visual Acuity Review  Right Eye Distance:   Left Eye Distance:   Bilateral Distance:    Right Eye Near:   Left Eye Near:    Bilateral Near:         MDM   1. Finger injury, right, initial encounter     Patient is reassured that there are no issues that require transfer to higher level of care at this time or additional tests. Patient is advised to continue home symptomatic treatment. Patient is advised that if there are new or worsening symptoms to attend the emergency department, contact primary care provider, or return to UC. Instructions of care provided discharged home in stable condition.    THIS NOTE WAS GENERATED USING A VOICE RECOGNITION SOFTWARE PROGRAM. ALL REASONABLE EFFORTS  WERE MADE TO PROOFREAD THIS DOCUMENT FOR ACCURACY.  I have verbally reviewed the discharge instructions with the patient. A printed AVS was given to the patient.  All questions were answered prior to discharge.      Tharon Aquas, PA 09/21/15 1006

## 2016-05-19 ENCOUNTER — Inpatient Hospital Stay (HOSPITAL_COMMUNITY): Payer: Self-pay

## 2016-05-19 ENCOUNTER — Inpatient Hospital Stay (HOSPITAL_COMMUNITY)
Admission: AD | Admit: 2016-05-19 | Discharge: 2016-05-19 | Disposition: A | Discharge status: Home / Self Care | Payer: Self-pay | Source: Ambulatory Visit | Attending: Obstetrics and Gynecology | Admitting: Obstetrics and Gynecology

## 2016-05-19 ENCOUNTER — Encounter (HOSPITAL_COMMUNITY): Payer: Self-pay

## 2016-05-19 DIAGNOSIS — O26891 Other specified pregnancy related conditions, first trimester: Secondary | ICD-10-CM | POA: Insufficient documentation

## 2016-05-19 DIAGNOSIS — F17210 Nicotine dependence, cigarettes, uncomplicated: Secondary | ICD-10-CM | POA: Insufficient documentation

## 2016-05-19 DIAGNOSIS — O3680X0 Pregnancy with inconclusive fetal viability, not applicable or unspecified: Secondary | ICD-10-CM | POA: Insufficient documentation

## 2016-05-19 DIAGNOSIS — O209 Hemorrhage in early pregnancy, unspecified: Secondary | ICD-10-CM | POA: Insufficient documentation

## 2016-05-19 DIAGNOSIS — O99331 Smoking (tobacco) complicating pregnancy, first trimester: Secondary | ICD-10-CM | POA: Insufficient documentation

## 2016-05-19 DIAGNOSIS — Z6790 Unspecified blood type, Rh positive: Secondary | ICD-10-CM

## 2016-05-19 DIAGNOSIS — R109 Unspecified abdominal pain: Secondary | ICD-10-CM

## 2016-05-19 DIAGNOSIS — Z3A01 Less than 8 weeks gestation of pregnancy: Secondary | ICD-10-CM | POA: Insufficient documentation

## 2016-05-19 DIAGNOSIS — O26899 Other specified pregnancy related conditions, unspecified trimester: Secondary | ICD-10-CM

## 2016-05-19 DIAGNOSIS — R102 Pelvic and perineal pain: Secondary | ICD-10-CM | POA: Insufficient documentation

## 2016-05-19 DIAGNOSIS — O4690 Antepartum hemorrhage, unspecified, unspecified trimester: Secondary | ICD-10-CM

## 2016-05-19 LAB — CBC
HCT: 37.3 % (ref 36.0–46.0)
HEMOGLOBIN: 12.6 g/dL (ref 12.0–15.0)
MCH: 30.3 pg (ref 26.0–34.0)
MCHC: 33.8 g/dL (ref 30.0–36.0)
MCV: 89.7 fL (ref 78.0–100.0)
Platelets: 174 10*3/uL (ref 150–400)
RBC: 4.16 MIL/uL (ref 3.87–5.11)
RDW: 13 % (ref 11.5–15.5)
WBC: 6.6 10*3/uL (ref 4.0–10.5)

## 2016-05-19 LAB — URINALYSIS, ROUTINE W REFLEX MICROSCOPIC
Bacteria, UA: NONE SEEN
Bilirubin Urine: NEGATIVE
Glucose, UA: NEGATIVE mg/dL
Ketones, ur: NEGATIVE mg/dL
Nitrite: NEGATIVE
Protein, ur: 30 mg/dL — AB
Specific Gravity, Urine: 1.027 (ref 1.005–1.030)
pH: 5 (ref 5.0–8.0)

## 2016-05-19 LAB — WET PREP, GENITAL
Sperm: NONE SEEN
Trich, Wet Prep: NONE SEEN
YEAST WET PREP: NONE SEEN

## 2016-05-19 LAB — HCG, QUANTITATIVE, PREGNANCY: HCG, BETA CHAIN, QUANT, S: 156 m[IU]/mL — AB (ref <5)

## 2016-05-19 LAB — POCT PREGNANCY, URINE: Preg Test, Ur: POSITIVE — AB

## 2016-05-19 NOTE — MAU Note (Signed)
Patient had a spot of blood last night it stopped and then started back spotting this morning more, cramping as well.  LMP 04/10/16

## 2016-05-19 NOTE — MAU Provider Note (Signed)
History     CSN: [id number]  Arrival date and time: 05/19/16 0702   First Provider Initiated Contact with Patient 05/19/16 [phone]      Chief Complaint  Patient presents with  . Vaginal Bleeding   Vaginal Bleeding  The patient's primary symptoms include pelvic pain and vaginal bleeding. This is a new problem. The current episode started today. The problem occurs intermittently. The problem has been unchanged. Pain severity now: "not really pain, but a crampy feeling" The problem affects both sides. She is pregnant. Pertinent negatives include no chills, constipation, diarrhea, dysuria, fever, frequency, nausea, urgency or vomiting. The vaginal discharge was bloody. The vaginal bleeding is spotting. She has not been passing clots. She has not been passing tissue. Nothing aggravates the symptoms. She has tried nothing for the symptoms. Menstrual history: LMP: 04/10/16    Past Medical History:  Diagnosis Date  . Depression    Zoloft in past; stopped due to potential of pregnancy.  . Gonorrhea   . Headache   . History of stillbirth    June 2012  . Infection    UTI  . Vaginal Pap smear, abnormal    f/u ok    Past Surgical History:  Procedure Laterality Date  . NO PAST SURGERIES      Family History  Problem Relation Age of Onset  . Cancer Maternal Grandmother   . Diabetes Maternal Grandmother   . Stroke Father 49  . Hypertension Father   . Bipolar disorder Mother   . Bipolar disorder Sister   . Other Neg Hx     Social History  Substance Use Topics  . Smoking status: Current Every Day Smoker    Packs/day: 0.50    Types: Cigarettes    Last attempt to quit: 12/20/2013  . Smokeless tobacco: Never Used     Comment: quit with preg  . Alcohol use Yes     Comment: occasionally    Allergies: No Known Allergies  Prescriptions Prior to Admission  Medication Sig Dispense Refill Last Dose  . calcium carbonate (TUMS - DOSED IN MG ELEMENTAL CALCIUM) 500 MG chewable tablet Chew 1  tablet by mouth daily.   Past Week at Unknown time  . CIPROFLOXACIN HCL PO Take by mouth.     . ferrous sulfate (FERROUSUL) 325 (65 FE) MG tablet Take 1 tablet (325 mg total) by mouth 2 (two) times daily with a meal. 100 tablet 1   . ibuprofen (ADVIL,MOTRIN) 800 MG tablet Take 1 tablet (800 mg total) by mouth every 8 (eight) hours as needed. 50 tablet 1   . medroxyPROGESTERone (DEPO-PROVERA) 150 MG/ML injection Inject 1 mL (150 mg total) into the muscle every 3 (three) months. 1 mL 3   . oxyCODONE-acetaminophen (ROXICET) 5-325 MG per tablet Take 1 tablet by mouth every 4 (four) hours as needed for severe pain. 30 tablet 0   . Prenatal Vit w/Fe-Methylfol-FA (PNV PO) Take by mouth.   08/24/2014 at Unknown time    Review of Systems  Constitutional: Negative for chills and fever.  Gastrointestinal: Negative for constipation, diarrhea, nausea and vomiting.  Genitourinary: Positive for pelvic pain and vaginal bleeding. Negative for dysuria, frequency and urgency.   Physical Exam   Blood pressure 138/57, pulse 74, temperature 97.4 F (36.3 C), temperature source Oral, resp. rate 16, height 5\' 4"  (1.626 m), weight 57.6 kg (127 lb), last menstrual period 04/10/2016, unknown if currently breastfeeding.  Physical Exam  Nursing note and vitals reviewed. Constitutional: She is oriented to  person, place, and time. She appears well-developed and well-nourished. No distress.  HENT:  Head: Normocephalic.  Cardiovascular: Normal rate.   Respiratory: Effort normal.  GI: Soft. There is no tenderness. There is no rebound.  Neurological: She is alert and oriented to person, place, and time.  Skin: Skin is warm and dry.  Psychiatric: She has a normal mood and affect.   Results for orders placed or performed during the hospital encounter of 05/19/16 (from the past 24 hour(s))  Urinalysis, Routine w reflex microscopic     Status: Abnormal   Collection Time: 05/19/16  7:12 AM  Result Value Ref Range   Color,  Urine YELLOW YELLOW   APPearance HAZY (A) CLEAR   Specific Gravity, Urine 1.027 1.005 - 1.030   pH 5.0 5.0 - 8.0   Glucose, UA NEGATIVE NEGATIVE mg/dL   Hgb urine dipstick LARGE (A) NEGATIVE   Bilirubin Urine NEGATIVE NEGATIVE   Ketones, ur NEGATIVE NEGATIVE mg/dL   Protein, ur 30 (A) NEGATIVE mg/dL   Nitrite NEGATIVE NEGATIVE   Leukocytes, UA TRACE (A) NEGATIVE   RBC / HPF 6-30 0 - 5 RBC/hpf   WBC, UA 0-5 0 - 5 WBC/hpf   Bacteria, UA NONE SEEN NONE SEEN   Squamous Epithelial / LPF 6-30 (A) NONE SEEN   Mucous PRESENT   Pregnancy, urine POC     Status: Abnormal   Collection Time: 05/19/16  7:26 AM  Result Value Ref Range   Preg Test, Ur POSITIVE (A) NEGATIVE  Wet prep, genital     Status: Abnormal   Collection Time: 05/19/16  7:50 AM  Result Value Ref Range   Yeast Wet Prep HPF POC NONE SEEN NONE SEEN   Trich, Wet Prep NONE SEEN NONE SEEN   Clue Cells Wet Prep HPF POC PRESENT (A) NONE SEEN   WBC, Wet Prep HPF POC FEW (A) NONE SEEN   Sperm NONE SEEN   CBC     Status: None   Collection Time: 05/19/16  9:04 AM  Result Value Ref Range   WBC 6.6 4.0 - 10.5 K/uL   RBC 4.16 3.87 - 5.11 MIL/uL   Hemoglobin 12.6 12.0 - 15.0 g/dL   HCT 16.1 09.6 - 04.5 %   MCV 89.7 78.0 - 100.0 fL   MCH 30.3 26.0 - 34.0 pg   MCHC 33.8 30.0 - 36.0 g/dL   RDW 40.9 81.1 - 91.4 %   Platelets 174 150 - 400 K/uL  hCG, quantitative, pregnancy     Status: Abnormal   Collection Time: 05/19/16  9:04 AM  Result Value Ref Range   hCG, Beta Chain, Quant, S 156 (H) <5 mIU/mL   US Ob Comp Less 14 Wks  Result Date: 05/19/2016 CLINICAL DATA:  Vaginal bleeding and pelvic pain in first trimester pregnancy. Gestational age by LMP of 5 weeks 4 days. EXAM: OBSTETRIC <14 WK [place] AND TRANSVAGINAL OB US TECHNIQUE: Both transabdominal and transvaginal ultrasound examinations were performed for complete evaluation of the gestation as well as the maternal uterus, adnexal regions, and pelvic cul-de-sac. Transvaginal technique  was performed to assess early pregnancy. COMPARISON:  None. FINDINGS: Intrauterine gestational sac: None Yolk sac:  Not Visualized. Embryo:  Not Visualized. Maternal uterus/adnexae: No other fluid collections seen within the endometrial cavity. Endometrial thickness 8 mm. Small left ovarian corpus luteum noted. Normal appearance of right ovary. No evidence of adnexal mass or abnormal free fluid. IMPRESSION: No intrauterine gestational sac or adnexal mass identified, consistent with pregnancy of unknown  location. Differential diagnosis includes recent spontaneous abortion, IUP too early to visualize, and non-visualized ectopic pregnancy. Recommend close follow up of quantitative B-HCG levels, and follow up US as clinically warranted. Electronically Signed   By: Myles RosenthalJohn  Stahl M.D.   On: 05/19/2016 09:08   [place] Ob Transvaginal  Result Date: 05/19/2016 CLINICAL DATA:  Vaginal bleeding and pelvic pain in first trimester pregnancy. Gestational age by LMP of 5 weeks 4 days. EXAM: OBSTETRIC <14 WK US AND TRANSVAGINAL OB US TECHNIQUE: Both transabdominal and transvaginal ultrasound examinations were performed for complete evaluation of the gestation as well as the maternal uterus, adnexal regions, and pelvic cul-de-sac. Transvaginal technique was performed to assess early pregnancy. COMPARISON:  None. FINDINGS: Intrauterine gestational sac: None Yolk sac:  Not Visualized. Embryo:  Not Visualized. Maternal uterus/adnexae: No other fluid collections seen within the endometrial cavity. Endometrial thickness 8 mm. Small left ovarian corpus luteum noted. Normal appearance of right ovary. No evidence of adnexal mass or abnormal free fluid. IMPRESSION: No intrauterine gestational sac or adnexal mass identified, consistent with pregnancy of unknown location. Differential diagnosis includes recent spontaneous abortion, IUP too early to visualize, and non-visualized ectopic pregnancy. Recommend close follow up of quantitative B-HCG  levels, and follow up US as clinically warranted. Electronically Signed   By: Myles RosenthalJohn  Stahl M.D.   On: 05/19/2016 09:08   MAU Course  Procedures  MDM Wet prep, GC/CT collected US and labs ordered  0800 [facility] Destina Mantei, CNM Tawnya CrookHogan, Heather Donovan  05/19/16 8:08 AM   No evidence of acute abdominal or pelvic process. No IUP seen on US, cannot r/o ectopic, failed pregnancy, or early pregnancy. Will rpt quant in 2 days. Stable for discharge home.  Assessment and Plan   1. Pregnancy of unknown anatomic location   2. Vaginal bleeding in pregnancy, first trimester   3. Abdominal cramping affecting pregnancy   4. Vaginal bleeding in pregnancy   5. Rh(D) positive    Discharge home Follow up in MAU in 2 days for quant Ectopic/SAB precautions Tylenol prn Heat pad prn  Allergies as of 05/19/2016   No Known Allergies     Medication List    You have not been prescribed any medications.    Donette LarryMelanie Rodrickus Min, CNM  05/19/2016 10:25 AM

## 2016-05-19 NOTE — Discharge Instructions (Signed)
Vaginal Bleeding During Pregnancy, First Trimester °A small amount of bleeding (spotting) from the vagina is common in early pregnancy. Sometimes the bleeding is normal and is not a problem, and sometimes it is a sign of something serious. Be sure to tell your doctor about any bleeding from your vagina right away. °Follow these instructions at home: °· Watch your condition for any changes. °· Follow your doctor's instructions about how active you can be. °· If you are on bed rest: °¨ You may need to stay in bed and only get up to use the bathroom. °¨ You may be allowed to do some activities. °¨ If you need help, make plans for someone to help you. °· Write down: °¨ The number of pads you use each day. °¨ How often you change pads. °¨ How soaked (saturated) your pads are. °· Do not use tampons. °· Do not douche. °· Do not have sex or orgasms until your doctor says it is okay. °· If you pass any tissue from your vagina, save the tissue so you can show it to your doctor. °· Only take medicines as told by your doctor. °· Do not take aspirin because it can make you bleed. °· Keep all follow-up visits as told by your doctor. °Contact a doctor if: °· You bleed from your vagina. °· You have cramps. °· You have labor pains. °· You have a fever that does not go away after you take medicine. °Get help right away if: °· You have very bad cramps in your back or belly (abdomen). °· You pass large clots or tissue from your vagina. °· You bleed more. °· You feel light-headed or weak. °· You pass out (faint). °· You have chills. °· You are leaking fluid or have a gush of fluid from your vagina. °· You pass out while pooping (having a bowel movement). °This information is not intended to replace advice given to you by your health care provider. Make sure you discuss any questions you have with your health care provider. °Document Released: 08/25/2013 Document Revised: 09/16/2015 Document Reviewed: 12/16/2012 °Elsevier Interactive  Patient Education © 2017 [employer] ° °

## 2016-05-20 LAB — HIV ANTIBODY (ROUTINE TESTING W REFLEX): HIV SCREEN 4TH GENERATION: NONREACTIVE

## 2016-05-20 LAB — RPR: RPR: NONREACTIVE

## 2016-05-21 ENCOUNTER — Inpatient Hospital Stay (HOSPITAL_COMMUNITY)
Admission: AD | Admit: 2016-05-21 | Discharge: 2016-05-21 | Disposition: A | Discharge status: Home / Self Care | Payer: Self-pay | Source: Ambulatory Visit | Attending: Obstetrics & Gynecology | Admitting: Obstetrics & Gynecology

## 2016-05-21 DIAGNOSIS — O039 Complete or unspecified spontaneous abortion without complication: Secondary | ICD-10-CM

## 2016-05-21 DIAGNOSIS — Z3A01 Less than 8 weeks gestation of pregnancy: Secondary | ICD-10-CM | POA: Insufficient documentation

## 2016-05-21 DIAGNOSIS — O209 Hemorrhage in early pregnancy, unspecified: Secondary | ICD-10-CM | POA: Insufficient documentation

## 2016-05-21 LAB — HCG, QUANTITATIVE, PREGNANCY: hCG, Beta Chain, Quant, S: 92 m[IU]/mL — ABNORMAL HIGH (ref <5)

## 2016-05-21 NOTE — Discharge Instructions (Signed)

## 2016-05-21 NOTE — MAU Note (Signed)
Vaginal bleeding got heavier more like a period, denies pain, here for repeat HCG

## 2016-05-21 NOTE — MAU Provider Note (Signed)
[name]  is a 26 y.o. [phone] at 2462w6d who presents to MAU today for follow-up quant hCG after 48 hours. She states bleeding increased yesterday and now is lighter today. She denies pain or fever.   BP 131/57   Pulse 86   Temp 98.2 F (36.8 C)   Resp 16   LMP 04/10/2016   CONSTITUTIONAL: Well-developed, well-nourished female in no acute distress.  ENT: External right and left ear normal.  EYES: EOM intact, conjunctivae normal.  MUSCULOSKELETAL: Normal range of motion.  CARDIOVASCULAR: Regular heart rate RESPIRATORY: Normal effort NEUROLOGICAL: Alert and oriented to person, place, and time.  SKIN: Skin is warm and dry. No rash noted. Not diaphoretic. No erythema. No pallor. PSYCH: Normal mood and affect. Normal behavior. Normal judgment and thought content.  Results for Morgan BowenDMENDSON, Keela A (MRN [id number]) as of 05/21/2016 14:22  Ref. Range 05/19/2016 09:04 05/21/2016 13:34  HCG, Beta Chain, Quant, S Latest Ref Range: <5 mIU/mL 156 (H) 92 (H)   A: SAB  P: Discharge home Bleeding precautions discussed Patient will follow-up in [facility] in 2 week for follow-up after SAB and repeat hCG  Patient may return to MAU as needed or if her condition were to change or worsen   Marny LowensteinJulie N Wenzel, PA-C 05/21/2016 2:22 PM

## 2016-05-23 LAB — GC/CHLAMYDIA PROBE AMP (~~LOC~~) NOT AT ARMC
Chlamydia: NEGATIVE
Neisseria Gonorrhea: NEGATIVE

## 2016-06-06 ENCOUNTER — Encounter: Payer: Self-pay | Admitting: Student

## 2016-06-29 IMAGING — US US OB DETAIL+14 WK
1 series · 12 of 28 positions shown · non-contrast
Comparison: none

[Series 1: us ob detail+14 wk · 81 acquisitions, 12 frames shown]
[im 3/81]
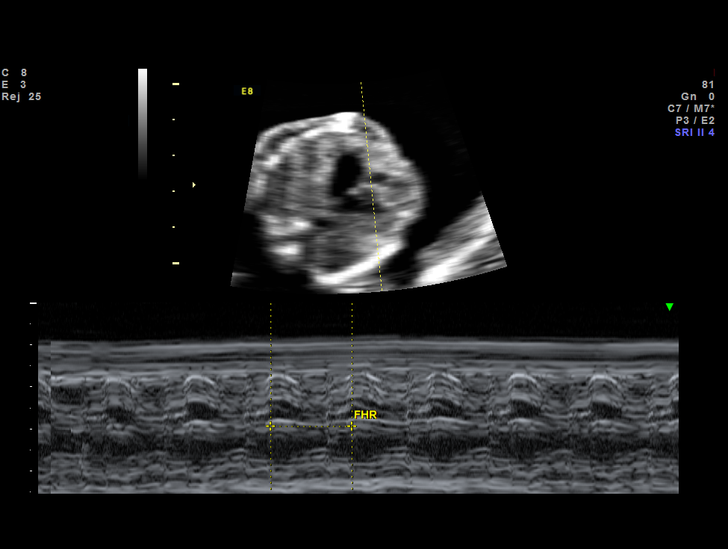
[im 9/81]
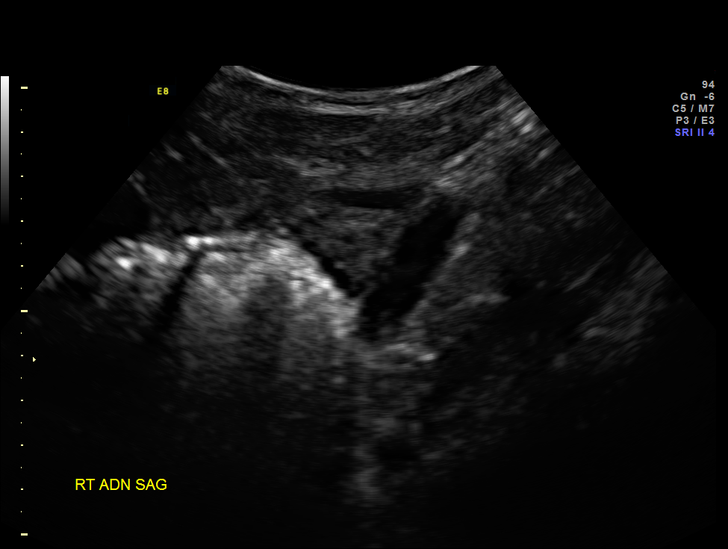
[im 15/81]
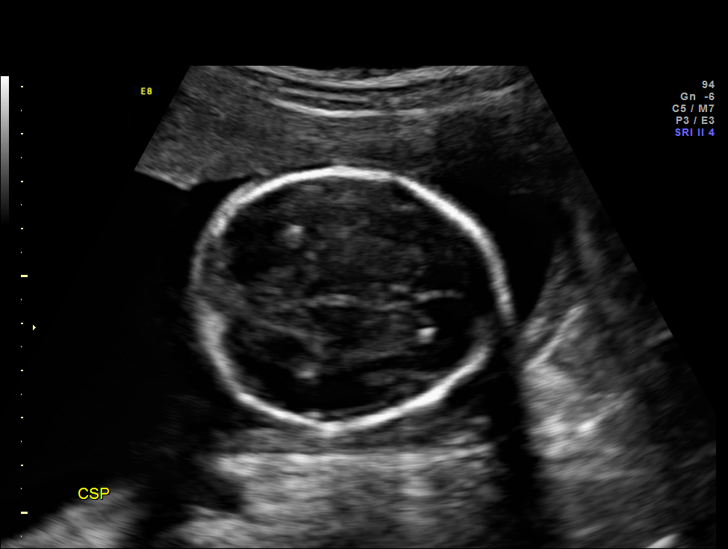
[im 24/81]
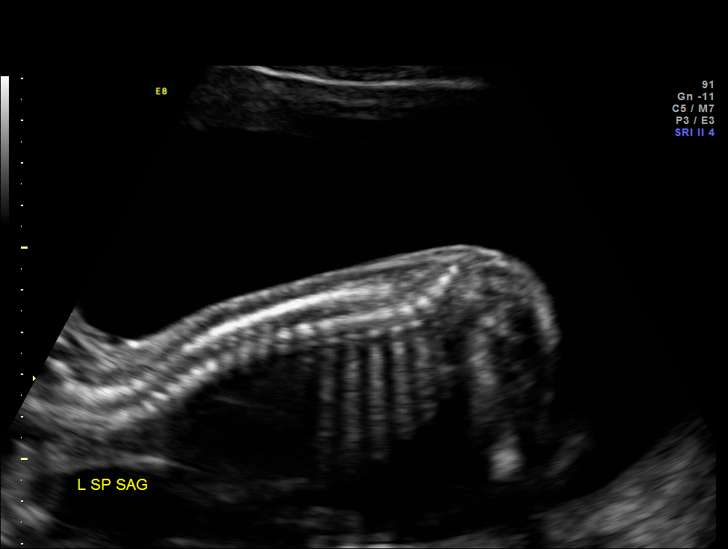
[im 30/81]
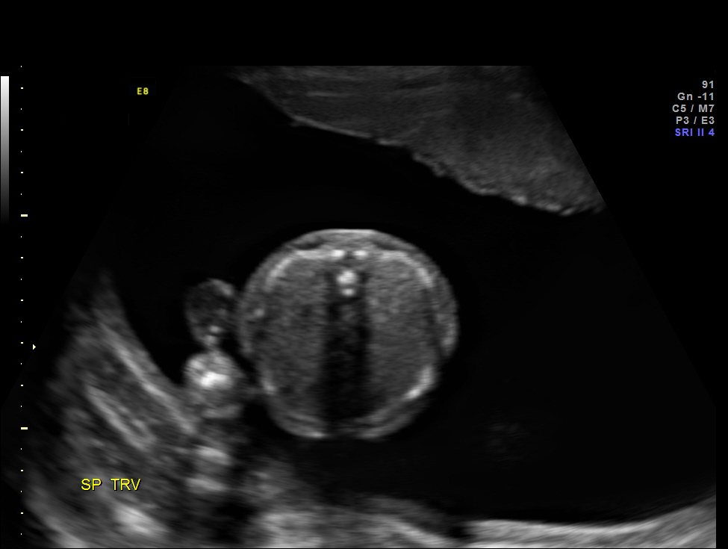
[im 36/81]
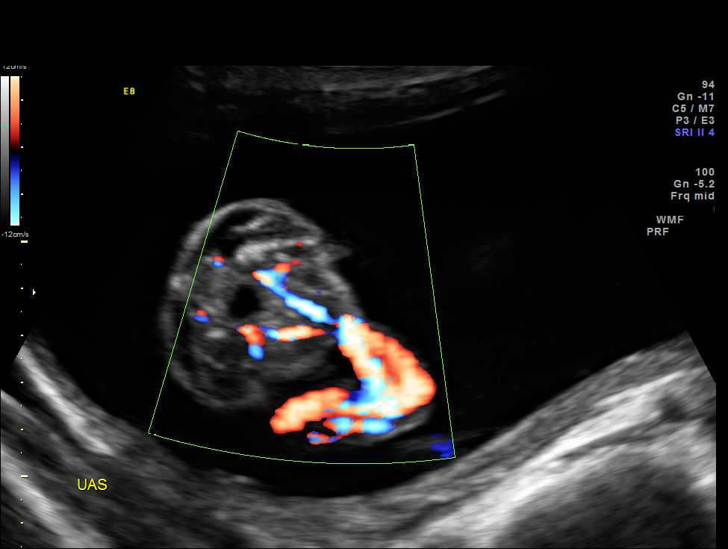
[im 45/81]
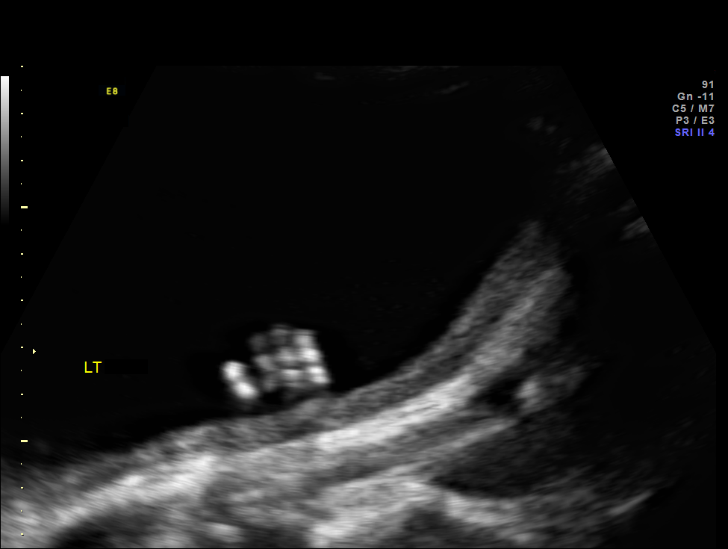
[im 51/81]
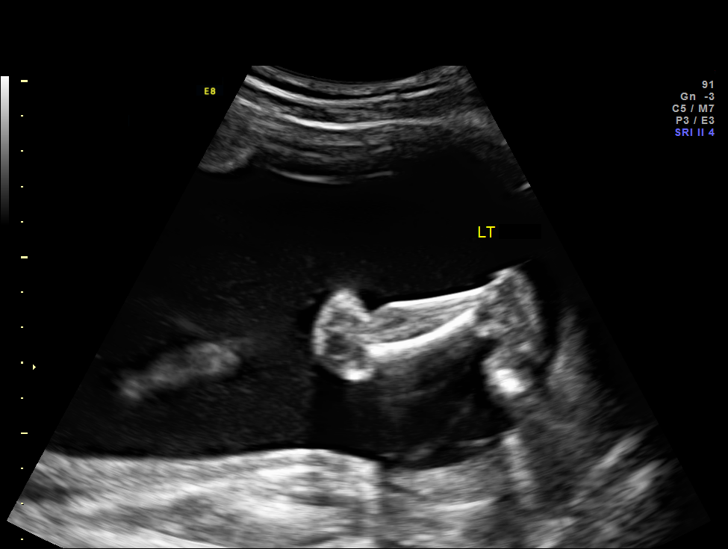
[im 57/81]
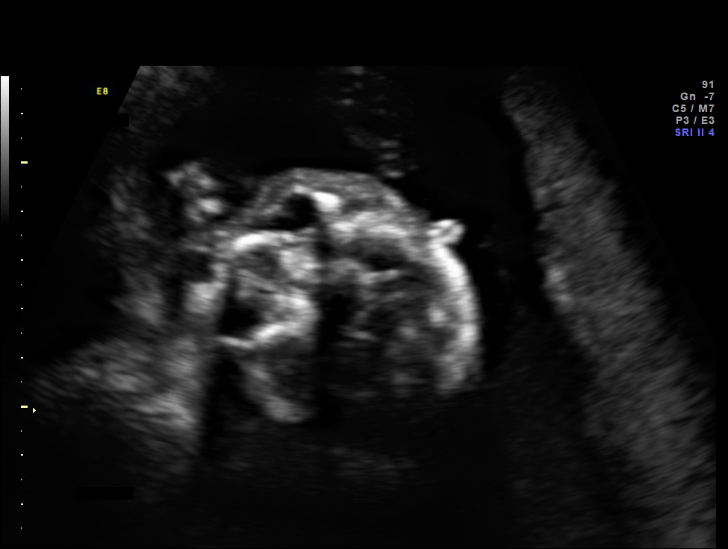
[im 66/81]
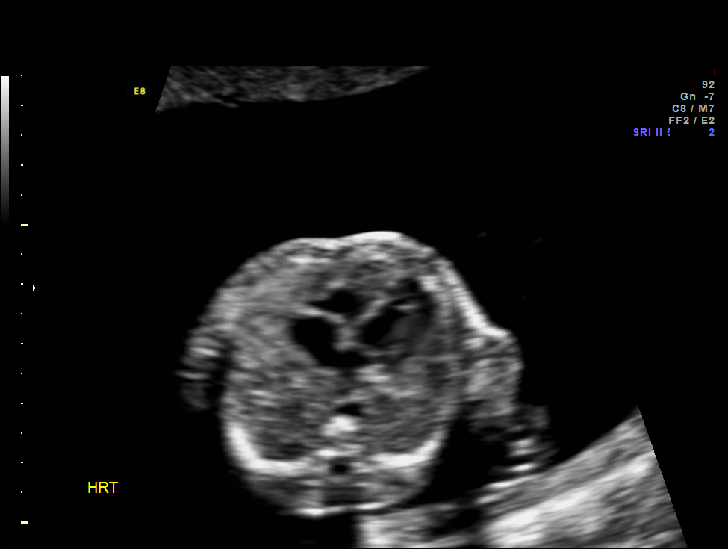
[im 72/81]
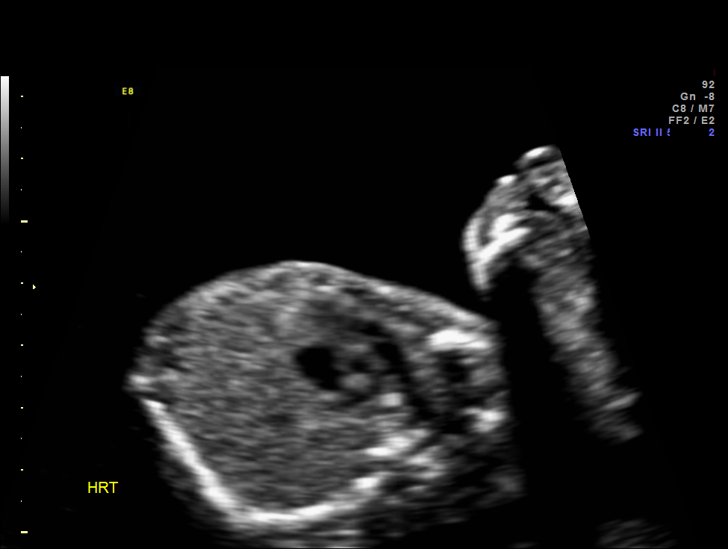
[im 78/81]
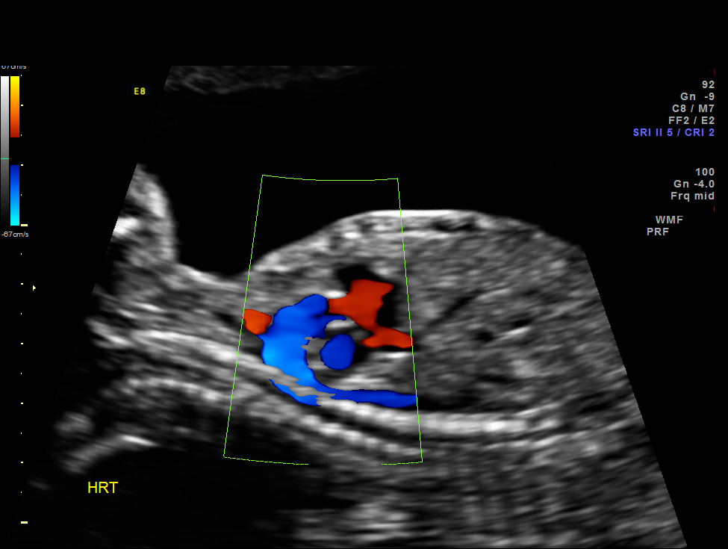

[12 of 28 positions shown; findings below may reference images not displayed]

OBSTETRICS REPORT
                      (Signed Final 05/05/2014 [DATE])

Service(s) Provided

 US OB DETAIL + 14 WK                                  76811.0
Indications

 21 weeks gestation of pregnancy
 Detailed fetal anatomic survey                        Z36
 Polyhydramnios, second trimester, antepartum
 condition or complication, fetus unspecified
 Poor obstetric history: Previous IUFD (24 weeks)
Fetal Evaluation

 Num Of Fetuses:    1
 Fetal Heart Rate:  124                          bpm
 Cardiac Activity:  Observed
 Presentation:      Breech
 Placenta:          Anterior, above cervical os
 P. Cord            Marginal insertion (fundal)
 Insertion:

 Amniotic Fluid
 AFI FV:      Polyhydramnios
                                             Larg Pckt:     9.6  cm
Biometry

 BPD:     53.7  mm     G. Age:  22w 2d                CI:         80.9   70 - 86
 OFD:     66.4  mm                                    FL/HC:      18.4   15.9 -

 HC:     192.5  mm     G. Age:  21w 4d       62  %    HC/AC:      1.20   1.06 -

 AC:     160.5  mm     G. Age:  21w 1d       48  %    FL/BPD:
 FL:      35.5  mm     G. Age:  21w 1d       49  %    FL/AC:      22.1   20 - 24
 HUM:       35  mm     G. Age:  22w 0d       73  %
 CER:       23  mm     G. Age:  21w 4d       65  %

 Est. FW:     411  gm    0 lb 14 oz      50  %
Gestational Age

 LMP:           21w 0d        Date:  12/09/13                 EDD:   09/15/14
 U/S Today:     21w 4d                                        EDD:   09/11/14
 Best:          21w 0d     Det. By:  LMP  (12/09/13)          EDD:   09/15/14
Anatomy

 Cranium:          Appears normal         Aortic Arch:      Appears normal
 Fetal Cavum:      Appears normal         Ductal Arch:      Appears normal
 Ventricles:       Appears normal         Diaphragm:        Appears normal
 Choroid Plexus:   Appears normal         Stomach:          Appears normal, left
                                                            sided
 Cerebellum:       Appears normal         Abdomen:          Appears normal
 Posterior Fossa:  Appears normal         Abdominal Wall:   Appears nml (cord
                                                            insert, abd wall)
 Nuchal Fold:      Not applicable (>20    Cord Vessels:     Appears normal (3
                   wks GA)                                  vessel cord)
 Face:             Appears normal         Kidneys:          Appear normal
                   (orbits and profile)
 Lips:             Appears normal         Bladder:          Appears normal
 Heart:            Appears normal         Spine:            Appears normal
                   (4CH, axis, and
                   situs)
 RVOT:             Appears normal         Lower             Appears normal
                                          Extremities:
 LVOT:             Appears normal         Upper             Appears normal
                                          Extremities:

 Other:  Fetus appears to be a male. Heels and 5th digit visualized. Bilateral
         open hands seen. Nasal bone visualized.
Targeted Anatomy

 Fetal Central Nervous System
 Cisterna Magna:
Cervix Uterus Adnexa

 Cervical Length:    3        cm

 Cervix:       Normal appearance by transabdominal scan.

 Left Ovary:    Within normal limits.
 Right Ovary:   Within normal limits.
 Adnexa:     No abnormality visualized.
Comments

 Ms. Adriaan was noted to have mild polyhyramnios with a
 maxium vertical pocket of 9.6 cm.  The fetal anatomic survey
 was within normal limits.  A normal stomach bubble was
 noted and there are no signs suggestive of fetal GI
 obstruction.  We briefly reviewed the differential diagnosis of
 polyhydramnios.  At this time, there are no ultrasound
 findings that would explain the etiology of polyhydramnios
Impression

 Single IUP at 21w 0d
 Normal fetal anatomic survey.
 A normal stomach bubble was noted.
 Anterior placenta without previa
 Mild polyhydramnios is noted (Maximum vertical pocket
 cm)
Recommendations

 Recommend early GDM screening given finding of
 polyhydramnios
 Recommend follow-up ultrasound examination in 4 weeks for
 growth and to reevaluate.
 If polyhydramnios is persisent, would recommend antenatal
 fetal testing - weekly BPPs beginning at 28 weeks and
 transitioning to 2x weekly NSTs with weekly AFIs at 32 weeks.

 Thank you for sharing in the care of Ms. NUSRAT GAMBLE

## 2016-08-10 ENCOUNTER — Emergency Department (HOSPITAL_COMMUNITY)
Admission: EM | Admit: 2016-08-10 | Discharge: 2016-08-10 | Disposition: A | Discharge status: Home / Self Care | Payer: Self-pay | Attending: Physician Assistant | Admitting: Physician Assistant

## 2016-08-10 ENCOUNTER — Encounter (HOSPITAL_COMMUNITY): Payer: Self-pay | Admitting: Emergency Medicine

## 2016-08-10 DIAGNOSIS — R21 Rash and other nonspecific skin eruption: Secondary | ICD-10-CM | POA: Insufficient documentation

## 2016-08-10 DIAGNOSIS — Z79899 Other long term (current) drug therapy: Secondary | ICD-10-CM | POA: Insufficient documentation

## 2016-08-10 DIAGNOSIS — F17210 Nicotine dependence, cigarettes, uncomplicated: Secondary | ICD-10-CM | POA: Insufficient documentation

## 2016-08-10 MED ORDER — HYDROXYZINE HCL 10 MG PO TABS: 10.0000 mg | ORAL_TABLET | Freq: Four times a day (QID) | ORAL | 0 refills | Status: DC | PRN

## 2016-08-10 MED ORDER — PREDNISONE 20 MG PO TABS: 40.0000 mg | ORAL_TABLET | Freq: Every day | ORAL | 0 refills | Status: DC

## 2016-08-10 NOTE — Discharge Instructions (Signed)
Take your medications as prescribed as needed for itching. You may also continue applying calamine lotion to your rash to help with itching. You rash may last for 8-12 weeks. Natural sunlight may help speed up resolution.  Please follow up with a primary care provider from the Resource Guide provided below in one to 2 weeks if your rash has not improved or has worsened. Please return to the Emergency Department if symptoms worsen or new onset of fever, oral lesion, new/worsening rash, fluid filled lesions, drainage, chest pain, shortness of breath, abdominal pain, vomiting, rash on palms or soles.

## 2016-08-10 NOTE — ED Provider Notes (Signed)
[facility]-EMERGENCY DEPT Provider Note   By signing my name below, I, Earmon Phoenix, attest that this documentation has been prepared under the direction and in the presence of Melburn Hake, PA-C. Electronically Signed: Earmon Phoenix, ED Scribe. 08/10/16. 11:34 AM.    History   Chief Complaint Chief Complaint  Patient presents with  . Rash    The history is provided by the patient and medical records. No language interpreter was used.    Morgan Galloway is a 26 y.o. female who presents to the Emergency Department complaining of a rash to the posterior neck, back and torso that appeared two days ago. She reports associated itching. She has applied A & D ointment and calamine lotion for symptoms that gave temporary relief. She denies modifying factors. She denies any new soaps, lotions, creams, detergents or any other personal hygiene products, new foods or new medications. She denies contact with anyone with similar symptoms and states she believes she got the rash from her gym. She denies drainage from the rash, mouth lesions, fever, chills, HA, neck stiffness, CP, SOB, abdominal, nausea, vomiting.   Past Medical History:  Diagnosis Date  . Depression    Zoloft in past; stopped due to potential of pregnancy.  . Gonorrhea   . Headache   . History of stillbirth    June 2012  . Infection    UTI  . Vaginal Pap smear, abnormal    f/u ok    Patient Active Problem List   Diagnosis Date Noted  . 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 SVD (spontaneous vaginal delivery) 08/25/2014  . IUGR (intrauterine growth restriction) 08/24/2014  . Hx of intrauterine fetal death, currently pregnant     Past Surgical History:  Procedure Laterality Date  . NO PAST SURGERIES      OB History    Gravida Para Term Preterm AB Living   SAB TAB Ectopic Multiple Live Births   2     0 2       Home Medications    Prior to Admission medications     Medication Sig Start Date End Date Taking? Authorizing Provider  hydrOXYzine (ATARAX/VISTARIL) 10 MG tablet Take 1 tablet (10 mg total) by mouth every 6 (six) hours as needed for itching. 08/10/16   Barrett Henle, PA-C  predniSONE (DELTASONE) 20 MG tablet Take 2 tablets (40 mg total) by mouth daily. 08/10/16   Barrett Henle, PA-C    Family History Family History  Problem Relation Age of Onset  . Cancer Maternal Grandmother   . Diabetes Maternal Grandmother   . Stroke Father 64  . Hypertension Father   . Bipolar disorder Mother   . Bipolar disorder Sister   . Other Neg Hx     Social History Social History  Substance Use Topics  . Smoking status: Current Every Day Smoker    Packs/day: 0.50    Types: Cigarettes    Last attempt to quit: 12/20/2013  . Smokeless tobacco: Never Used     Comment: quit with preg  . Alcohol use Yes     Comment: occasionally     Allergies   Patient has no known allergies.   Review of Systems Review of Systems  Constitutional: Negative for chills and fever.  HENT: Negative for mouth sores.   Respiratory: Negative for shortness of breath.   Cardiovascular: Negative for chest pain.  Gastrointestinal: Negative for abdominal pain, nausea  and vomiting.  Musculoskeletal: Negative for neck stiffness.  Skin: Positive for rash.     Physical Exam Updated Vital Signs BP 136/84 (BP Location: Right Arm)   Pulse 79   Temp 98.4 F (36.9 C) (Oral)   Resp 16   LMP 04/10/2016   SpO2 100%   Breastfeeding? Unknown   Physical Exam  Constitutional: She is oriented to person, place, and time. She appears well-developed and well-nourished.  HENT:  Head: Normocephalic and atraumatic.  Mouth/Throat: Uvula is midline, oropharynx is clear and moist and mucous membranes are normal. No oral lesions. No oropharyngeal exudate, posterior oropharyngeal edema, posterior oropharyngeal erythema or tonsillar abscesses. No tonsillar exudate.  Eyes:  Conjunctivae and EOM are normal. Right eye exhibits no discharge. Left eye exhibits no discharge. No scleral icterus.  Neck: Normal range of motion. Neck supple.  Cardiovascular: Normal rate, regular rhythm, normal heart sounds and intact distal pulses.  Exam reveals no gallop and no friction rub.   No murmur heard. Pulmonary/Chest: Effort normal and breath sounds normal. No respiratory distress. She has no wheezes. She has no rales.  Abdominal: Soft. Bowel sounds are normal. She exhibits no distension and no mass. There is no tenderness. There is no rebound and no guarding. No hernia.  Musculoskeletal: Normal range of motion. She exhibits no edema or tenderness.  Lymphadenopathy:    She has no cervical adenopathy.  Neurological: She is alert and oriented to person, place, and time.  Skin: Skin is warm and dry. Rash noted.  Herald patch located to left upper chest wall with scattered erythematous maculopapular rash present to trunk. No vesicles, pustules or bulla present. No lesions on palms or soles.  Nursing note and vitals reviewed.    ED Treatments / Results  DIAGNOSTIC STUDIES: Oxygen Saturation is 100% on RA, normal by my interpretation.   COORDINATION OF CARE: 11:29 AM- Will treat symptomatically for viral rash. Return precautions discussed. Pt verbalizes understanding and agrees to plan.  Medications - No data to display   Labs (all labs ordered are listed, but only abnormal results are displayed) Labs Reviewed - No data to display  EKG  EKG Interpretation None       Radiology No results found.  Procedures Procedures (including critical care time)  Medications Ordered in ED Medications - No data to display   Initial Impression / Assessment and Plan / ED Course  I have reviewed the triage vital signs and the nursing notes.  Pertinent labs & imaging results that were available during my care of the patient were reviewed by me and considered in my medical  decision making (see chart for details).     Rash consistent with pityriasis rosea. Patient denies any difficulty breathing or swallowing.  Pt has a patent airway without stridor and is handling secretions without difficulty; no angioedema. No blisters, no pustules, no warmth, no draining sinus tracts, no superficial abscesses, no bullous impetigo, no vesicles, no desquamation, no target lesions with dusky purpura or a central bulla. Not tender to touch. No concern for superimposed infection. No concern for SJS, TEN, TSS, tick borne illness, syphilis or other life-threatening condition. Will discharge home with short course of steroids, vistaril and recommend continued use of Calamine lotion as needed for pruritis. Discussed return precautions.   Final Clinical Impressions(s) / ED Diagnoses   Final diagnoses:  Rash    New Prescriptions New Prescriptions   HYDROXYZINE (ATARAX/VISTARIL) 10 MG TABLET    Take 1 tablet (10 mg total) by mouth  every 6 (six) hours as needed for itching.   PREDNISONE (DELTASONE) 20 MG TABLET    Take 2 tablets (40 mg total) by mouth daily.    I personally performed the services described in this documentation, which was scribed in my presence. The recorded information has been reviewed and is accurate.      Satira Sark [place], [place] 08/10/16 1139    Courteney Randall An, MD 08/10/16 1446

## 2016-08-10 NOTE — ED Triage Notes (Signed)
Pt sts generalized itchy rash

## 2016-09-13 ENCOUNTER — Inpatient Hospital Stay (HOSPITAL_COMMUNITY)
Admission: AD | Admit: 2016-09-13 | Discharge: 2016-09-13 | Disposition: A | Discharge status: Home / Self Care | Payer: Self-pay | Source: Ambulatory Visit | Attending: Family Medicine | Admitting: Family Medicine

## 2016-09-13 ENCOUNTER — Encounter (HOSPITAL_COMMUNITY): Payer: Self-pay | Admitting: *Deleted

## 2016-09-13 DIAGNOSIS — N926 Irregular menstruation, unspecified: Secondary | ICD-10-CM

## 2016-09-13 DIAGNOSIS — Z3200 Encounter for pregnancy test, result unknown: Secondary | ICD-10-CM | POA: Insufficient documentation

## 2016-09-13 NOTE — MAU Note (Signed)
Patient had a +HPT yesterday; LMP 4/18--but unsure.  Denies vaginal bleeding or discharge. Denies pain at this time. Seeking "advice" and expresses inquiry about why she is having repeated miscarriages.

## 2016-09-13 NOTE — MAU Provider Note (Signed)
  S:   26 y.o. [id number] @ unknown presents to MAU for pregnancy confirmation.  She denies abdominal pain or vaginal bleeding today.    O: BP 99/81 (BP Location: Right Arm)   Pulse 60   Temp 97.6 F (36.4 C) (Oral)   Resp 16   Wt 122 lb 0.6 oz (55.4 kg)   LMP 04/10/2016   SpO2 100%   BMI 20.95 kg/m  Physical Examination: General appearance - alert, well appearing, and in no distress, oriented to person, place, and time and acyanotic, in no respiratory distress  No results found for this or any previous visit (from the past 48 hour(s)).  A: Missed menses  P: D/C home Informed pt we do not do pregnancy verifications in MAU [facility] & verification Return to MAU as needed for pregnancy related emergencies   Shante [place], Harolyn RutherfordJennifer I, NP 09/13/2016 11:49 AM

## 2016-09-22 ENCOUNTER — Inpatient Hospital Stay (HOSPITAL_COMMUNITY)
Admission: AD | Admit: 2016-09-22 | Discharge: 2016-09-22 | Disposition: A | Discharge status: Home / Self Care | Payer: Medicaid Other | Source: Ambulatory Visit | Attending: Obstetrics and Gynecology | Admitting: Obstetrics and Gynecology

## 2016-09-22 ENCOUNTER — Encounter (HOSPITAL_COMMUNITY): Payer: Self-pay | Admitting: *Deleted

## 2016-09-22 ENCOUNTER — Inpatient Hospital Stay (HOSPITAL_COMMUNITY): Payer: Medicaid Other

## 2016-09-22 DIAGNOSIS — Z8619 Personal history of other infectious and parasitic diseases: Secondary | ICD-10-CM | POA: Insufficient documentation

## 2016-09-22 DIAGNOSIS — F17210 Nicotine dependence, cigarettes, uncomplicated: Secondary | ICD-10-CM | POA: Diagnosis not present

## 2016-09-22 DIAGNOSIS — R109 Unspecified abdominal pain: Secondary | ICD-10-CM | POA: Diagnosis present

## 2016-09-22 DIAGNOSIS — O99332 Smoking (tobacco) complicating pregnancy, second trimester: Secondary | ICD-10-CM | POA: Diagnosis not present

## 2016-09-22 DIAGNOSIS — Z8759 Personal history of other complications of pregnancy, childbirth and the puerperium: Secondary | ICD-10-CM | POA: Insufficient documentation

## 2016-09-22 DIAGNOSIS — O26891 Other specified pregnancy related conditions, first trimester: Secondary | ICD-10-CM | POA: Diagnosis not present

## 2016-09-22 DIAGNOSIS — O9989 Other specified diseases and conditions complicating pregnancy, childbirth and the puerperium: Secondary | ICD-10-CM | POA: Diagnosis not present

## 2016-09-22 DIAGNOSIS — O26899 Other specified pregnancy related conditions, unspecified trimester: Secondary | ICD-10-CM

## 2016-09-22 DIAGNOSIS — Z87440 Personal history of urinary (tract) infections: Secondary | ICD-10-CM | POA: Insufficient documentation

## 2016-09-22 DIAGNOSIS — Z3A01 Less than 8 weeks gestation of pregnancy: Secondary | ICD-10-CM | POA: Diagnosis not present

## 2016-09-22 DIAGNOSIS — M545 Low back pain: Secondary | ICD-10-CM | POA: Diagnosis present

## 2016-09-22 DIAGNOSIS — O23591 Infection of other part of genital tract in pregnancy, first trimester: Secondary | ICD-10-CM | POA: Diagnosis not present

## 2016-09-22 DIAGNOSIS — N760 Acute vaginitis: Secondary | ICD-10-CM | POA: Diagnosis not present

## 2016-09-22 DIAGNOSIS — Z3201 Encounter for pregnancy test, result positive: Secondary | ICD-10-CM | POA: Insufficient documentation

## 2016-09-22 DIAGNOSIS — B9689 Other specified bacterial agents as the cause of diseases classified elsewhere: Secondary | ICD-10-CM | POA: Diagnosis not present

## 2016-09-22 LAB — URINALYSIS, ROUTINE W REFLEX MICROSCOPIC
Bilirubin Urine: NEGATIVE
Glucose, UA: NEGATIVE mg/dL
HGB URINE DIPSTICK: NEGATIVE
Ketones, ur: NEGATIVE mg/dL
LEUKOCYTES UA: NEGATIVE
Nitrite: NEGATIVE
PH: 6 (ref 5.0–8.0)
Protein, ur: NEGATIVE mg/dL
Specific Gravity, Urine: 1.02 (ref 1.005–1.030)

## 2016-09-22 LAB — HCG, QUANTITATIVE, PREGNANCY: hCG, Beta Chain, Quant, S: 50958 m[IU]/mL — ABNORMAL HIGH (ref <5)

## 2016-09-22 LAB — ABO/RH: ABO/RH(D): O POS

## 2016-09-22 LAB — CBC
HCT: 37.4 % (ref 36.0–46.0)
Hemoglobin: 13.1 g/dL (ref 12.0–15.0)
MCH: 31.3 pg (ref 26.0–34.0)
MCHC: 35 g/dL (ref 30.0–36.0)
MCV: 89.3 fL (ref 78.0–100.0)
PLATELETS: 173 10*3/uL (ref 150–400)
RBC: 4.19 MIL/uL (ref 3.87–5.11)
RDW: 12.4 % (ref 11.5–15.5)
WBC: 8.8 10*3/uL (ref 4.0–10.5)

## 2016-09-22 LAB — WET PREP, GENITAL
Sperm: NONE SEEN
Trich, Wet Prep: NONE SEEN
YEAST WET PREP: NONE SEEN

## 2016-09-22 LAB — POCT PREGNANCY, URINE: Preg Test, Ur: POSITIVE — AB

## 2016-09-22 MED ORDER — METRONIDAZOLE 500 MG PO TABS: 500.0000 mg | ORAL_TABLET | Freq: Two times a day (BID) | ORAL | 0 refills | Status: AC

## 2016-09-22 NOTE — MAU Provider Note (Signed)
Chief Complaint: Abdominal Pain and Possible Pregnancy   First Provider Initiated Contact with Patient 09/22/16 1537        SUBJECTIVE HPI: Morgan Galloway is a 26 y.o. [id number] at [id number] by LMP who presents to maternity admissions reporting lower abdominal cramping and some low back pain, since last night.. She denies vaginal bleeding, vaginal itching/burning, urinary symptoms, h/a, dizziness, n/v, or fever/chills.    Abdominal Pain  This is a new problem. The current episode started yesterday. The onset quality is gradual. The problem occurs intermittently. The problem has been unchanged. The pain is located in the suprapubic region, LLQ and RLQ. The quality of the pain is cramping. The abdominal pain radiates to the back. Pertinent negatives include no constipation, diarrhea, dysuria, fever, headaches, myalgias, nausea or vomiting. Nothing aggravates the pain. The pain is relieved by nothing. She has tried nothing for the symptoms.  Possible Pregnancy  Associated symptoms include abdominal pain. Pertinent negatives include no fever, headaches, myalgias, nausea or vomiting.    RN Note: Having some cramping and lower back pain, kind of like it would is she was going to miscarry.started last night. Has seen no bleeding   Past Medical History:  Diagnosis Date  . Depression    Zoloft in past; stopped due to potential of pregnancy.  . Gonorrhea   . Headache   . History of stillbirth    June 2012  . Infection    UTI  . Vaginal Pap smear, abnormal    f/u ok   Past Surgical History:  Procedure Laterality Date  . NO PAST SURGERIES     Social History   Social History  . Marital status: Married    Spouse name: N/A  . Number of children: N/A  . Years of education: N/A   Occupational History  . Not on file.   Social History Main Topics  . Smoking status: Current Every Day Smoker    Packs/day: 0.50    Types: Cigarettes    Last attempt to quit: 12/20/2013  . Smokeless tobacco: Never  Used     Comment: quit with preg  . Alcohol use Yes     Comment: occasionally  . Drug use: No  . Sexual activity: Yes   Other Topics Concern  . Not on file   Social History Narrative   Marital status: married since 2012; happily; no abuse.      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      Education: in school for RN; one more year to go.      Tobacco: none (rare smoking)      Alcohol:  One or two beers during the week.  No DWIs.        Drugs:  None      Exercise:  None      Seatbelt:  100%         No current facility-administered medications on file prior to encounter.    Current Outpatient Prescriptions on File Prior to Encounter  Medication Sig Dispense Refill  . hydrOXYzine (ATARAX/VISTARIL) 10 MG tablet Take 1 tablet (10 mg total) by mouth every 6 (six) hours as needed for itching. (Patient not taking: Reported on 09/22/2016) 30 tablet 0  . predniSONE (DELTASONE) 20 MG tablet Take 2 tablets (40 mg total) by mouth daily. (Patient not taking: Reported on 09/22/2016) 10  tablet 0   No Known Allergies  I have reviewed patient's Past Medical Hx, Surgical Hx, Family Hx, Social Hx, medications and allergies.   ROS:  Review of Systems  Constitutional: Negative for fever.  Gastrointestinal: Positive for abdominal pain. Negative for constipation, diarrhea, nausea and vomiting.  Genitourinary: Negative for dysuria.  Musculoskeletal: Negative for myalgias.  Neurological: Negative for headaches.   Review of Systems  Other systems negative   Physical Exam  Physical Exam Patient Vitals for the past 24 hrs:  BP Temp Temp src Pulse Resp SpO2 Weight  09/22/16 1242 116/65 98.4 F (36.9 C) Oral 60 16 100 % 126 lb 4 oz (57.3 kg)   Constitutional: Well-developed, well-nourished female in no acute distress.  Cardiovascular: normal rate Respiratory: normal effort GI: Abd soft, mostly  nontender. Pos BS x 4 MS: Extremities nontender, no edema, normal ROM Neurologic: Alert and oriented x 4.  GU: Neg CVAT.  PELVIC EXAM: Cervix pink, visually closed, without lesion, scant white creamy discharge, vaginal walls and external genitalia normal Bimanual exam: Cervix 0/long/high, firm, anterior, neg CMT, uterus slightly tender, enlarged to 6 wk size, adnexa with mild  Tenderness on left,   But no enlargement, or mass  LAB RESULTS Results for orders placed or performed during the hospital encounter of 09/22/16 (from the past 24 hour(s))  Urinalysis, Routine w reflex microscopic     Status: None   Collection Time: 09/22/16 12:45 PM  Result Value Ref Range   Color, Urine YELLOW YELLOW   APPearance CLEAR CLEAR   Specific Gravity, Urine 1.020 1.005 - 1.030   pH 6.0 5.0 - 8.0   Glucose, UA NEGATIVE NEGATIVE mg/dL   Hgb urine dipstick NEGATIVE NEGATIVE   Bilirubin Urine NEGATIVE NEGATIVE   Ketones, ur NEGATIVE NEGATIVE mg/dL   Protein, ur NEGATIVE NEGATIVE mg/dL   Nitrite NEGATIVE NEGATIVE   Leukocytes, UA NEGATIVE NEGATIVE  Pregnancy, urine POC     Status: Abnormal   Collection Time: 09/22/16 12:57 PM  Result Value Ref Range   Preg Test, Ur POSITIVE (A) NEGATIVE  ABO/Rh     Status: None   Collection Time: 09/22/16  1:40 PM  Result Value Ref Range   ABO/RH(D) O POS   hCG, quantitative, pregnancy     Status: Abnormal   Collection Time: 09/22/16  1:40 PM  Result Value Ref Range   hCG, Beta Chain, Quant, S 50,958 (H) <5 mIU/mL  CBC     Status: None   Collection Time: 09/22/16  1:40 PM  Result Value Ref Range   WBC 8.8 4.0 - 10.5 K/uL   RBC 4.19 3.87 - 5.11 MIL/uL   Hemoglobin 13.1 12.0 - 15.0 g/dL   HCT 40.9 81.1 - 91.4 %   MCV 89.3 78.0 - 100.0 fL   MCH 31.3 26.0 - 34.0 pg   MCHC 35.0 30.0 - 36.0 g/dL   RDW 78.2 95.6 - 21.3 %   Platelets 173 150 - 400 K/uL   + clue cells  --/--/O POS (06/01 1340)  IMAGING US Ob Comp Less 14 Wks  Result Date: 09/22/2016 CLINICAL  DATA:  Pregnant, cramping/pain EXAM: OBSTETRIC <14 WK [place] AND TRANSVAGINAL OB US TECHNIQUE: Both transabdominal and transvaginal ultrasound examinations were performed for complete evaluation of the gestation as well as the maternal uterus, adnexal regions, and pelvic cul-de-sac. Transvaginal technique was performed to assess early pregnancy. COMPARISON:  None. FINDINGS: Intrauterine gestational sac: Single Yolk sac:  Visualized. Embryo:  Visualized. Cardiac Activity: Visualized. Heart Rate: 117  bpm CRL:  5.5  mm   6 w   1 d                  [place] EDC: 05/17/2017 Subchorionic hemorrhage:  None visualized. Maternal uterus/adnexae: Bilateral ovaries are within normal limits. Trace pelvic fluid. IMPRESSION: Single live intrauterine gestation, with estimated gestational age [redacted] weeks 1 day by crown-rump length. Electronically Signed   By: Charline Bills M.D.   On: 09/22/2016 16:32   US Ob Transvaginal  Result Date: 09/22/2016 CLINICAL DATA:  Pregnant, cramping/pain EXAM: OBSTETRIC <14 WK [place] AND TRANSVAGINAL OB US TECHNIQUE: Both transabdominal and transvaginal ultrasound examinations were performed for complete evaluation of the gestation as well as the maternal uterus, adnexal regions, and pelvic cul-de-sac. Transvaginal technique was performed to assess early pregnancy. COMPARISON:  None. FINDINGS: Intrauterine gestational sac: Single Yolk sac:  Visualized. Embryo:  Visualized. Cardiac Activity: Visualized. Heart Rate: 117  bpm CRL:  5.5  mm   6 w   1 d                  [place] EDC: 05/17/2017 Subchorionic hemorrhage:  None visualized. Maternal uterus/adnexae: Bilateral ovaries are within normal limits. Trace pelvic fluid. IMPRESSION: Single live intrauterine gestation, with estimated gestational age [redacted] weeks 1 day by crown-rump length. Electronically Signed   By: Charline Bills M.D.   On: 09/22/2016 16:32    MAU Management/MDM: Ordered usual first trimester r/o ectopic labs.   Pelvic exam and cultures done Will  check baseline Ultrasound to rule out ectopic.  This bleeding/pain can represent a normal pregnancy with bleeding, spontaneous abortion or even an ectopic which can be life-threatening.  The process as listed above helps to determine which of these is present.  Single IUP seen therefore ruling out ectopic  ASSESSMENT 1. Cramping affecting pregnancy, antepartum   2. Cramping affecting pregnancy, antepartum   3.   Bacterial vaginosis  PLAN Discharge home Rx Flagyl for BV Encouraged to seek prenatal care  Pt stable at time of discharge. Encouraged to return here or to other Urgent Care/ED if she develops worsening of symptoms, increase in pain, fever, or other concerning symptoms.    Wynelle Bourgeois CNM, MSN Certified Nurse-Midwife 09/22/2016  4:08 PM

## 2016-09-22 NOTE — MAU Note (Signed)
Having some cramping and lower back pain, kind of like it would is she was going to miscarry.started last night. Has seen no bleeding

## 2016-09-22 NOTE — Discharge Instructions (Signed)
Bacterial Vaginosis °Bacterial vaginosis is a vaginal infection that occurs when the normal balance of bacteria in the vagina is disrupted. It results from an overgrowth of certain bacteria. This is the most common vaginal infection among women ages 15-44. °Because bacterial vaginosis increases your risk for STIs (sexually transmitted infections), getting treated can help reduce your risk for chlamydia, gonorrhea, herpes, and HIV (human immunodeficiency virus). Treatment is also important for preventing complications in pregnant women, because this condition can cause an early (premature) delivery. °What are the causes? °This condition is caused by an increase in harmful bacteria that are normally present in small amounts in the vagina. However, the reason that the condition develops is not fully understood. °What increases the risk? °The following factors may make you more likely to develop this condition: °· Having a new sexual partner or multiple sexual partners. °· Having unprotected sex. °· Douching. °· Having an intrauterine device (IUD). °· Smoking. °· Drug and alcohol abuse. °· Taking certain antibiotic medicines. °· Being pregnant. °You cannot get bacterial vaginosis from toilet seats, bedding, swimming pools, or contact with objects around you. °What are the signs or symptoms? °Symptoms of this condition include: °· Grey or white vaginal discharge. The discharge can also be watery or foamy. °· A fish-like odor with discharge, especially after sexual intercourse or during menstruation. °· Itching in and around the vagina. °· Burning or pain with urination. °Some women with bacterial vaginosis have no signs or symptoms. °How is this diagnosed? °This condition is diagnosed based on: °· Your medical history. °· A physical exam of the vagina. °· Testing a sample of vaginal fluid under a microscope to look for a large amount of bad bacteria or abnormal cells. Your health care provider may use a cotton swab or a  small wooden spatula to collect the sample. °How is this treated? °This condition is treated with antibiotics. These may be given as a pill, a vaginal cream, or a medicine that is put into the vagina (suppository). If the condition comes back after treatment, a second round of antibiotics may be needed. °Follow these instructions at home: °Medicines  °· Take over-the-counter and prescription medicines only as told by your health care provider. °· Take or use your antibiotic as told by your health care provider. Do not stop taking or using the antibiotic even if you start to feel better. °General instructions  °· If you have a female sexual partner, tell her that you have a vaginal infection. She should see her health care provider and be treated if she has symptoms. If you have a female sexual partner, he does not need treatment. °· During treatment: °¨ Avoid sexual activity until you finish treatment. °¨ Do not douche. °¨ Avoid alcohol as directed by your health care provider. °¨ Avoid breastfeeding as directed by your health care provider. °· Drink enough water and fluids to keep your urine clear or pale yellow. °· Keep the area around your vagina and rectum clean. °¨ Wash the area daily with warm water. °¨ Wipe yourself from front to back after using the toilet. °· Keep all follow-up visits as told by your health care provider. This is important. °How is this prevented? °· Do not douche. °· Wash the outside of your vagina with warm water only. °· Use protection when having sex. This includes latex condoms and dental dams. °· Limit how many sexual partners you have. To help prevent bacterial vaginosis, it is best to have sex with just one   partner (monogamous). °· Make sure you and your sexual partner are tested for STIs. °· Wear cotton or cotton-lined underwear. °· Avoid wearing tight pants and pantyhose, especially during summer. °· Limit the amount of alcohol that you drink. °· Do not use any products that contain  nicotine or tobacco, such as cigarettes and e-cigarettes. If you need help quitting, ask your health care provider. °· Do not use illegal drugs. °Where to find more information: °· Centers for Disease Control and Prevention: www.cdc.gov/std °· American Sexual Health Association (ASHA): www.ashastd.org °· [place] Department of Health and Human Services, Office on Women's Health: www.womenshealth.gov/ or https://www.womenshealth.gov/a-z-topics/bacterial-vaginosis °Contact a health care provider if: °· Your symptoms do not improve, even after treatment. °· You have more discharge or pain when urinating. °· You have a fever. °· You have pain in your abdomen. °· You have pain during sex. °· You have vaginal bleeding between periods. °Summary °· Bacterial vaginosis is a vaginal infection that occurs when the normal balance of bacteria in the vagina is disrupted. °· Because bacterial vaginosis increases your risk for STIs (sexually transmitted infections), getting treated can help reduce your risk for chlamydia, gonorrhea, herpes, and HIV (human immunodeficiency virus). Treatment is also important for preventing complications in pregnant women, because the condition can cause an early (premature) delivery. °· This condition is treated with antibiotic medicines. These may be given as a pill, a vaginal cream, or a medicine that is put into the vagina (suppository). °This information is not intended to replace advice given to you by your health care provider. Make sure you discuss any questions you have with your health care provider. °Document Released: 04/10/2005 Document Revised: 12/25/2015 Document Reviewed: 12/25/2015 °Elsevier Interactive Patient Education © 2017 [employer] ° °First Trimester of Pregnancy °The first trimester of pregnancy is from week 1 until the end of week 13 (months 1 through 3). A week after a sperm fertilizes an egg, the egg will implant on the wall of the uterus. This embryo will begin to develop  into a baby. Genes from you and your partner will form the baby. The female genes will determine whether the baby will be a boy or a girl. At 6-8 weeks, the eyes and face will be formed, and the heartbeat can be seen on ultrasound. At the end of 12 weeks, all the baby's organs will be formed. °Now that you are pregnant, you will want to do everything you can to have a healthy baby. Two of the most important things are to get good prenatal care and to follow your health care provider's instructions. Prenatal care is all the medical care you receive before the baby's birth. This care will help prevent, find, and treat any problems during the pregnancy and childbirth. °Body changes during your first trimester °Your body goes through many changes during pregnancy. The changes vary from woman to woman. °· You may gain or lose a couple of pounds at first. °· You may feel sick to your stomach (nauseous) and you may throw up (vomit). If the vomiting is uncontrollable, call your health care provider. °· You may tire easily. °· You may develop headaches that can be relieved by medicines. All medicines should be approved by your health care provider. °· You may urinate more often. Painful urination may mean you have a bladder infection. °· You may develop heartburn as a result of your pregnancy. °· You may develop constipation because certain hormones are causing the muscles that push stool through your intestines   to slow down. °· You may develop hemorrhoids or swollen veins (varicose veins). °· Your breasts may begin to grow larger and become tender. Your nipples may stick out more, and the tissue that surrounds them (areola) may become darker. °· Your gums may bleed and may be sensitive to brushing and flossing. °· Dark spots or blotches (chloasma, mask of pregnancy) may develop on your face. This will likely fade after the baby is born. °· Your menstrual periods will stop. °· You may have a loss of appetite. °· You may  develop cravings for certain kinds of food. °· You may have changes in your emotions from day to day, such as being excited to be pregnant or being concerned that something may go wrong with the pregnancy and baby. °· You may have more vivid and strange dreams. °· You may have changes in your hair. These can include thickening of your hair, rapid growth, and changes in texture. Some women also have hair loss during or after pregnancy, or hair that feels dry or thin. Your hair will most likely return to normal after your baby is born. °What to expect at prenatal visits °During a routine prenatal visit: °· You will be weighed to make sure you and the baby are growing normally. °· Your blood pressure will be taken. °· Your abdomen will be measured to track your baby's growth. °· The fetal heartbeat will be listened to between weeks 10 and 14 of your pregnancy. °· Test results from any previous visits will be discussed. °Your health care provider may ask you: °· How you are feeling. °· If you are feeling the baby move. °· If you have had any abnormal symptoms, such as leaking fluid, bleeding, severe headaches, or abdominal cramping. °· If you are using any tobacco products, including cigarettes, chewing tobacco, and electronic cigarettes. °· If you have any questions. °Other tests that may be performed during your first trimester include: °· Blood tests to find your blood type and to check for the presence of any previous infections. The tests will also be used to check for low iron levels (anemia) and protein on red blood cells (Rh antibodies). Depending on your risk factors, or if you previously had diabetes during pregnancy, you may have tests to check for high blood sugar that affects pregnant women (gestational diabetes). °· Urine tests to check for infections, diabetes, or protein in the urine. °· An ultrasound to confirm the proper growth and development of the baby. °· Fetal screens for spinal cord problems  (spina bifida) and Down syndrome. °· HIV (human immunodeficiency virus) testing. Routine prenatal testing includes screening for HIV, unless you choose not to have this test. °· You may need other tests to make sure you and the baby are doing well. °Follow these instructions at home: °Medicines  °· Follow your health care provider's instructions regarding medicine use. Specific medicines may be either safe or unsafe to take during pregnancy. °· Take a prenatal vitamin that contains at least 600 micrograms (mcg) of folic acid. °· If you develop constipation, try taking a stool softener if your health care provider approves. °Eating and drinking  °· Eat a balanced diet that includes fresh fruits and vegetables, whole grains, good sources of protein such as meat, eggs, or tofu, and low-fat dairy. Your health care provider will help you determine the amount of weight gain that is right for you. °· Avoid raw meat and uncooked cheese. These carry germs that can cause birth defects   in the baby. °· Eating four or five small meals rather than three large meals a day may help relieve nausea and vomiting. If you start to feel nauseous, eating a few soda crackers can be helpful. Drinking liquids between meals, instead of during meals, also seems to help ease nausea and vomiting. °· Limit foods that are high in fat and processed sugars, such as fried and sweet foods. °· To prevent constipation: °¨ Eat foods that are high in fiber, such as fresh fruits and vegetables, whole grains, and beans. °¨ Drink enough fluid to keep your urine clear or pale yellow. °Activity  °· Exercise only as directed by your health care provider. Most women can continue their usual exercise routine during pregnancy. Try to exercise for 30 minutes at least 5 days a week. Exercising will help you: °¨ Control your weight. °¨ Stay in shape. °¨ Be prepared for labor and delivery. °· Experiencing pain or cramping in the lower abdomen or lower back is a good  sign that you should stop exercising. Check with your health care provider before continuing with normal exercises. °· Try to avoid standing for long periods of time. Move your legs often if you must stand in one place for a long time. °· Avoid heavy lifting. °· Wear low-heeled shoes and practice good posture. °· You may continue to have sex unless your health care provider tells you not to. °Relieving pain and discomfort  °· Wear a good support bra to relieve breast tenderness. °· Take warm sitz baths to soothe any pain or discomfort caused by hemorrhoids. Use hemorrhoid cream if your health care provider approves. °· Rest with your legs elevated if you have leg cramps or low back pain. °· If you develop varicose veins in your legs, wear support hose. Elevate your feet for 15 minutes, 3-4 times a day. Limit salt in your diet. °Prenatal care  °· Schedule your prenatal visits by the twelfth week of pregnancy. They are usually scheduled monthly at first, then more often in the last 2 months before delivery. °· Write down your questions. Take them to your prenatal visits. °· Keep all your prenatal visits as told by your health care provider. This is important. °Safety  °· Wear your seat belt at all times when driving. °· Make a list of emergency phone numbers, including numbers for family, friends, the hospital, and police and fire departments. °General instructions  °· Ask your health care provider for a referral to a local prenatal education class. Begin classes no later than the beginning of month 6 of your pregnancy. °· Ask for help if you have counseling or nutritional needs during pregnancy. Your health care provider can offer advice or refer you to specialists for help with various needs. °· Do not use hot tubs, steam rooms, or saunas. °· Do not douche or use tampons or scented sanitary pads. °· Do not cross your legs for long periods of time. °· Avoid cat litter boxes and soil used by cats. These carry germs  that can cause birth defects in the baby and possibly loss of the fetus by miscarriage or stillbirth. °· Avoid all smoking, herbs, alcohol, and medicines not prescribed by your health care provider. Chemicals in these products affect the formation and growth of the baby. °· Do not use any products that contain nicotine or tobacco, such as cigarettes and e-cigarettes. If you need help quitting, ask your health care provider. You may receive counseling support and other resources to   help you quit. °· Schedule a dentist appointment. At home, brush your teeth with a soft toothbrush and be gentle when you floss. °Contact a health care provider if: °· You have dizziness. °· You have mild pelvic cramps, pelvic pressure, or nagging pain in the abdominal area. °· You have persistent nausea, vomiting, or diarrhea. °· You have a bad smelling vaginal discharge. °· You have pain when you urinate. °· You notice increased swelling in your face, hands, legs, or ankles. °· You are exposed to fifth disease or chickenpox. °· You are exposed to German measles (rubella) and have never had it. °Get help right away if: °· You have a fever. °· You are leaking fluid from your vagina. °· You have spotting or bleeding from your vagina. °· You have severe abdominal cramping or pain. °· You have rapid weight gain or loss. °· You vomit blood or material that looks like coffee grounds. °· You develop a severe headache. °· You have shortness of breath. °· You have any kind of trauma, such as from a fall or a car accident. °Summary °· The first trimester of pregnancy is from week 1 until the end of week 13 (months 1 through 3). °· Your body goes through many changes during pregnancy. The changes vary from woman to woman. °· You will have routine prenatal visits. During those visits, your health care provider will examine you, discuss any test results you may have, and talk with you about how you are feeling. °This information is not intended to  replace advice given to you by your health care provider. Make sure you discuss any questions you have with your health care provider. °Document Released: 04/04/2001 Document Revised: 03/22/2016 Document Reviewed: 03/22/2016 °Elsevier Interactive Patient Education © 2017 [employer]. ° °

## 2016-09-25 LAB — GC/CHLAMYDIA PROBE AMP (~~LOC~~) NOT AT ARMC
Chlamydia: NEGATIVE
NEISSERIA GONORRHEA: NEGATIVE

## 2016-11-04 DIAGNOSIS — Z8759 Personal history of other complications of pregnancy, childbirth and the puerperium: Secondary | ICD-10-CM | POA: Insufficient documentation

## 2016-11-04 DIAGNOSIS — Z8659 Personal history of other mental and behavioral disorders: Secondary | ICD-10-CM | POA: Insufficient documentation

## 2016-11-04 DIAGNOSIS — N96 Recurrent pregnancy loss: Secondary | ICD-10-CM | POA: Insufficient documentation

## 2016-11-08 LAB — OB RESULTS CONSOLE ABO/RH: RH TYPE: POSITIVE

## 2016-11-08 LAB — OB RESULTS CONSOLE RPR: RPR: NONREACTIVE

## 2016-11-08 LAB — OB RESULTS CONSOLE HEPATITIS B SURFACE ANTIGEN: HEP B S AG: NEGATIVE

## 2016-11-08 LAB — OB RESULTS CONSOLE HIV ANTIBODY (ROUTINE TESTING): HIV: NONREACTIVE

## 2016-11-08 LAB — OB RESULTS CONSOLE RUBELLA ANTIBODY, IGM: RUBELLA: IMMUNE

## 2016-11-08 LAB — OB RESULTS CONSOLE ANTIBODY SCREEN: ANTIBODY SCREEN: NEGATIVE

## 2016-11-08 LAB — OB RESULTS CONSOLE GC/CHLAMYDIA
CHLAMYDIA, DNA PROBE: NEGATIVE
Gonorrhea: NEGATIVE

## 2017-02-01 ENCOUNTER — Inpatient Hospital Stay (HOSPITAL_COMMUNITY)
Admission: AD | Admit: 2017-02-01 | Discharge: 2017-02-01 | Disposition: A | Discharge status: Home / Self Care | Payer: Medicaid Other | Source: Ambulatory Visit | Attending: Obstetrics and Gynecology | Admitting: Obstetrics and Gynecology

## 2017-02-01 DIAGNOSIS — O26872 Cervical shortening, second trimester: Secondary | ICD-10-CM | POA: Diagnosis not present

## 2017-02-01 DIAGNOSIS — Z3A24 24 weeks gestation of pregnancy: Secondary | ICD-10-CM | POA: Insufficient documentation

## 2017-02-01 MED ORDER — BETAMETHASONE SOD PHOS & ACET 6 (3-3) MG/ML IJ SUSP
12.0000 mg | Freq: Once | INTRAMUSCULAR | Status: AC
Start: 2017-02-01 — End: 2017-02-01
  Administered 2017-02-01: 12 mg via INTRAMUSCULAR
  Filled 2017-02-01: qty 2

## 2017-02-01 NOTE — MAU Note (Signed)
Sent from office for betamethasone injection 

## 2017-02-02 ENCOUNTER — Inpatient Hospital Stay (HOSPITAL_COMMUNITY)
Admission: AD | Admit: 2017-02-02 | Discharge: 2017-02-02 | Disposition: A | Discharge status: Home / Self Care | Payer: Medicaid Other | Source: Ambulatory Visit | Attending: Obstetrics and Gynecology | Admitting: Obstetrics and Gynecology

## 2017-02-02 DIAGNOSIS — N899 Noninflammatory disorder of vagina, unspecified: Secondary | ICD-10-CM | POA: Insufficient documentation

## 2017-02-02 DIAGNOSIS — N889 Noninflammatory disorder of cervix uteri, unspecified: Secondary | ICD-10-CM | POA: Diagnosis present

## 2017-02-02 MED ORDER — BETAMETHASONE SOD PHOS & ACET 6 (3-3) MG/ML IJ SUSP
12.0000 mg | Freq: Once | INTRAMUSCULAR | Status: AC
  Administered 2017-02-02: 12 mg via INTRAMUSCULAR
  Filled 2017-02-02: qty 2

## 2017-02-02 NOTE — MAU Note (Signed)
Here for 2nd dose of betamethasone.  No problems yesterday."cervix is changing"

## 2017-04-24 NOTE — L&D Delivery Note (Addendum)
Delivery Note At 8:33 AM a viable female was delivered via Vaginal, Spontaneous (Presentation: cephalic).  APGAR: 8,9; weight pending.   Placenta status: spont intact sent to path.  Cord: 3V with the following complications: none.  Cord pH: n/a  Anesthesia: epidural  Episiotomy: None Lacerations: None Suture Repair: n/a Est. Blood Loss (mL): 200  Mom to postpartum.  Baby to Couplet care / Skin to Skin.  Morgan Galloway 05/06/2017, 8:58 AM

## 2017-04-25 ENCOUNTER — Encounter (HOSPITAL_COMMUNITY): Payer: Self-pay

## 2017-04-25 ENCOUNTER — Inpatient Hospital Stay (HOSPITAL_COMMUNITY)
Admission: AD | Admit: 2017-04-25 | Discharge: 2017-04-25 | Disposition: A | Discharge status: Home / Self Care | Payer: Medicaid Other | Source: Ambulatory Visit | Attending: Obstetrics and Gynecology | Admitting: Obstetrics and Gynecology

## 2017-04-25 DIAGNOSIS — Z3A36 36 weeks gestation of pregnancy: Secondary | ICD-10-CM | POA: Insufficient documentation

## 2017-04-25 DIAGNOSIS — O479 False labor, unspecified: Secondary | ICD-10-CM

## 2017-04-25 LAB — URINALYSIS, ROUTINE W REFLEX MICROSCOPIC
Bilirubin Urine: NEGATIVE
GLUCOSE, UA: NEGATIVE mg/dL
HGB URINE DIPSTICK: NEGATIVE
Ketones, ur: NEGATIVE mg/dL
Leukocytes, UA: NEGATIVE
Nitrite: NEGATIVE
PH: 7 (ref 5.0–8.0)
Protein, ur: NEGATIVE mg/dL
Specific Gravity, Urine: 1.008 (ref 1.005–1.030)

## 2017-04-25 NOTE — MAU Note (Signed)
I have communicated with Dr. Mora ApplPinn and reviewed vital signs:  Vitals:   04/25/17 0847 04/25/17 0919  BP: 127/82 112/76  Pulse: 77 80  Resp: 20   Temp: 98.5 F (36.9 C)     Vaginal exam:  Dilation: 2 Effacement (%): 70 Cervical Position: Posterior Station: -2 Presentation: Vertex Exam by:: Ginnie Smartachel Jacobey Gura RN,   Also reviewed contraction pattern and that non-stress test is reactive.  It has been documented that patient is contracting every 6 minutes with minimal cervical change over 1.5 hours not indicating active labor.  Patient denies any other complaints.  Based on this report provider has given order for discharge.  A discharge order and diagnosis entered by a provider.   Labor discharge instructions reviewed with patient.

## 2017-04-25 NOTE — MAU Note (Signed)
Pt here with c/o possible contractions since about 0200. Denies any leaking or bleeding. Reports good fetal movement.

## 2017-04-30 ENCOUNTER — Encounter (HOSPITAL_COMMUNITY): Payer: Self-pay | Admitting: *Deleted

## 2017-04-30 ENCOUNTER — Telehealth (HOSPITAL_COMMUNITY): Payer: Self-pay | Admitting: *Deleted

## 2017-04-30 NOTE — Telephone Encounter (Signed)
Preadmission screen  

## 2017-05-03 ENCOUNTER — Other Ambulatory Visit: Payer: Self-pay | Admitting: Obstetrics and Gynecology

## 2017-05-06 ENCOUNTER — Inpatient Hospital Stay (HOSPITAL_COMMUNITY)
Admission: RE | Admit: 2017-05-06 | Discharge: 2017-05-06 | Disposition: A | Discharge status: Home / Self Care | Payer: Medicaid Other | Source: Ambulatory Visit | Attending: Obstetrics & Gynecology | Admitting: Obstetrics & Gynecology

## 2017-05-06 ENCOUNTER — Inpatient Hospital Stay (HOSPITAL_COMMUNITY)
Admission: AD | Admit: 2017-05-06 | Discharge: 2017-05-08 | DRG: 806 | Disposition: A | Discharge status: Home / Self Care | Payer: Medicaid Other | Source: Intra-hospital | Attending: Obstetrics and Gynecology | Admitting: Obstetrics and Gynecology

## 2017-05-06 ENCOUNTER — Inpatient Hospital Stay (HOSPITAL_COMMUNITY): Payer: Medicaid Other | Admitting: Anesthesiology

## 2017-05-06 ENCOUNTER — Encounter (HOSPITAL_COMMUNITY): Payer: Self-pay

## 2017-05-06 DIAGNOSIS — O99824 Streptococcus B carrier state complicating childbirth: Secondary | ICD-10-CM | POA: Diagnosis present

## 2017-05-06 DIAGNOSIS — Z3A38 38 weeks gestation of pregnancy: Secondary | ICD-10-CM

## 2017-05-06 DIAGNOSIS — D696 Thrombocytopenia, unspecified: Secondary | ICD-10-CM | POA: Diagnosis present

## 2017-05-06 DIAGNOSIS — Z87891 Personal history of nicotine dependence: Secondary | ICD-10-CM | POA: Diagnosis not present

## 2017-05-06 DIAGNOSIS — O365930 Maternal care for other known or suspected poor fetal growth, third trimester, not applicable or unspecified: Secondary | ICD-10-CM | POA: Diagnosis present

## 2017-05-06 DIAGNOSIS — O9912 Other diseases of the blood and blood-forming organs and certain disorders involving the immune mechanism complicating childbirth: Secondary | ICD-10-CM | POA: Diagnosis present

## 2017-05-06 DIAGNOSIS — O365990 Maternal care for other known or suspected poor fetal growth, unspecified trimester, not applicable or unspecified: Secondary | ICD-10-CM | POA: Diagnosis present

## 2017-05-06 LAB — CBC
HEMATOCRIT: 41.5 % (ref 36.0–46.0)
Hemoglobin: 14.3 g/dL (ref 12.0–15.0)
MCH: 31.5 pg (ref 26.0–34.0)
MCHC: 34.5 g/dL (ref 30.0–36.0)
MCV: 91.4 fL (ref 78.0–100.0)
PLATELETS: 120 10*3/uL — AB (ref 150–400)
RBC: 4.54 MIL/uL (ref 3.87–5.11)
RDW: 12.8 % (ref 11.5–15.5)
WBC: 11.3 10*3/uL — AB (ref 4.0–10.5)

## 2017-05-06 LAB — TYPE AND SCREEN
ABO/RH(D): O POS
ANTIBODY SCREEN: NEGATIVE

## 2017-05-06 LAB — RPR: RPR Ser Ql: NONREACTIVE

## 2017-05-06 MED ORDER — BENZOCAINE-MENTHOL 20-0.5 % EX AERO
1.0000 "application " | INHALATION_SPRAY | CUTANEOUS | Status: DC | PRN
  Administered 2017-05-06: 1 via TOPICAL
  Filled 2017-05-06: qty 56

## 2017-05-06 MED ORDER — ONDANSETRON HCL 4 MG/2ML IJ SOLN: 4.0000 mg | Freq: Four times a day (QID) | INTRAMUSCULAR | Status: DC | PRN

## 2017-05-06 MED ORDER — ZOLPIDEM TARTRATE 5 MG PO TABS: 5.0000 mg | ORAL_TABLET | Freq: Every evening | ORAL | Status: DC | PRN

## 2017-05-06 MED ORDER — OXYTOCIN 40 UNITS IN LACTATED RINGERS INFUSION - SIMPLE MED: 1.0000 m[IU]/min | INTRAVENOUS | Status: DC

## 2017-05-06 MED ORDER — FENTANYL 2.5 MCG/ML BUPIVACAINE 1/10 % EPIDURAL INFUSION (WH - ANES)
INTRAMUSCULAR | Status: AC
  Filled 2017-05-06: qty 100

## 2017-05-06 MED ORDER — DIPHENHYDRAMINE HCL 50 MG/ML IJ SOLN: 12.5000 mg | INTRAMUSCULAR | Status: DC | PRN

## 2017-05-06 MED ORDER — ACETAMINOPHEN 325 MG PO TABS: 650.0000 mg | ORAL_TABLET | ORAL | Status: DC | PRN

## 2017-05-06 MED ORDER — EPHEDRINE 5 MG/ML INJ
10.0000 mg | INTRAVENOUS | Status: DC | PRN
  Filled 2017-05-06: qty 2

## 2017-05-06 MED ORDER — OXYCODONE HCL 5 MG PO TABS: 10.0000 mg | ORAL_TABLET | ORAL | Status: DC | PRN

## 2017-05-06 MED ORDER — OXYTOCIN BOLUS FROM INFUSION
500.0000 mL | Freq: Once | INTRAVENOUS | Status: AC
  Administered 2017-05-06: 500 mL via INTRAVENOUS

## 2017-05-06 MED ORDER — PHENYLEPHRINE 40 MCG/ML (10ML) SYRINGE FOR IV PUSH (FOR BLOOD PRESSURE SUPPORT)
80.0000 ug | PREFILLED_SYRINGE | INTRAVENOUS | Status: DC | PRN
  Filled 2017-05-06: qty 5

## 2017-05-06 MED ORDER — FENTANYL 2.5 MCG/ML BUPIVACAINE 1/10 % EPIDURAL INFUSION (WH - ANES)
14.0000 mL/h | INTRAMUSCULAR | Status: DC | PRN
  Administered 2017-05-06: 14 mL/h via EPIDURAL

## 2017-05-06 MED ORDER — MISOPROSTOL 25 MCG QUARTER TABLET
25.0000 ug | ORAL_TABLET | ORAL | Status: DC | PRN
  Administered 2017-05-06: 25 ug via VAGINAL
  Filled 2017-05-06 (×2): qty 1

## 2017-05-06 MED ORDER — LACTATED RINGERS IV SOLN: 500.0000 mL | Freq: Once | INTRAVENOUS | Status: DC

## 2017-05-06 MED ORDER — SENNOSIDES-DOCUSATE SODIUM 8.6-50 MG PO TABS
2.0000 | ORAL_TABLET | ORAL | Status: DC
  Administered 2017-05-07 (×2): 2 via ORAL
  Filled 2017-05-06 (×2): qty 2

## 2017-05-06 MED ORDER — SIMETHICONE 80 MG PO CHEW: 80.0000 mg | CHEWABLE_TABLET | ORAL | Status: DC | PRN

## 2017-05-06 MED ORDER — ONDANSETRON HCL 4 MG PO TABS: 4.0000 mg | ORAL_TABLET | ORAL | Status: DC | PRN

## 2017-05-06 MED ORDER — OXYCODONE HCL 5 MG PO TABS: 5.0000 mg | ORAL_TABLET | ORAL | Status: DC | PRN

## 2017-05-06 MED ORDER — FENTANYL CITRATE (PF) 100 MCG/2ML IJ SOLN: 50.0000 ug | INTRAMUSCULAR | Status: DC | PRN

## 2017-05-06 MED ORDER — LIDOCAINE HCL (PF) 1 % IJ SOLN
30.0000 mL | INTRAMUSCULAR | Status: DC | PRN
  Filled 2017-05-06: qty 30

## 2017-05-06 MED ORDER — DEXTROSE 5 % IV SOLN
5.0000 10*6.[IU] | Freq: Once | INTRAVENOUS | Status: AC
  Administered 2017-05-06: 5 10*6.[IU] via INTRAVENOUS
  Filled 2017-05-06: qty 5

## 2017-05-06 MED ORDER — WITCH HAZEL-GLYCERIN EX PADS: 1.0000 "application " | MEDICATED_PAD | CUTANEOUS | Status: DC | PRN

## 2017-05-06 MED ORDER — ONDANSETRON HCL 4 MG/2ML IJ SOLN: 4.0000 mg | INTRAMUSCULAR | Status: DC | PRN

## 2017-05-06 MED ORDER — OXYTOCIN 40 UNITS IN LACTATED RINGERS INFUSION - SIMPLE MED
2.5000 [IU]/h | INTRAVENOUS | Status: DC
  Filled 2017-05-06: qty 1000

## 2017-05-06 MED ORDER — COCONUT OIL OIL: 1.0000 "application " | TOPICAL_OIL | Status: DC | PRN

## 2017-05-06 MED ORDER — PHENYLEPHRINE 40 MCG/ML (10ML) SYRINGE FOR IV PUSH (FOR BLOOD PRESSURE SUPPORT)
PREFILLED_SYRINGE | INTRAVENOUS | Status: AC
  Filled 2017-05-06: qty 10

## 2017-05-06 MED ORDER — TERBUTALINE SULFATE 1 MG/ML IJ SOLN
0.2500 mg | Freq: Once | INTRAMUSCULAR | Status: AC | PRN
  Administered 2017-05-06: 0.25 mg via SUBCUTANEOUS

## 2017-05-06 MED ORDER — DIBUCAINE 1 % RE OINT: 1.0000 "application " | TOPICAL_OINTMENT | RECTAL | Status: DC | PRN

## 2017-05-06 MED ORDER — TETANUS-DIPHTH-ACELL PERTUSSIS 5-2.5-18.5 LF-MCG/0.5 IM SUSP: 0.5000 mL | Freq: Once | INTRAMUSCULAR | Status: DC

## 2017-05-06 MED ORDER — FLEET ENEMA 7-19 GM/118ML RE ENEM: 1.0000 | ENEMA | RECTAL | Status: DC | PRN

## 2017-05-06 MED ORDER — MEDROXYPROGESTERONE ACETATE 150 MG/ML IM SUSP: 150.0000 mg | INTRAMUSCULAR | Status: DC | PRN

## 2017-05-06 MED ORDER — LIDOCAINE HCL (PF) 1 % IJ SOLN
INTRAMUSCULAR | Status: DC | PRN
  Administered 2017-05-06: 8 mL via EPIDURAL
  Administered 2017-05-06: 4 mL via EPIDURAL

## 2017-05-06 MED ORDER — SOD CITRATE-CITRIC ACID 500-334 MG/5ML PO SOLN: 30.0000 mL | ORAL | Status: DC | PRN

## 2017-05-06 MED ORDER — IBUPROFEN 600 MG PO TABS
600.0000 mg | ORAL_TABLET | Freq: Four times a day (QID) | ORAL | Status: DC
  Administered 2017-05-06 – 2017-05-08 (×8): 600 mg via ORAL
  Filled 2017-05-06 (×8): qty 1

## 2017-05-06 MED ORDER — PENICILLIN G POT IN DEXTROSE 60000 UNIT/ML IV SOLN
3.0000 10*6.[IU] | INTRAVENOUS | Status: DC
  Administered 2017-05-06: 3 10*6.[IU] via INTRAVENOUS
  Filled 2017-05-06 (×9): qty 50

## 2017-05-06 MED ORDER — LACTATED RINGERS IV SOLN
INTRAVENOUS | Status: DC
  Administered 2017-05-06: 01:00:00 via INTRAVENOUS

## 2017-05-06 MED ORDER — TERBUTALINE SULFATE 1 MG/ML IJ SOLN
0.2500 mg | Freq: Once | INTRAMUSCULAR | Status: DC | PRN
  Filled 2017-05-06: qty 1

## 2017-05-06 MED ORDER — PRENATAL MULTIVITAMIN CH
1.0000 | ORAL_TABLET | Freq: Every day | ORAL | Status: DC
  Administered 2017-05-07 – 2017-05-08 (×2): 1 via ORAL
  Filled 2017-05-06 (×2): qty 1

## 2017-05-06 MED ORDER — LACTATED RINGERS IV SOLN
500.0000 mL | INTRAVENOUS | Status: DC | PRN
  Administered 2017-05-06 (×2): 500 mL via INTRAVENOUS

## 2017-05-06 MED ORDER — DIPHENHYDRAMINE HCL 25 MG PO CAPS: 25.0000 mg | ORAL_CAPSULE | Freq: Four times a day (QID) | ORAL | Status: DC | PRN

## 2017-05-06 NOTE — Progress Notes (Signed)
Pt pushing with contractions, MD at bedside

## 2017-05-06 NOTE — Progress Notes (Signed)
Patient ID: Morgan HornJoshea A Petrizzo, female   DOB: 1990-11-30, 27 y.o.   MRN: [id number] S: Called to room by RN stating that pt having late decelerations; Pt just got epidural and still not comfortable O: late decelerations noted post epidural placement      Contractions q1-2      FHT's- CAT 2      VE- 3/70/-2 A: IUGR induction P: Terbutaline      Hold Pitocin.  Illene BolusLori Pete Schnitzer CNM

## 2017-05-06 NOTE — Anesthesia Procedure Notes (Signed)
Epidural Patient location during procedure: OB Start time: 05/06/2017 6:14 AM End time: 05/06/2017 6:17 AM  Staffing Anesthesiologist: Beryle LatheBrock, Thomas E, MD Performed: anesthesiologist   Preanesthetic Checklist Completed: patient identified, pre-op evaluation, timeout performed, IV checked, risks and benefits discussed and monitors and equipment checked  Epidural Patient position: sitting Prep: DuraPrep Patient monitoring: continuous pulse ox and blood pressure Approach: midline Location: L2-L3 Injection technique: LOR saline  Needle:  Needle type: Tuohy  Needle gauge: 17 G Needle length: 9 cm Needle insertion depth: 5 cm Catheter size: 19 Gauge Catheter at skin depth: 10 cm Test dose: negative and Other (1% lidocaine)  Additional Notes Patient identified. Risks including, but not limited to, bleeding, infection, nerve damage, paralysis, inadequate analgesia, blood pressure changes, nausea, vomiting, allergic reaction, postpartum back pain, itching, and headache were discussed. Patient expressed understanding and wished to proceed. Sterile prep and drape, including hand hygiene, mask, and sterile gloves were used. The patient was positioned and the spine was prepped. The skin was anesthetized with lidocaine. No paraesthesia or other complication noted. The patient did not experience any signs of intravascular injection such as tinnitus or metallic taste in mouth, nor signs of intrathecal spread such as rapid motor block. Please see nursing notes for vital signs. The patient tolerated the procedure well.   Leslye Peerhomas Brock, MDReason for block:procedure for pain

## 2017-05-06 NOTE — Anesthesia Preprocedure Evaluation (Signed)
Anesthesia Evaluation  Patient identified by MRN, date of birth, ID band Patient awake    Reviewed: Allergy & Precautions, H&P , NPO status , Patient's Chart, lab work & pertinent test results  Airway Mallampati: II  TM Distance: >3 FB Neck ROM: full    Dental no notable dental hx. (+) Teeth Intact   Pulmonary former smoker,    Pulmonary exam normal breath sounds clear to auscultation       Cardiovascular Exercise Tolerance: Good negative cardio ROS   Rhythm:Regular Rate:Normal     Neuro/Psych  Headaches, Depression    GI/Hepatic negative GI ROS, Neg liver ROS,   Endo/Other  negative endocrine ROS  Renal/GU negative Renal ROS  negative genitourinary   Musculoskeletal   Abdominal   Peds  Hematology Thrombocytopenia   Anesthesia Other Findings   Reproductive/Obstetrics negative OB ROS (+) Pregnancy                             Anesthesia Physical  Anesthesia Plan  ASA: II  Anesthesia Plan: Epidural   Post-op Pain Management:    Induction:   PONV Risk Score and Plan:   Airway Management Planned: Natural Airway  Additional Equipment:   Intra-op Plan:   Post-operative Plan:   Informed Consent: I have reviewed the patients History and Physical, chart, labs and discussed the procedure including the risks, benefits and alternatives for the proposed anesthesia with the patient or authorized representative who has indicated his/her understanding and acceptance.     Plan Discussed with:   Anesthesia Plan Comments: (Labs reviewed. Platelets acceptable, patient not taking any blood thinning medications. Risks and benefits discussed with patient, patient expressed understanding and wished to proceed.)        Anesthesia Quick Evaluation

## 2017-05-06 NOTE — H&P (Signed)
Morgan HornJoshea A Galloway is a 27 y.o. female presenting for induction of labor due to IUGR OB History    Gravida Para Term Preterm AB Living   6 1 1  0 4 1   SAB TAB Ectopic Multiple Live Births   3     0 1     Past Medical History:  Diagnosis Date  . Depression    Zoloft in past; stopped due to potential of pregnancy.  . Gonorrhea   . Headache   . History of stillbirth    June 2012  . Infection    UTI  . Vaginal Pap smear, abnormal    f/u ok   Past Surgical History:  Procedure Laterality Date  . NO PAST SURGERIES     Family History: family history includes Bipolar disorder in her mother and sister; Cancer in her maternal grandmother; Diabetes in her maternal grandmother; Hypertension in her father; Stroke (age of onset: 765) in her father. Social History:  reports that she quit smoking about 8 months ago. Her smoking use included cigarettes. She smoked 0.50 packs per day. she has never used smokeless tobacco. She reports that she drinks alcohol. She reports that she does not use drugs.     Maternal Diabetes: No Genetic Screening: Declined Maternal Ultrasounds/Referrals: Abnormal:  Findings:   IUGR Fetal Ultrasounds or other Referrals:  None Maternal Substance Abuse:  No Significant Maternal Medications:  None Significant Maternal Lab Results:  Lab values include: Group B Strep positive Other Comments:  None  ROS Maternal Medical History:  Fetal activity: Perceived fetal activity is normal.   Last perceived fetal movement was within the past hour.    Prenatal complications: IUGR and thrombocytopenia.     Dilation: 2 Effacement (%): 50 Station: -3 Exam by:: Veronica Mensah Blood pressure 126/70, pulse 85, temperature 98.1 F (36.7 C), temperature source Oral, resp. rate 20, height 5\' 4"  (1.626 m), weight 158 lb 0.3 oz (71.7 kg), last menstrual period 08/11/2016, SpO2 100 %, unknown if currently breastfeeding. Maternal Exam:  Abdomen: Patient reports no abdominal tenderness.  Estimated fetal weight is appox 5 1/2 pounds.   Fetal presentation: vertex  Introitus: Normal vulva. Normal vagina.  Pelvis: adequate for delivery.      Fetal Exam Fetal Monitor Review: Mode: ultrasound.   Variability: moderate (6-25 bpm).   Pattern: accelerations present.    Fetal State Assessment: Category I - tracings are normal.     Physical Exam  Nursing note and vitals reviewed. Constitutional: She is oriented to person, place, and time. She appears well-developed and well-nourished.  HENT:  Head: Normocephalic and atraumatic.  Neck: Normal range of motion.  Cardiovascular: Normal rate.  Respiratory: Effort normal.  GI: Soft. She exhibits no distension.  Genitourinary: Vagina normal.  Musculoskeletal: Normal range of motion.  Neurological: She is alert and oriented to person, place, and time.  Skin: Skin is warm and dry.  Psychiatric: She has a normal mood and affect. Her behavior is normal. Thought content normal.    Results for orders placed or performed during the hospital encounter of 05/06/17 (from the past 24 hour(s))  CBC     Status: Abnormal   Collection Time: 05/06/17 12:59 AM  Result Value Ref Range   WBC 11.3 (H) 4.0 - 10.5 K/uL   RBC 4.54 3.87 - 5.11 MIL/uL   Hemoglobin 14.3 12.0 - 15.0 g/dL   HCT 45.441.5 09.836.0 - 11.946.0 %   MCV 91.4 78.0 - 100.0 fL   MCH 31.5 26.0 -  34.0 pg   MCHC 34.5 30.0 - 36.0 g/dL   RDW 16.1 09.6 - 04.5 %   Platelets 120 (L) 150 - 400 K/uL  Type and screen     Status: None   Collection Time: 05/06/17 12:59 AM  Result Value Ref Range   ABO/RH(D) O POS    Antibody Screen NEG    Sample Expiration 05/09/2017    Prenatal labs: ABO, Rh: --/--/O POS (01/13 0059) Antibody: NEG (01/13 0059) Rubella: Immune (07/18 0000) RPR: Nonreactive (07/18 0000)  HBsAg: Negative (07/18 0000)  HIV: Non-reactive (07/18 0000)  GBS:    Assessment/Plan: IUGR Thrombocytopenia GBS positive History of depression Cytotec for cervical  Ripening Routine L/D orders  Anticipate vaginal  delivery   Lori A Clemmons CNM 05/06/2017, 3:09 AM

## 2017-05-06 NOTE — Anesthesia Postprocedure Evaluation (Signed)
Anesthesia Post Note  Patient: Morgan Galloway  Procedure(s) Performed: AN AD HOC LABOR EPIDURAL     Patient location during evaluation: Mother Baby Anesthesia Type: Epidural Level of consciousness: awake and alert Pain management: pain level controlled Vital Signs Assessment: post-procedure vital signs reviewed and stable Respiratory status: spontaneous breathing, nonlabored ventilation and respiratory function stable Cardiovascular status: stable Postop Assessment: no headache, no backache, epidural receding and patient able to bend at knees Anesthetic complications: no    Last Vitals:  Vitals:   05/06/17 1126 05/06/17 1540  BP: (!) 102/40 (!) 110/48  Pulse: 71 69  Resp: 16 18  Temp: 36.7 C 36.7 C  SpO2:      Last Pain:  Vitals:   05/06/17 1540  TempSrc: Oral  PainSc: 0-No pain   Pain Goal: Patients Stated Pain Goal: 2 (05/06/17 0240)               Rica RecordsICKELTON,Carlee Tesfaye

## 2017-05-06 NOTE — Progress Notes (Signed)
Sign out to Dr. Su Hiltoberts.

## 2017-05-06 NOTE — Progress Notes (Signed)

## 2017-05-07 LAB — CBC
HCT: 35.6 % — ABNORMAL LOW (ref 36.0–46.0)
Hemoglobin: 12.3 g/dL (ref 12.0–15.0)
MCH: 31.6 pg (ref 26.0–34.0)
MCHC: 34.6 g/dL (ref 30.0–36.0)
MCV: 91.5 fL (ref 78.0–100.0)
Platelets: 111 10*3/uL — ABNORMAL LOW (ref 150–400)
RBC: 3.89 MIL/uL (ref 3.87–5.11)
RDW: 13 % (ref 11.5–15.5)
WBC: 12.9 10*3/uL — AB (ref 4.0–10.5)

## 2017-05-07 MED ORDER — MEDROXYPROGESTERONE ACETATE 150 MG/ML IM SUSP
150.0000 mg | Freq: Once | INTRAMUSCULAR | Status: AC
  Administered 2017-05-08: 150 mg via INTRAMUSCULAR
  Filled 2017-05-07: qty 1

## 2017-05-07 NOTE — Progress Notes (Signed)
Subjective: Postpartum Day 1: Vaginal delivery, no laceration Patient up ad lib, reports no syncope or dizziness. Feeding:  Breast Contraceptive plan:  Depo prior to d/c tomorrow  Objective: Vital signs in last 24 hours: Temp:  [97.9 F (36.6 C)-98.4 F (36.9 C)] 98.4 F (36.9 C) (01/14 0534) Pulse Rate:  [62-136] 64 (01/14 0534) Resp:  [16-20] 16 (01/14 0534) BP: (89-157)/(39-117) 109/50 (01/14 0534)   Vitals:   05/06/17 1126 05/06/17 1540 05/07/17 0020 05/07/17 0534  BP: (!) 102/40 (!) 110/48 (!) 113/39 (!) 109/50  Pulse: 71 69 62 64  Resp: 16 18 18 16   Temp: 98.1 F (36.7 C) 98 F (36.7 C) 98.1 F (36.7 C) 98.4 F (36.9 C)  TempSrc: Oral Oral Oral Oral  SpO2:      Weight:      Height:        Physical Exam:  General: alert Lochia: appropriate Uterine Fundus: firm Perineum: Intact perineum DVT Evaluation: No evidence of DVT seen on physical exam. Negative Homan's sign.   CBC Latest Ref Rng & Units 05/07/2017 05/06/2017 09/22/2016  WBC 4.0 - 10.5 K/uL 12.9(H) 11.3(H) 8.8  Hemoglobin 12.0 - 15.0 g/dL 04.512.3 40.914.3 81.113.1  Hematocrit 36.0 - 46.0 % 35.6(L) 41.5 37.4  Platelets 150 - 400 K/uL 111(L) 120(L) 173     Assessment/Plan: Status post vaginal delivery day 1--induced due to IUGR. Mild thrombocytopenia--recheck at 6 weeks Stable Continue current care. Plan for discharge tomorrow  Will give Depo prior to d/c tomorrow.    Nigel BridgemanVicki Tomma Ehinger CNM 05/07/2017, 8:12 AM

## 2017-05-07 NOTE — Lactation Note (Signed)
This note was copied from a baby's chart. Lactation Consultation Note  Patient Name: Morgan Galloway WUJWJ'XToday's Date: 05/07/2017 Reason for consult: Initial assessment;Infant < 6lbs   Initial consult with Exp BF mom of 34 hour old infant at RN request. Infant with 11 BF , 6 BF attempts, 3 voids and 1 stools in the last 24 hours. Infant with no stools in life.   Asked RN to get mom a DEBP set up and hand expression to offer infant supplement. Mom pumped and obtained about 2 cc and hand expressed and easily obtained an additional 2 cc. Mom is planning to continue hand expression after she has her dinner.   Had mom latch infant to the right breast in the cross cradle hold infant latched easily with rhythmic suckles and a few swallows. Nipple was asymmetrical with latch after 2 latches. Worked with mom on positioning, hand expression and latching infant in a deeper latch. Despite positioning and lip flanging the infant is still compressing the nipple with this feeding. Reviewed with mom the importance of pumping every 2-3 hours after BF and follow with hand expression to use to supplement infant and encourage milk to come in.   Infant was finger/syringe fed after BF the 4 cc colostrum and tolerated it well. Mom may have to spoon feed due to long fingernails, no other family members are in the room.   Caring for your Late Preterm infant handout given due to infant size. Enc mom to decrease stimulation to the infant and to make sure infant feeds at least every 3 hours around the clock. Mom voiced understanding.   Mom does not have a pump at home and is [facility], [facility].   BF Resources handout and LC Brochure given, mom informed of IP/OP Services, BF Support Groups and LC phone #. Enc mom to call out for feeding assistance as needed.      Maternal Data Formula Feeding for Exclusion: No Has patient been taught Hand Expression?: Yes Does  the patient have breastfeeding experience prior to this delivery?: Yes  Feeding Feeding Type: Breast Fed Length of feed: 15 min  LATCH Score Latch: Repeated attempts needed to sustain latch, nipple held in mouth throughout feeding, stimulation needed to elicit sucking reflex.  Audible Swallowing: A few with stimulation  Type of Nipple: Everted at rest and after stimulation  Comfort (Breast/Nipple): Soft / non-tender  Hold (Positioning): Assistance needed to correctly position infant at breast and maintain latch.  LATCH Score: 7  Interventions Interventions: Breast feeding basics reviewed;Adjust position;DEBP;Assisted with latch;Support pillows;Skin to skin;Position options;Breast massage;Breast compression;Expressed milk  Lactation Tools Discussed/Used WIC Program: No(Plans to apply) Pump Review: Setup, frequency, and cleaning;Milk Storage Initiated by:: Bedside RN, reviewed and encouraged every 2-3 hours   Consult Status Consult Status: Follow-up Date: 05/08/17 Follow-up type: In-patient    Silas FloodSharon S Gabriella Guile 05/07/2017, 6:56 PM

## 2017-05-08 MED ORDER — IBUPROFEN 600 MG PO TABS: 600.0000 mg | ORAL_TABLET | Freq: Four times a day (QID) | ORAL | 2 refills | Status: DC | PRN

## 2017-05-08 NOTE — Discharge Instructions (Signed)
Postpartum Care After Vaginal Delivery °The period of time right after you deliver your newborn is called the postpartum period. °What kind of medical care will I receive? °· You may continue to receive fluids and medicines through an IV tube inserted into one of your veins. °· If an incision was made near your vagina (episiotomy) or if you had some vaginal tearing during delivery, cold compresses may be placed on your episiotomy or your tear. This helps to reduce pain and swelling. °· You may be given a squirt bottle to use when you go to the bathroom. You may use this until you are comfortable wiping as usual. To use the squirt bottle, follow these steps: °? Before you urinate, fill the squirt bottle with warm water. Do not use hot water. °? After you urinate, while you are sitting on the toilet, use the squirt bottle to rinse the area around your urethra and vaginal opening. This rinses away any urine and blood. °? You may do this instead of wiping. As you start healing, you may use the squirt bottle before wiping yourself. Make sure to wipe gently. °? Fill the squirt bottle with clean water every time you use the bathroom. °· You will be given sanitary pads to wear. °How can I expect to feel? °· You may not feel the need to urinate for several hours after delivery. °· You will have some soreness and pain in your abdomen and vagina. °· If you are breastfeeding, you may have uterine contractions every time you breastfeed for up to several weeks postpartum. Uterine contractions help your uterus return to its normal size. °· It is normal to have vaginal bleeding (lochia) after delivery. The amount and appearance of lochia is often similar to a menstrual period in the first week after delivery. It will gradually decrease over the next few weeks to a dry, yellow-brown discharge. For most women, lochia stops completely by 6-8 weeks after delivery. Vaginal bleeding can vary from woman to woman. °· Within the first few  days after delivery, you may have breast engorgement. This is when your breasts feel heavy, full, and uncomfortable. Your breasts may also throb and feel hard, tightly stretched, warm, and tender. After this occurs, you may have milk leaking from your breasts. Your health care provider can help you relieve discomfort due to breast engorgement. Breast engorgement should go away within a few days. °· You may feel more sad or worried than normal due to hormonal changes after delivery. These feelings should not last more than a few days. If these feelings do not go away after several days, speak with your health care provider. °How should I care for myself? °· Tell your health care provider if you have pain or discomfort. °· Drink enough water to keep your urine clear or pale yellow. °· Wash your hands thoroughly with soap and water for at least 20 seconds after changing your sanitary pads, after using the toilet, and before holding or feeding your baby. °· If you are not breastfeeding, avoid touching your breasts a lot. Doing this can make your breasts produce more milk. °· If you become weak or lightheaded, or you feel like you might faint, ask for help before: °? Getting out of bed. °? Showering. °· Change your sanitary pads frequently. Watch for any changes in your flow, such as a sudden increase in volume, a change in color, the passing of large blood clots. If you pass a blood clot from your vagina, save it   to show to your health care provider. Do not flush blood clots down the toilet without having your health care provider look at them. °· Make sure that all your vaccinations are up to date. This can help protect you and your baby from getting certain diseases. You may need to have immunizations done before you leave the hospital. °· If desired, talk with your health care provider about methods of family planning or birth control (contraception). °How can I start bonding with my baby? °Spending as much time as  possible with your baby is very important. During this time, you and your baby can get to know each other and develop a bond. Having your baby stay with you in your room (rooming in) can give you time to get to know your baby. Rooming in can also help you become comfortable caring for your baby. Breastfeeding can also help you bond with your baby. °How can I plan for returning home with my baby? °· Make sure that you have a car seat installed in your vehicle. °? Your car seat should be checked by a certified car seat installer to make sure that it is installed safely. °? Make sure that your baby fits into the car seat safely. °· Ask your health care provider any questions you have about caring for yourself or your baby. Make sure that you are able to contact your health care provider with any questions after leaving the hospital. °This information is not intended to replace advice given to you by your health care provider. Make sure you discuss any questions you have with your health care provider. °Document Released: 02/05/2007 Document Revised: 09/13/2015 Document Reviewed: 03/15/2015 °Elsevier Interactive Patient Education © 2018 [employer] ° °

## 2017-05-08 NOTE — Discharge Summary (Signed)
OB Discharge Summary     Patient Name: Morgan Galloway DOB: October 12, 1990 MRN: [id number]  Date of admission: 05/06/2017 Delivering MD: Osborn Coho   Date of discharge: 05/08/2017  Admitting diagnosis: Induction, IUGR Intrauterine pregnancy: [redacted]w[redacted]d     Secondary diagnosis:  Principal Problem:   Vaginal delivery  Additional problems: None     Discharge diagnosis: Term Pregnancy Delivered, IUGR                                                                                                Post partum procedures:NA  Augmentation: AROM, Pitocin and Cytotec  Complications: None  Hospital course:  Induction of Labor With Vaginal Delivery   27 y.o. yo [id number] at [redacted]w[redacted]d was admitted to the hospital 05/06/2017 for induction of labor.  Indication for induction: IUGR.  Patient had an uncomplicated labor course as follows: Membrane Rupture Time/Date: 8:27 AM ,05/06/2017   Intrapartum Procedures: Episiotomy: None [1]                                         Lacerations:  None [1]  Patient had delivery of a Viable infant.  Information for the patient's newborn:  David, Towson [[id number]]  Delivery Method: Vaginal, Spontaneous(Filed from Delivery Summary)   05/06/2017  Details of delivery can be found in separate delivery note.  Patient had a routine postpartum course. Patient is discharged home 05/08/17.  Physical exam  Vitals:   05/06/17 1540 05/07/17 0020 05/07/17 0534 05/08/17 0439  BP: (!) 110/48 (!) 113/39 (!) 109/50 (!) 113/57  Pulse: 69 62 64 (!) 58  Resp: 18 18 16 16   Temp: 98 F (36.7 C) 98.1 F (36.7 C) 98.4 F (36.9 C) 98.1 F (36.7 C)  TempSrc: Oral Oral Oral Oral  SpO2:      Weight:      Height:       General: alert Lochia: appropriate Uterine Fundus: firm Incision: Perineum intact DVT Evaluation: No evidence of DVT seen on physical exam. Negative Homan's sign. Labs:  CBC Latest Ref Rng & Units 05/07/2017 05/06/2017 09/22/2016  WBC 4.0 - 10.5 K/uL 12.9(H)  11.3(H) 8.8  Hemoglobin 12.0 - 15.0 g/dL 13.0 86.5 78.4  Hematocrit 36.0 - 46.0 % 35.6(L) 41.5 37.4  Platelets 150 - 400 K/uL 111(L) 120(L) 173   CMP Latest Ref Rng & Units 08/25/2014  Glucose 70 - 99 mg/dL 72  BUN 6 - 20 mg/dL 10  Creatinine 6.96 - 2.95 mg/dL 2.84  Sodium 132 - 440 mmol/L 135  Potassium 3.5 - 5.1 mmol/L 4.2  Chloride 101 - 111 mmol/L 105  CO2 22 - 32 mmol/L 23  Calcium 8.9 - 10.3 mg/dL 1.0(U)  Total Protein 6.5 - 8.1 g/dL 6.6  Total Bilirubin 0.3 - 1.2 mg/dL 7.2(Z)  Alkaline Phos 38 - 126 U/L 104  AST 15 - 41 U/L 24  ALT 14 - 54 U/L 13(L)    Discharge instruction: per After Visit Summary and "Baby and Me Booklet".  After visit  meds:  Allergies as of 05/08/2017   No Known Allergies     Medication List    TAKE these medications   calcium carbonate 500 MG chewable tablet Commonly known as:  TUMS - dosed in mg elemental calcium Chew 2 tablets by mouth daily as needed for indigestion or heartburn.   ibuprofen 600 MG tablet Commonly known as:  ADVIL,MOTRIN Take 1 tablet (600 mg total) by mouth every 6 (six) hours as needed.   multivitamin-prenatal 27-0.8 MG Tabs tablet Take 1 tablet by mouth daily at 12 noon.       Diet: routine diet  Activity: Advance as tolerated. Pelvic rest for 6 weeks.   Outpatient follow up:6 weeks Follow up Appt:No future appointments. Follow up Visit:No Follow-up on file.  Postpartum contraception: Depo Provera--first dose on day of d/c  Newborn Data: Live born female  Birth Weight: 5 lb 12.4 oz (2620 g) APGAR: 8, 9  Newborn Delivery   Birth date/time:  05/06/2017 08:33:00 Delivery type:  Vaginal, Spontaneous     Baby Feeding: Breast Disposition:home with mother   05/08/2017 Nigel BridgemanVicki Dannae Kato, CNM

## 2017-05-08 NOTE — Lactation Note (Signed)
This note was copied from a baby's chart. Lactation Consultation Note  Patient Name: Morgan Galloway ZOXWR'UToday's Date: 05/08/2017 Reason for consult: Follow-up assessment   Baby 49 hours old. < 6 lbs.  Stools have transitioned. Mother is hand expressing, breastfeeding and supplementing with her breastmilk via slow flow nipple. She hand expressed approx 15 ml this morning. Mother denies concerns or questions. Offered manual pump for discharge and declined mother stating she has DEBP and would prefer to hand express. Reviewed milk storage. Mom encouraged to feed baby 8-12 times/24 hours and with feeding cues at least q hours.  Suggest calling if mother has further questions.      Maternal Data    Feeding Feeding Type: Breast Fed Length of feed: 20 min  LATCH Score                   Interventions    Lactation Tools Discussed/Used     Consult Status Consult Status: Complete    Hardie PulleyBerkelhammer, Jalayia Bagheri Boschen 05/08/2017, 10:18 AM

## 2018-07-14 IMAGING — US US OB COMP LESS 14 WK
1 series · 15 of 28 positions shown · non-contrast
Comparison: None.

CLINICAL DATA: Vaginal bleeding and pelvic pain in first trimester
pregnancy. Gestational age by LMP of 5 weeks 4 days.

EXAM:
OBSTETRIC <14 WK US AND TRANSVAGINAL OB US
TECHNIQUE: Both transabdominal and transvaginal ultrasound examinations were
performed for complete evaluation of the gestation as well as the
maternal uterus, adnexal regions, and pelvic cul-de-sac.
Transvaginal technique was performed to assess early pregnancy.

[Series 1: us ob comp less 14 wk · 58 acquisitions, 15 frames shown]
[im 1/58]
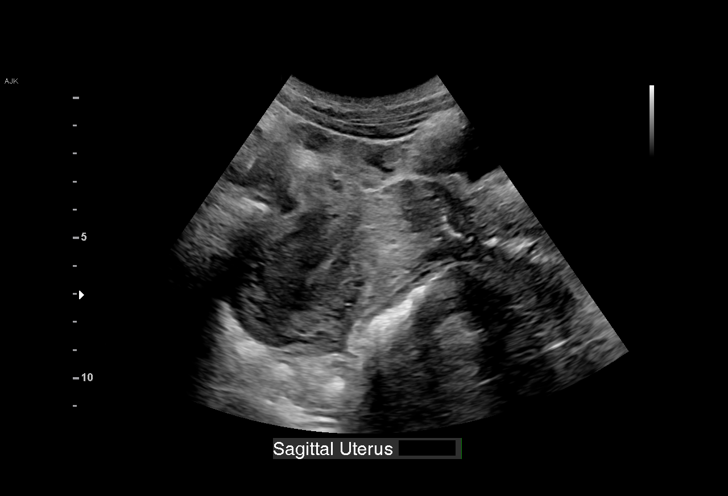
[im 5/58]
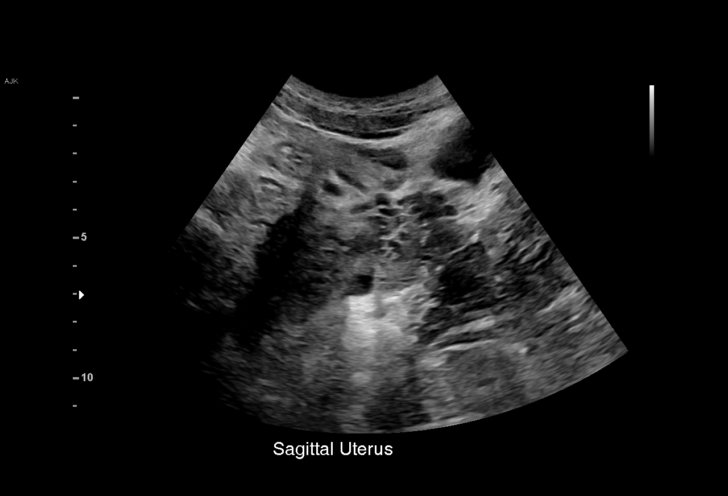
[im 9/58]
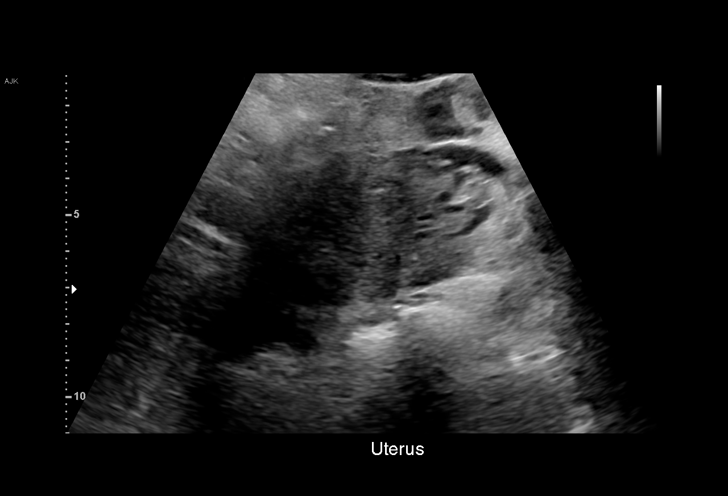
[im 13/58]
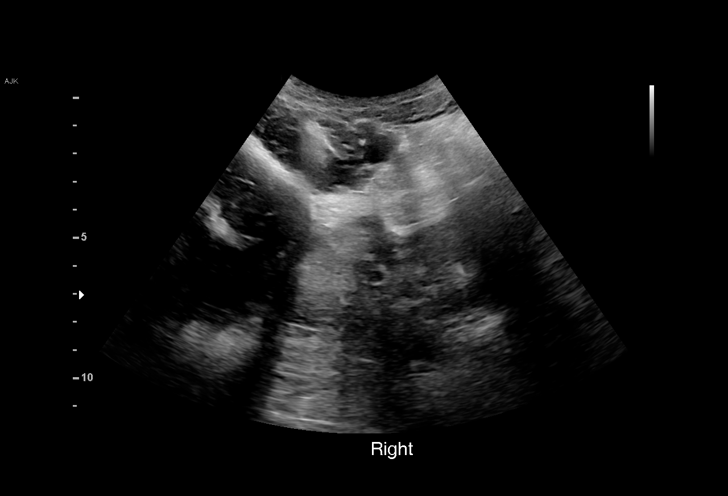
[im 17/58]
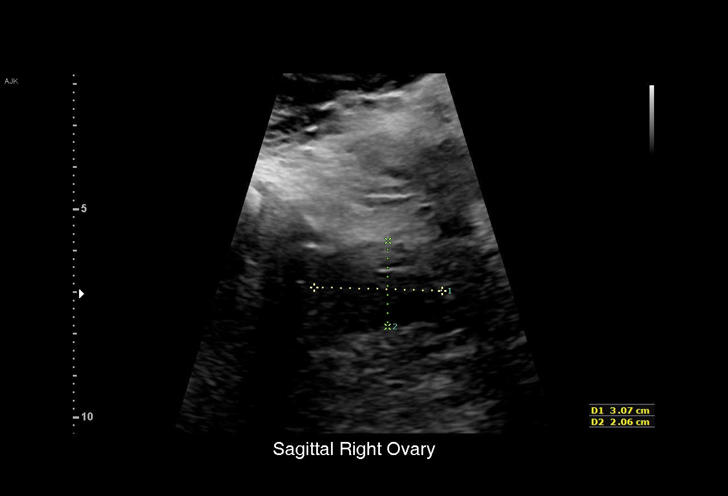
[im 22/58]
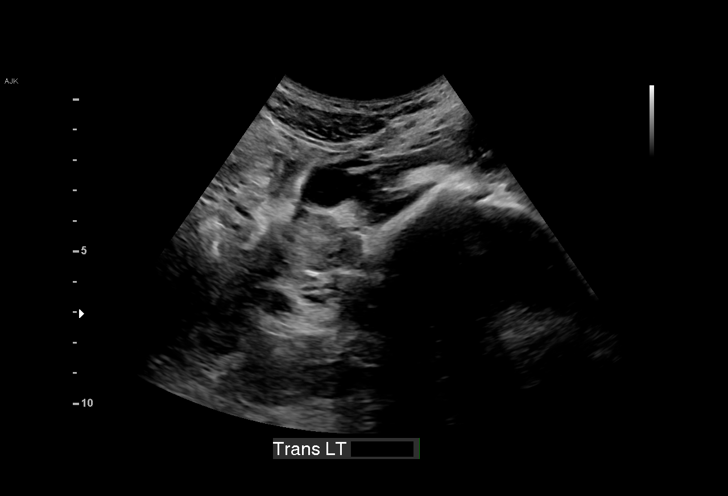
[im 26/58]
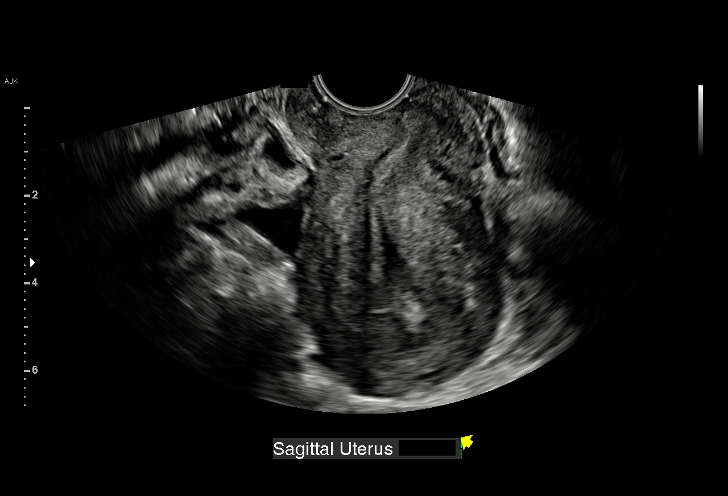
[im 30/58]
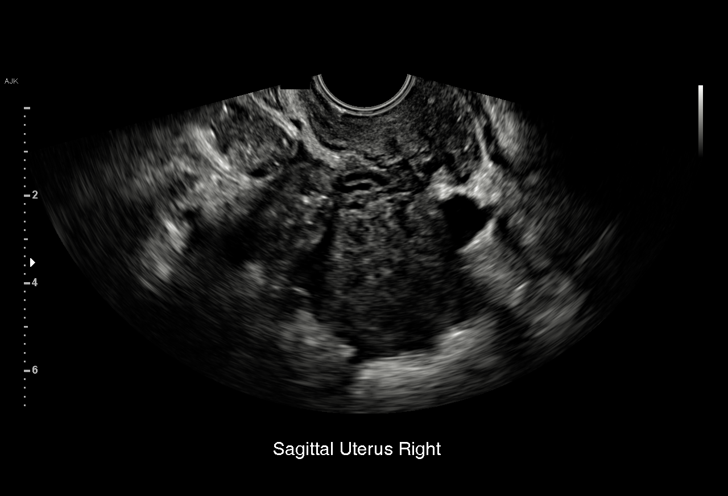
[im 32/58]
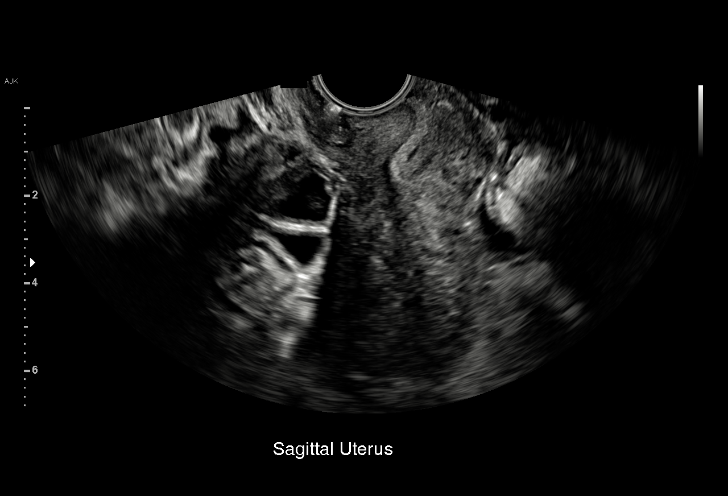
[im 36/58]
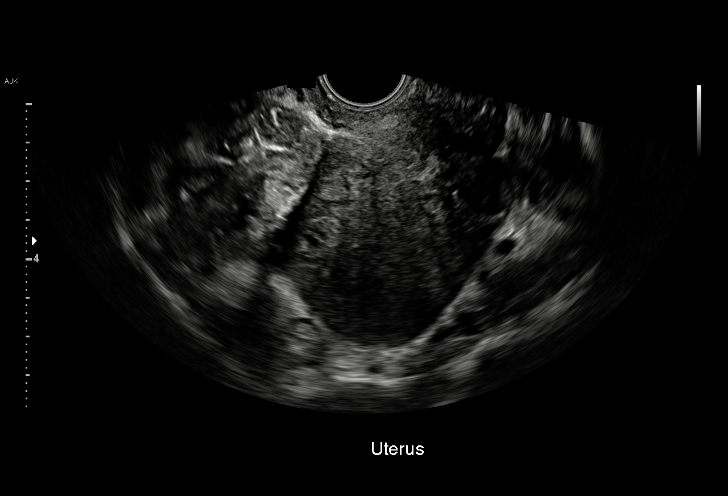
[im 41/58]
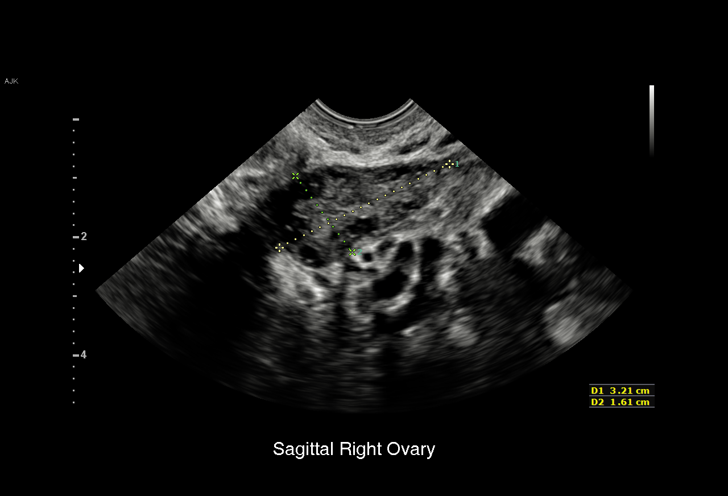
[im 45/58]
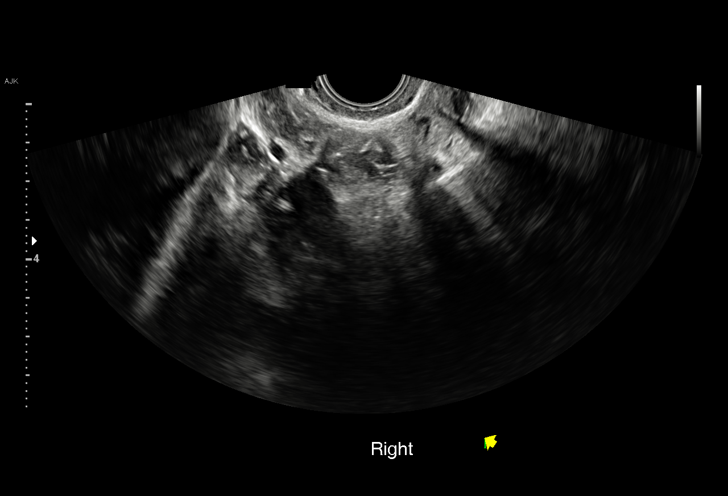
[im 49/58]
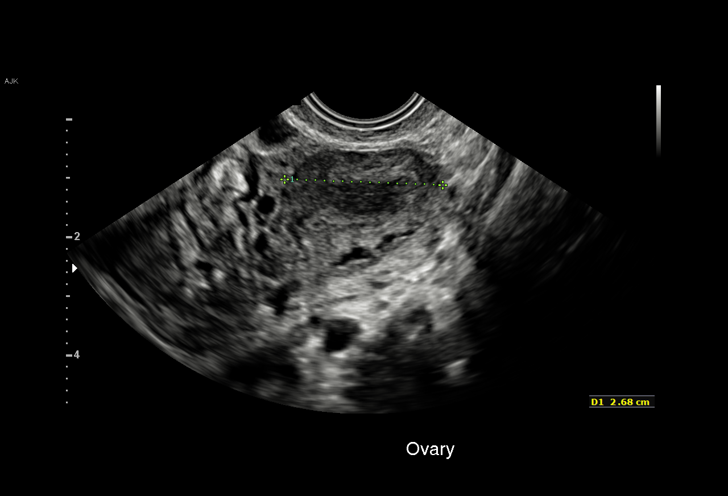
[im 53/58]
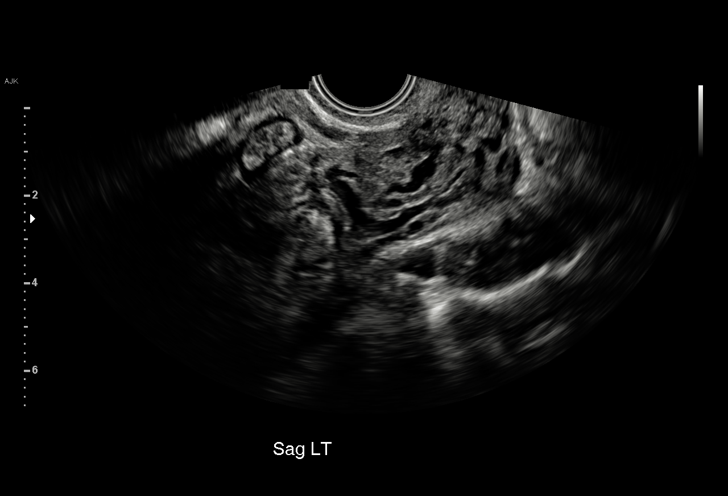
[im 58/58]
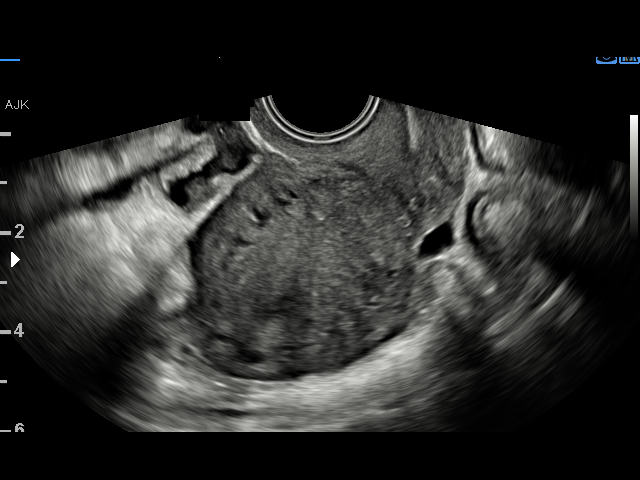

[15 of 28 positions shown; findings below may reference images not displayed]

FINDINGS: Intrauterine gestational sac: None

Yolk sac:  Not Visualized.

Embryo:  Not Visualized.

Maternal uterus/adnexae: No other fluid collections seen within the
endometrial cavity. Endometrial thickness 8 mm. Small left ovarian
corpus luteum noted. Normal appearance of right ovary. No evidence
of adnexal mass or abnormal free fluid.
IMPRESSION: No intrauterine gestational sac or adnexal mass identified,
consistent with pregnancy of unknown location. Differential
diagnosis includes recent spontaneous abortion, IUP too early to
visualize, and non-visualized ectopic pregnancy. Recommend close
follow up of quantitative B-HCG levels, and follow up US as
clinically warranted.

## 2018-11-14 ENCOUNTER — Encounter (HOSPITAL_COMMUNITY): Payer: Self-pay

## 2018-11-14 ENCOUNTER — Emergency Department (HOSPITAL_COMMUNITY)
Admission: EM | Admit: 2018-11-14 | Discharge: 2018-11-14 | Disposition: A | Discharge status: Home / Self Care | Payer: Medicaid Other | Attending: Emergency Medicine | Admitting: Emergency Medicine

## 2018-11-14 ENCOUNTER — Other Ambulatory Visit: Payer: Self-pay

## 2018-11-14 DIAGNOSIS — Z79899 Other long term (current) drug therapy: Secondary | ICD-10-CM | POA: Diagnosis not present

## 2018-11-14 DIAGNOSIS — R45851 Suicidal ideations: Secondary | ICD-10-CM | POA: Insufficient documentation

## 2018-11-14 DIAGNOSIS — F32A Depression, unspecified: Secondary | ICD-10-CM

## 2018-11-14 DIAGNOSIS — F329 Major depressive disorder, single episode, unspecified: Secondary | ICD-10-CM | POA: Diagnosis not present

## 2018-11-14 DIAGNOSIS — Z87891 Personal history of nicotine dependence: Secondary | ICD-10-CM | POA: Insufficient documentation

## 2018-11-14 LAB — CBC
HCT: 42.1 % (ref 36.0–46.0)
Hemoglobin: 14.1 g/dL (ref 12.0–15.0)
MCH: 31.8 pg (ref 26.0–34.0)
MCHC: 33.5 g/dL (ref 30.0–36.0)
MCV: 95 fL (ref 80.0–100.0)
Platelets: 140 10*3/uL — ABNORMAL LOW (ref 150–400)
RBC: 4.43 MIL/uL (ref 3.87–5.11)
RDW: 11.7 % (ref 11.5–15.5)
WBC: 6.7 10*3/uL (ref 4.0–10.5)
nRBC: 0 % (ref 0.0–0.2)

## 2018-11-14 LAB — I-STAT BETA HCG BLOOD, ED (MC, WL, AP ONLY): I-stat hCG, quantitative: <5 m[IU]/mL (ref <5)

## 2018-11-14 LAB — SALICYLATE LEVEL: Salicylate Lvl: <7 mg/dL (ref 2.8–30.0)

## 2018-11-14 LAB — COMPREHENSIVE METABOLIC PANEL
ALT: 13 U/L (ref 0–44)
AST: 18 U/L (ref 15–41)
Albumin: 4.4 g/dL (ref 3.5–5.0)
Alkaline Phosphatase: 53 U/L (ref 38–126)
Anion gap: 7 (ref 5–15)
BUN: 9 mg/dL (ref 6–20)
CO2: 24 mmol/L (ref 22–32)
Calcium: 9.1 mg/dL (ref 8.9–10.3)
Chloride: 108 mmol/L (ref 98–111)
Creatinine, Ser: 0.87 mg/dL (ref 0.44–1.00)
GFR calc Af Amer: >60 mL/min (ref >60)
GFR calc non Af Amer: >60 mL/min (ref >60)
Glucose, Bld: 108 mg/dL — ABNORMAL HIGH (ref 70–99)
Potassium: 3.5 mmol/L (ref 3.5–5.1)
Sodium: 139 mmol/L (ref 135–145)
Total Bilirubin: 0.7 mg/dL (ref 0.3–1.2)
Total Protein: 7.7 g/dL (ref 6.5–8.1)

## 2018-11-14 LAB — RAPID URINE DRUG SCREEN, HOSP PERFORMED
Amphetamines: NOT DETECTED
Barbiturates: NOT DETECTED
Benzodiazepines: NOT DETECTED
Cocaine: NOT DETECTED
Opiates: NOT DETECTED
Tetrahydrocannabinol: POSITIVE — AB

## 2018-11-14 LAB — ACETAMINOPHEN LEVEL: Acetaminophen (Tylenol), Serum: <10 ug/mL — ABNORMAL LOW (ref 10–30)

## 2018-11-14 LAB — ETHANOL: Alcohol, Ethyl (B): <10 mg/dL (ref <10)

## 2018-11-14 NOTE — ED Notes (Signed)
Pt has 2 pt belonging bags in cabinet behind nursing station.

## 2018-11-14 NOTE — ED Notes (Addendum)
Pt verbalized discharge instructions and follow up care. Alert and ambulatory. No IV. Family is picking pt up  

## 2018-11-14 NOTE — Discharge Instructions (Addendum)
Follow-up with your outpatient counselors as recommended by TTS representatives.  Return to the emergency department if your symptoms significantly worsen or change.

## 2018-11-14 NOTE — ED Provider Notes (Signed)
[facility] DEPT Provider Note   CSN: [id number] Arrival date & time: 11/14/18  1435     History   Chief Complaint Chief Complaint  Patient presents with  . Suicidal    HPI Morgan Galloway is a 28 y.o. female.     Patient is a 28 year old female with past medical history of depression.  She presents today for evaluation of depression suicidal ideation.  Patient reports feeling overwhelmed by her current home situation.  She states that she has mood swings that come and go at random.  She does not feel as though her medications are helping.  She denies any suicidal plan.  She denies any homicidal ideation.  She denies drug or alcohol use.  The history is provided by the patient.    Past Medical History:  Diagnosis Date  . Depression    Zoloft in past; stopped due to potential of pregnancy.  . Gonorrhea   . Headache   . History of stillbirth    June 2012  . Infection    UTI  . Vaginal Pap smear, abnormal    f/u ok    Patient Active Problem List   Diagnosis Date Noted  . Vaginal delivery 05/07/2017    Past Surgical History:  Procedure Laterality Date  . NO PAST SURGERIES       OB History    Gravida  6   Para  2   Term  2   Preterm  0   AB  4   Living  2     SAB  3   TAB      Ectopic      Multiple  0   Live Births  2            Home Medications    Prior to Admission medications   Medication Sig Start Date End Date Taking? Authorizing Provider  calcium carbonate (TUMS - DOSED IN MG ELEMENTAL CALCIUM) 500 MG chewable tablet Chew 2 tablets by mouth daily as needed for indigestion or heartburn.    [provider]  ibuprofen (ADVIL,MOTRIN) 600 MG tablet Take 1 tablet (600 mg total) by mouth every 6 (six) hours as needed. 05/08/17   Donnel Saxon, CNM  Prenatal Vit-Fe Fumarate-FA (MULTIVITAMIN-PRENATAL) 27-0.8 MG TABS tablet Take 1 tablet by mouth daily at 12 noon.    [provider]    Family  History Family History  Problem Relation Age of Onset  . Cancer Maternal Grandmother   . Diabetes Maternal Grandmother   . Stroke Father 62  . Hypertension Father   . Bipolar disorder Mother   . Bipolar disorder Sister   . Other Neg Hx     Social History Social History   Tobacco Use  . Smoking status: Former Smoker    Packs/day: 0.50    Types: Cigarettes    Quit date: 08/20/2016    Years since quitting: 2.2  . Smokeless tobacco: Never Used  . Tobacco comment: quit with preg  Substance Use Topics  . Alcohol use: Yes    Comment: occasionally  . Drug use: Yes    Types: Marijuana    Comment: last use 11/03/18     Allergies   Patient has no known allergies.   Review of Systems Review of Systems  All other systems reviewed and are negative.    Physical Exam Updated Vital Signs BP 134/66   Pulse 71   Temp 98.4 F (36.9 C) (Oral)   Resp  16   Ht 5\' 4"  (1.626 m)   Wt 64.4 kg   SpO2 100%   BMI 24.37 kg/m   Physical Exam Vitals signs and nursing note reviewed.  Constitutional:      General: She is not in acute distress.    Appearance: She is well-developed. She is not diaphoretic.  HENT:     Head: Normocephalic and atraumatic.  Neck:     Musculoskeletal: Normal range of motion and neck supple.  Cardiovascular:     Rate and Rhythm: Normal rate and regular rhythm.     Heart sounds: No murmur. No friction rub. No gallop.   Pulmonary:     Effort: Pulmonary effort is normal. No respiratory distress.     Breath sounds: Normal breath sounds. No wheezing.  Abdominal:     General: Bowel sounds are normal. There is no distension.     Palpations: Abdomen is soft.     Tenderness: There is no abdominal tenderness.  Musculoskeletal: Normal range of motion.  Skin:    General: Skin is warm and dry.  Neurological:     Mental Status: She is alert and oriented to person, place, and time.  Psychiatric:        Attention and Perception: Attention and perception normal.         Mood and Affect: Mood is depressed.        Speech: Speech normal.        Behavior: Behavior normal.        Thought Content: Thought content normal.      ED Treatments / Results  Labs (all labs ordered are listed, but only abnormal results are displayed) Labs Reviewed  CBC - Abnormal; Notable for the following components:      Result Value   Platelets 140 (*)    All other components within normal limits  COMPREHENSIVE METABOLIC PANEL  ETHANOL  SALICYLATE LEVEL  ACETAMINOPHEN LEVEL  RAPID URINE DRUG SCREEN, HOSP PERFORMED  I-STAT BETA HCG BLOOD, ED ([facility], [facility], [facility] ONLY)    EKG None  Radiology No results found.  Procedures Procedures (including critical care time)  Medications Ordered in ED Medications - No data to display   Initial Impression / Assessment and Plan / ED Course  I have reviewed the triage vital signs and the nursing notes.  Pertinent labs & imaging results that were available during my care of the patient were reviewed by me and considered in my medical decision making (see chart for details).  Patient presenting here with complaints of depression and feeling overwhelmed by family situation.  Patient is medically cleared from my standpoint.  She was seen by TTS and not felt to meet inpatient criteria.  Patient to follow-up with her outpatient counselors and return to the ER if her situation worsens.  Final Clinical Impressions(s) / ED Diagnoses   Final diagnoses:  None    ED Discharge Orders    None       Geoffery Lyonselo, Soren Lazarz, MD 11/14/18 2137

## 2018-11-14 NOTE — BH Assessment (Signed)
Tele Assessment Note   Patient Name: Morgan Galloway MRN: [id number] Referring Physician: Veryl Speak, MD Location of Patient: Morgan Galloway Location of Provider: [place] Department  [name] is a married 28 y.o. female who presents voluntarily to [facility] reporting symptoms of depression and suicidal ideation. Pt has a history of Bipolar dx and says s/he was referred for assessment by [facility]. Pt reports medication is prescribed by Eino Farber, NP.  Pt's therapist is Claudette at [facility]. Pt last spoke with Claudette in June 2020. Pt reports current suicidal ideation with no plans to harm herself. Pt describes a feeling at least once daily of "I don't want to be here". She states she does not think of how she would end her life. Pt does report a recent dream where she gave the children her medications and then she overdosed.  Pt states when she has thoughts of not wanting to be here, she knows she could not hurt her mother, father & kids by hurting herself. Pt denies past attempts. Pt acknowledges multiple symptoms of Depression. Pt denies homicidal ideation/ history of violence. Pt denies AVH & other sx of psychosis. Pt states current stressors include conflict with her spouse over several issues, but mostly care of children and home.   Pt lives with her spouse & 2 young children, and supports include her parents and a good friend. Pt denies hx of abuse and trauma. Pt reports no known family history of substance abuse of mental illness. 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 Pt has good insight and judgment. Pt's memory is intact. Legal history includes no charges. ? IP history includes no admissions. Pt reports occasional alcohol use. Pt was smoking THC daily until July 12. After weaning down, pt stopped smoking after caution from Eino Farber, NP. Pt agreeable to return to Eino Farber but states she knows the [facility] is still in her system.  Pt states she feels safe to return home &  agrees to follow up with her outpt providers. She gave permission to gain collateral information from her mother. By phone mother states she feels safe with pt returning home as well. Both pt and her mother were advised to return to ED, come directly to [facility] for assessment or contact mobile crisis again as options for getting help.  ? MSE: Pt is casually dressed, alert, oriented x4 with normal speech and normal motor behavior. Eye contact is good. Pt's mood is depressed and irritable. Affect is constricted and irritable. Affect is congruent with mood. Thought process is coherent and relevant. There is no indication Pt is currently responding to internal stimuli or experiencing delusional thought content. Pt was cooperative throughout assessment.     Diagnosis: Bipolar,depressed Disposition: Jinny Blossom recommends d/c from ED & pt follow up with her outpt providers   Past Medical History:  Past Medical History:  Diagnosis Date  . Depression    Zoloft in past; stopped due to potential of pregnancy.  . Gonorrhea   . Headache   . History of stillbirth    June 2012  . Infection    UTI  . Vaginal Pap smear, abnormal    f/u ok    Past Surgical History:  Procedure Laterality Date  . NO PAST SURGERIES      Family History:  Family History  Problem Relation Age of Onset  . Cancer Maternal Grandmother   . Diabetes Maternal Grandmother   . Stroke Father 19  . Hypertension Father   .  Bipolar disorder Mother   . Bipolar disorder Sister   . Other Neg Hx     Social History:  reports that she quit smoking about 2 years ago. Her smoking use included cigarettes. She smoked 0.50 packs per day. She has never used smokeless tobacco. She reports current alcohol use. She reports current drug use. Drug: Marijuana.  Additional Social History:  Alcohol / Drug Use Pain Medications: See MAR Prescriptions: See MAR Over the Counter: See MAR History of alcohol / drug use?: Yes Substance #1 Name of  Substance 1: THC 1 - Age of First Use: 13 1 - Last Use / Amount: 11/03/2018 Substance #2 Name of Substance 2: alcohol 2 - Age of First Use: 17 2 - Frequency: weekends 2 - Last Use / Amount: 11/09/18  CIWA: CIWA-Ar BP: 134/66 Pulse Rate: 71 COWS:    Allergies: No Known Allergies  Home Medications: (Not in a hospital admission)   OB/GYN Status:  No LMP recorded. Patient has had an injection.  General Assessment Data Location of Assessment: [facility] ED TTS Assessment: In system Is this a Tele or Face-to-Face Assessment?: Tele Assessment Is this an Initial Assessment or a Re-assessment for this encounter?: Initial Assessment Patient Accompanied by:: N/A Language Other than English: No Living Arrangements: Other (Comment) What gender do you identify as?: Female Marital status: Married ArtistMaiden name: Edmendson Living Arrangements: Children, Spouse/significant other, Parent Can pt return to current living arrangement?: Yes Admission Status: Voluntary Is patient capable of signing voluntary admission?: Yes Referral Source: Marketing executive([facility] suggested ) Insurance type: medicaid     Crisis Care Plan Living Arrangements: Children, Spouse/significant other, Parent Name of Psychiatrist: Tamela OddiJo Hughes, [place] Name of Therapist: Claudette - [facility](spoke over phone in June)  Education Status Is patient currently in school?: No Is the patient employed, 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 : No Suicidal Intent: No Has patient had any suicidal intent within the past 6 months prior to admission? : No Is patient at risk for suicide?: Yes Suicidal Plan?: No Has patient had any suicidal plan within the past 6 months prior to admission? : No What has been your use of drugs/alcohol within the last 12 months?: used to use thc  daily, but quit 11/03/18 Previous Attempts/Gestures: No How many times?: 0 Intentional Self Injurious Behavior: None Family Suicide History: Unknown Recent stressful life event(s): Turmoil (Comment), Financial Problems, Conflict (Comment)(marital conflict) Persecutory voices/beliefs?: No Depression: Yes Depression Symptoms: Despondent, Insomnia, Tearfulness, Isolating, Fatigue, Guilt, Loss of interest in usual pleasures, Feeling angry/irritable Substance abuse history and/or treatment for substance abuse?: Yes Suicide prevention information given to non-admitted patients: Yes  Risk to Others within the past 6 months Homicidal Ideation: No Does patient have any lifetime risk of violence toward others beyond the six months prior to admission? : No Thoughts of Harm to Others: No Current Homicidal Intent: No Current Homicidal Plan: No History of harm to others?: No Assessment of Violence: None Noted Does patient have access to weapons?: No Criminal Charges Pending?: No Does patient have a court date: No Is patient on probation?: No  Psychosis Hallucinations: None noted Delusions: None noted  Mental Status Report Appearance/Hygiene: Unremarkable Eye Contact: Good Motor Activity: Freedom of movement Speech: Logical/coherent Level of Consciousness: Alert Mood: Depressed, Irritable Affect: Constricted, Depressed Anxiety Level: Minimal Thought Processes: Relevant, Coherent Judgement: Partial Orientation:  Person, Place, Time, Situation Obsessive Compulsive Thoughts/Behaviors: None  Cognitive Functioning Concentration: Normal Memory: Recent Intact, Remote Intact Is patient IDD: No Insight: Good Impulse Control: Good Sleep: Decreased Total Hours of Sleep: 6 Vegetative Symptoms: None  ADLScreening [facility] Services) Patient's cognitive ability adequate to safely complete daily activities?: Yes Patient able to express need for assistance with ADLs?: Yes Independently  performs ADLs?: Yes (appropriate for developmental age)  Prior Inpatient Therapy Prior Inpatient Therapy: No  Prior Outpatient Therapy Prior Outpatient Therapy: Yes Prior Therapy Dates: ongoing Prior Therapy Facilty/Provider(s): Tamela OddiJo Hughes, NP Reason for Treatment: Depression, SI Does patient have an ACCT team?: No Does patient have Intensive In-House Services?  : No Does patient have [facility] services? : No Does patient have P4CC services?: No  ADL Screening (condition at time of admission) Patient's cognitive ability adequate to safely complete daily activities?: Yes Is the patient deaf or have difficulty hearing?: No Does the patient have difficulty seeing, even when wearing glasses/contacts?: No Does the patient have difficulty concentrating, remembering, or making decisions?: No Patient able to express need for assistance with ADLs?: Yes Does the patient have difficulty dressing or bathing?: No Independently performs ADLs?: Yes (appropriate for developmental age) Does the patient have difficulty walking or climbing stairs?: No Weakness of Legs: None Weakness of Arms/Hands: None  Home Assistive Devices/Equipment Home Assistive Devices/Equipment: None  Therapy Consults (therapy consults require a physician order) PT Evaluation Needed: No OT Evalulation Needed: No SLP Evaluation Needed: No Abuse/Neglect Assessment (Assessment to be complete while patient is alone) Abuse/Neglect Assessment Can Be Completed: Yes Physical Abuse: Denies Verbal Abuse: Denies Sexual Abuse: Denies Exploitation of patient/patient's resources: Denies Self-Neglect: Denies Values / Beliefs Cultural Requests During Hospitalization: None Spiritual Requests During Hospitalization: None Consults Spiritual Care Consult Needed: No Social Work Consult Needed: No Merchant navy officerAdvance Directives (For Healthcare) Does Patient Have a Medical Advance Directive?: No Would patient like information on creating a medical  advance directive?: No - Patient declined          Disposition: Elta GuadeloupeLaurie Parks recommends d/c from ED & pt follow up with her outpt providers Disposition Initial Assessment Completed for this Encounter: Yes  This service was provided via telemedicine using a 2-way, interactive audio and video technology.   Latanga Nedrow Suzan NailerH Merlie Noga 11/14/2018 7:35 PM

## 2018-11-14 NOTE — ED Notes (Signed)
Pt provided dinner tray.

## 2018-11-14 NOTE — ED Triage Notes (Signed)
Pt states that she had a breakdown today. Pt states that she has hx of depression and takes meds. Pt states she had a dream of how she would do it, but doesn't know if she could actually follow through in real life.

## 2020-02-02 ENCOUNTER — Telehealth: Payer: Medicaid Other | Admitting: Emergency Medicine

## 2020-02-02 DIAGNOSIS — R300 Dysuria: Secondary | ICD-10-CM

## 2020-02-02 MED ORDER — NITROFURANTOIN MONOHYD MACRO 100 MG PO CAPS: 100.0000 mg | ORAL_CAPSULE | Freq: Two times a day (BID) | ORAL | 0 refills | Status: DC

## 2020-02-02 NOTE — Progress Notes (Signed)
We are sorry that you are not feeling well.  Here is how we plan to help!  Based on what you shared with me it looks like you most likely have a simple urinary tract infection.  A UTI (Urinary Tract Infection) is a bacterial infection of the bladder.  Most cases of urinary tract infections are simple to treat but a key part of your care is to encourage you to drink plenty of fluids and watch your symptoms carefully.  I have prescribed MacroBid 100 mg twice a day for 5 days.  Your symptoms should gradually improve. Call us if the burning in your urine worsens, you develop worsening fever, back pain or pelvic pain or if your symptoms do not resolve after completing the antibiotic.  Urinary tract infections can be prevented by drinking plenty of water to keep your body hydrated.  Also be sure when you wipe, wipe from front to back and don't hold it in!  If possible, empty your bladder every 4 hours.  Your e-visit answers were reviewed by a board certified advanced clinical practitioner to complete your personal care plan.  Depending on the condition, your plan could have included both over the counter or prescription medications.  If there is a problem please reply  once you have received a response from your provider.  Your safety is important to us.  If you have drug allergies check your prescription carefully.    You can use MyChart to ask questions about today's visit, request a non-urgent call back, or ask for a work or school excuse for 24 hours related to this e-Visit. If it has been greater than 24 hours you will need to follow up with your provider, or enter a new e-Visit to address those concerns.   You will get an e-mail in the next two days asking about your experience.  I hope that your e-visit has been valuable and will speed your recovery. Thank you for using e-visits.   **Please do not respond to this message unless you have follow up questions.** Greater than 5 but less than 10  minutes spent researching, coordinating, and implementing care for this patient today  

## 2020-02-11 ENCOUNTER — Ambulatory Visit (INDEPENDENT_AMBULATORY_CARE_PROVIDER_SITE_OTHER): Payer: Medicaid Other

## 2020-02-11 DIAGNOSIS — Z23 Encounter for immunization: Secondary | ICD-10-CM

## 2020-02-12 ENCOUNTER — Telehealth: Payer: Medicaid Other | Admitting: Emergency Medicine

## 2020-02-12 DIAGNOSIS — N3000 Acute cystitis without hematuria: Secondary | ICD-10-CM | POA: Diagnosis not present

## 2020-02-12 MED ORDER — FLUCONAZOLE 150 MG PO TABS: 150.0000 mg | ORAL_TABLET | Freq: Once | ORAL | 0 refills | Status: AC

## 2020-02-12 MED ORDER — CEPHALEXIN 500 MG PO CAPS: 500.0000 mg | ORAL_CAPSULE | Freq: Two times a day (BID) | ORAL | 0 refills | Status: AC

## 2020-02-12 NOTE — Progress Notes (Signed)
We are sorry that you are not feeling well.  Here is how we plan to help! We will plan to try a different antibiotic to see if that is more effective. If you are still having symptoms after finishing this antibiotic, you will need to see a doctor in person for further evaluation.   Based on what you shared with me it looks like you most likely have a simple urinary tract infection.  A UTI (Urinary Tract Infection) is a bacterial infection of the bladder.  Most cases of urinary tract infections are simple to treat but a key part of your care is to encourage you to drink plenty of fluids and watch your symptoms carefully.  I have prescribed Keflex 500 mg twice a day for 7 days.  Your symptoms should gradually improve. Call us if the burning in your urine worsens, you develop worsening fever, back pain or pelvic pain or if your symptoms do not resolve after completing the antibiotic.  I have also provided a diflucan pill that you can take after you finish your antibiotic to prevent yeast infection, particularly since this will be your second antibiotic.   Urinary tract infections can be prevented by drinking plenty of water to keep your body hydrated.  Also be sure when you wipe, wipe from front to back and don't hold it in!  If possible, empty your bladder every 4 hours.  Your e-visit answers were reviewed by a board certified advanced clinical practitioner to complete your personal care plan.  Depending on the condition, your plan could have included both over the counter or prescription medications.  If there is a problem please reply  once you have received a response from your provider.  Your safety is important to [place].  If you have drug allergies check your prescription carefully.    You can use MyChart to ask questions about today's visit, request a non-urgent call back, or ask for a work or school excuse for 24 hours related to this e-Visit. If it has been greater than 24 hours you will need  to follow up with your provider, or enter a new e-Visit to address those concerns.   You will get an e-mail in the next two days asking about your experience.  I hope that your e-visit has been valuable and will speed your recovery. Thank you for using e-visits.  I spent 5-10 minutes reviewing patient's chart and medical information.

## 2020-03-01 ENCOUNTER — Other Ambulatory Visit: Payer: Self-pay

## 2020-03-01 ENCOUNTER — Encounter (HOSPITAL_COMMUNITY): Payer: Self-pay | Admitting: Emergency Medicine

## 2020-03-01 ENCOUNTER — Inpatient Hospital Stay (HOSPITAL_COMMUNITY)
Admission: EM | Admit: 2020-03-01 | Discharge: 2020-03-04 | DRG: 690 | Disposition: A | Discharge status: Home / Self Care | Payer: Medicaid Other | Attending: Student in an Organized Health Care Education/Training Program | Admitting: Student in an Organized Health Care Education/Training Program

## 2020-03-01 DIAGNOSIS — Z79899 Other long term (current) drug therapy: Secondary | ICD-10-CM

## 2020-03-01 DIAGNOSIS — Z823 Family history of stroke: Secondary | ICD-10-CM

## 2020-03-01 DIAGNOSIS — N10 Acute tubulo-interstitial nephritis: Principal | ICD-10-CM | POA: Diagnosis present

## 2020-03-01 DIAGNOSIS — Z1611 Resistance to penicillins: Secondary | ICD-10-CM | POA: Diagnosis present

## 2020-03-01 DIAGNOSIS — Z803 Family history of malignant neoplasm of breast: Secondary | ICD-10-CM

## 2020-03-01 DIAGNOSIS — F17200 Nicotine dependence, unspecified, uncomplicated: Secondary | ICD-10-CM | POA: Diagnosis present

## 2020-03-01 DIAGNOSIS — Z833 Family history of diabetes mellitus: Secondary | ICD-10-CM

## 2020-03-01 DIAGNOSIS — Z20822 Contact with and (suspected) exposure to covid-19: Secondary | ICD-10-CM | POA: Diagnosis present

## 2020-03-01 DIAGNOSIS — F319 Bipolar disorder, unspecified: Secondary | ICD-10-CM | POA: Diagnosis present

## 2020-03-01 DIAGNOSIS — A419 Sepsis, unspecified organism: Secondary | ICD-10-CM

## 2020-03-01 DIAGNOSIS — N12 Tubulo-interstitial nephritis, not specified as acute or chronic: Secondary | ICD-10-CM | POA: Diagnosis present

## 2020-03-01 DIAGNOSIS — R3911 Hesitancy of micturition: Secondary | ICD-10-CM | POA: Diagnosis present

## 2020-03-01 DIAGNOSIS — E876 Hypokalemia: Secondary | ICD-10-CM | POA: Diagnosis present

## 2020-03-01 DIAGNOSIS — Z8249 Family history of ischemic heart disease and other diseases of the circulatory system: Secondary | ICD-10-CM

## 2020-03-01 DIAGNOSIS — Z818 Family history of other mental and behavioral disorders: Secondary | ICD-10-CM

## 2020-03-01 DIAGNOSIS — F1290 Cannabis use, unspecified, uncomplicated: Secondary | ICD-10-CM | POA: Diagnosis present

## 2020-03-01 LAB — COMPREHENSIVE METABOLIC PANEL
ALT: 15 U/L (ref 0–44)
AST: 19 U/L (ref 15–41)
Albumin: 3.7 g/dL (ref 3.5–5.0)
Alkaline Phosphatase: 65 U/L (ref 38–126)
Anion gap: 12 (ref 5–15)
BUN: 9 mg/dL (ref 6–20)
CO2: 22 mmol/L (ref 22–32)
Calcium: 9.1 mg/dL (ref 8.9–10.3)
Chloride: 99 mmol/L (ref 98–111)
Creatinine, Ser: 0.91 mg/dL (ref 0.44–1.00)
GFR, Estimated: >60 mL/min (ref >60)
Glucose, Bld: 101 mg/dL — ABNORMAL HIGH (ref 70–99)
Potassium: 4 mmol/L (ref 3.5–5.1)
Sodium: 133 mmol/L — ABNORMAL LOW (ref 135–145)
Total Bilirubin: 0.9 mg/dL (ref 0.3–1.2)
Total Protein: 8.2 g/dL — ABNORMAL HIGH (ref 6.5–8.1)

## 2020-03-01 LAB — CBC
HCT: 41.9 % (ref 36.0–46.0)
Hemoglobin: 13.7 g/dL (ref 12.0–15.0)
MCH: 30.6 pg (ref 26.0–34.0)
MCHC: 32.7 g/dL (ref 30.0–36.0)
MCV: 93.5 fL (ref 80.0–100.0)
Platelets: 154 10*3/uL (ref 150–400)
RBC: 4.48 MIL/uL (ref 3.87–5.11)
RDW: 12.1 % (ref 11.5–15.5)
WBC: 13.3 10*3/uL — ABNORMAL HIGH (ref 4.0–10.5)
nRBC: 0 % (ref 0.0–0.2)

## 2020-03-01 LAB — URINALYSIS, ROUTINE W REFLEX MICROSCOPIC
Bilirubin Urine: NEGATIVE
Glucose, UA: NEGATIVE mg/dL
Ketones, ur: 20 mg/dL — AB
Nitrite: POSITIVE — AB
Protein, ur: 100 mg/dL — AB
Specific Gravity, Urine: 1.024 (ref 1.005–1.030)
WBC, UA: >50 WBC/hpf — ABNORMAL HIGH (ref 0–5)
pH: 6 (ref 5.0–8.0)

## 2020-03-01 LAB — LIPASE, BLOOD: Lipase: 19 U/L (ref 11–51)

## 2020-03-01 MED ORDER — OXYCODONE-ACETAMINOPHEN 5-325 MG PO TABS
1.0000 | ORAL_TABLET | Freq: Once | ORAL | Status: AC
  Administered 2020-03-01: 1 via ORAL
  Filled 2020-03-01: qty 1

## 2020-03-01 MED ORDER — HYDROCODONE-ACETAMINOPHEN 7.5-325 MG/15ML PO SOLN: 10.0000 mL | Freq: Once | ORAL | Status: DC

## 2020-03-01 MED ORDER — ONDANSETRON 4 MG PO TBDP
4.0000 mg | ORAL_TABLET | Freq: Once | ORAL | Status: AC
  Administered 2020-03-01: 4 mg via ORAL
  Filled 2020-03-01: qty 1

## 2020-03-01 NOTE — ED Triage Notes (Signed)
Pt. Stated, Morgan Galloway had a UTI for a month and Ive been on antibiotics, this is my 3rd round of antibiotics. Today I just got Sulfameth/Trimethoprim 800/160mg . The last one was oct. 21. Today I started the vomiting with night sweats and lower back pain started Friday.

## 2020-03-01 NOTE — ED Triage Notes (Signed)
COVID test today [facility] and it was neg.

## 2020-03-02 ENCOUNTER — Other Ambulatory Visit: Payer: Self-pay

## 2020-03-02 ENCOUNTER — Emergency Department (HOSPITAL_COMMUNITY): Payer: Medicaid Other

## 2020-03-02 DIAGNOSIS — E876 Hypokalemia: Secondary | ICD-10-CM | POA: Diagnosis not present

## 2020-03-02 DIAGNOSIS — R509 Fever, unspecified: Secondary | ICD-10-CM | POA: Diagnosis not present

## 2020-03-02 DIAGNOSIS — N12 Tubulo-interstitial nephritis, not specified as acute or chronic: Secondary | ICD-10-CM | POA: Diagnosis not present

## 2020-03-02 LAB — CBC WITH DIFFERENTIAL/PLATELET
Abs Immature Granulocytes: 0.06 10*3/uL (ref 0.00–0.07)
Basophils Absolute: 0 10*3/uL (ref 0.0–0.1)
Basophils Relative: 0 %
Eosinophils Absolute: 0 10*3/uL (ref 0.0–0.5)
Eosinophils Relative: 0 %
HCT: 39.8 % (ref 36.0–46.0)
Hemoglobin: 13 g/dL (ref 12.0–15.0)
Immature Granulocytes: 0 %
Lymphocytes Relative: 10 %
Lymphs Abs: 1.4 10*3/uL (ref 0.7–4.0)
MCH: 30.2 pg (ref 26.0–34.0)
MCHC: 32.7 g/dL (ref 30.0–36.0)
MCV: 92.6 fL (ref 80.0–100.0)
Monocytes Absolute: 2.2 10*3/uL — ABNORMAL HIGH (ref 0.1–1.0)
Monocytes Relative: 16 %
Neutro Abs: 10.4 10*3/uL — ABNORMAL HIGH (ref 1.7–7.7)
Neutrophils Relative %: 74 %
Platelets: 144 10*3/uL — ABNORMAL LOW (ref 150–400)
RBC: 4.3 MIL/uL (ref 3.87–5.11)
RDW: 12.2 % (ref 11.5–15.5)
WBC: 14.1 10*3/uL — ABNORMAL HIGH (ref 4.0–10.5)
nRBC: 0 % (ref 0.0–0.2)

## 2020-03-02 LAB — RESPIRATORY PANEL BY RT PCR (FLU A&B, COVID)
Influenza A by PCR: NEGATIVE
Influenza B by PCR: NEGATIVE
SARS Coronavirus 2 by RT PCR: NEGATIVE

## 2020-03-02 LAB — I-STAT BETA HCG BLOOD, ED (MC, WL, AP ONLY): I-stat hCG, quantitative: <5 m[IU]/mL (ref <5)

## 2020-03-02 LAB — BASIC METABOLIC PANEL
Anion gap: 12 (ref 5–15)
BUN: 7 mg/dL (ref 6–20)
CO2: 22 mmol/L (ref 22–32)
Calcium: 8.8 mg/dL — ABNORMAL LOW (ref 8.9–10.3)
Chloride: 103 mmol/L (ref 98–111)
Creatinine, Ser: 0.98 mg/dL (ref 0.44–1.00)
GFR, Estimated: >60 mL/min (ref >60)
Glucose, Bld: 104 mg/dL — ABNORMAL HIGH (ref 70–99)
Potassium: 3.3 mmol/L — ABNORMAL LOW (ref 3.5–5.1)
Sodium: 137 mmol/L (ref 135–145)

## 2020-03-02 LAB — LACTIC ACID, PLASMA: Lactic Acid, Venous: 1.3 mmol/L (ref 0.5–1.9)

## 2020-03-02 LAB — HIV ANTIBODY (ROUTINE TESTING W REFLEX): HIV Screen 4th Generation wRfx: NONREACTIVE

## 2020-03-02 MED ORDER — OXYCODONE HCL 5 MG PO TABS
5.0000 mg | ORAL_TABLET | Freq: Once | ORAL | Status: AC
  Administered 2020-03-02: 5 mg via ORAL
  Filled 2020-03-02: qty 1

## 2020-03-02 MED ORDER — DIVALPROEX SODIUM ER 500 MG PO TB24
500.0000 mg | ORAL_TABLET | Freq: Every day | ORAL | Status: DC
  Administered 2020-03-02 – 2020-03-04 (×3): 500 mg via ORAL
  Filled 2020-03-02 (×3): qty 1

## 2020-03-02 MED ORDER — SODIUM CHLORIDE 0.9 % IV BOLUS
1000.0000 mL | Freq: Once | INTRAVENOUS | Status: AC
  Administered 2020-03-02: 1000 mL via INTRAVENOUS

## 2020-03-02 MED ORDER — HYDROXYZINE HCL 10 MG PO TABS
10.0000 mg | ORAL_TABLET | Freq: Four times a day (QID) | ORAL | Status: DC | PRN
  Administered 2020-03-02: 10 mg via ORAL
  Filled 2020-03-02: qty 1

## 2020-03-02 MED ORDER — ACETAMINOPHEN 650 MG RE SUPP: 650.0000 mg | Freq: Four times a day (QID) | RECTAL | Status: DC | PRN

## 2020-03-02 MED ORDER — POLYETHYLENE GLYCOL 3350 17 G PO PACK: 17.0000 g | PACK | Freq: Every day | ORAL | Status: DC | PRN

## 2020-03-02 MED ORDER — ONDANSETRON 4 MG PO TBDP
4.0000 mg | ORAL_TABLET | Freq: Once | ORAL | Status: AC
  Administered 2020-03-02: 4 mg via ORAL
  Filled 2020-03-02: qty 1

## 2020-03-02 MED ORDER — ACETAMINOPHEN 325 MG PO TABS
650.0000 mg | ORAL_TABLET | Freq: Four times a day (QID) | ORAL | Status: DC | PRN
  Administered 2020-03-03: 650 mg via ORAL
  Filled 2020-03-02: qty 2

## 2020-03-02 MED ORDER — PIPERACILLIN-TAZOBACTAM 3.375 G IVPB
3.3750 g | Freq: Three times a day (TID) | INTRAVENOUS | Status: DC
  Administered 2020-03-02: 3.375 g via INTRAVENOUS
  Filled 2020-03-02: qty 50

## 2020-03-02 MED ORDER — ACETAMINOPHEN 325 MG PO TABS
650.0000 mg | ORAL_TABLET | Freq: Once | ORAL | Status: AC
  Administered 2020-03-02: 650 mg via ORAL
  Filled 2020-03-02: qty 2

## 2020-03-02 MED ORDER — RIVAROXABAN 10 MG PO TABS
10.0000 mg | ORAL_TABLET | Freq: Every day | ORAL | Status: DC
  Administered 2020-03-02 – 2020-03-03 (×2): 10 mg via ORAL
  Filled 2020-03-02 (×3): qty 1

## 2020-03-02 MED ORDER — CLONAZEPAM 0.5 MG PO TABS
1.0000 mg | ORAL_TABLET | Freq: Every day | ORAL | Status: DC
  Administered 2020-03-02 – 2020-03-04 (×3): 1 mg via ORAL
  Filled 2020-03-02 (×3): qty 2

## 2020-03-02 MED ORDER — SODIUM CHLORIDE 0.9 % IV SOLN
1.0000 g | INTRAVENOUS | Status: DC
  Administered 2020-03-02 – 2020-03-03 (×2): 1 g via INTRAVENOUS
  Filled 2020-03-02 (×3): qty 10

## 2020-03-02 MED ORDER — QUETIAPINE FUMARATE 200 MG PO TABS
200.0000 mg | ORAL_TABLET | Freq: Every day | ORAL | Status: DC
  Administered 2020-03-02 – 2020-03-03 (×2): 200 mg via ORAL
  Filled 2020-03-02: qty 1
  Filled 2020-03-02: qty 4
  Filled 2020-03-02 (×2): qty 1

## 2020-03-02 MED ORDER — ONDANSETRON HCL 4 MG PO TABS: 4.0000 mg | ORAL_TABLET | Freq: Four times a day (QID) | ORAL | Status: DC | PRN

## 2020-03-02 MED ORDER — POTASSIUM CHLORIDE CRYS ER 20 MEQ PO TBCR
40.0000 meq | EXTENDED_RELEASE_TABLET | Freq: Once | ORAL | Status: AC
  Administered 2020-03-02: 40 meq via ORAL
  Filled 2020-03-02: qty 2

## 2020-03-02 MED ORDER — ONDANSETRON HCL 4 MG/2ML IJ SOLN: 4.0000 mg | Freq: Four times a day (QID) | INTRAMUSCULAR | Status: DC | PRN

## 2020-03-02 NOTE — Progress Notes (Signed)
Pharmacy Antibiotic Note  Morgan Galloway is a 29 y.o. female admitted on 03/01/2020 with pyelonephritis.  Pharmacy has been consulted for zosyn dosing.  Plan: Zosyn 3.375g IV every 8 hours (extended 4h infusion) Monitor renal function, Cx and LOT     Temp (24hrs), Avg:101.3 F (38.5 C), Min:100.6 F (38.1 C), Max:102.6 F (39.2 C)  Recent Labs  Lab 03/01/20 1847 03/02/20 0545  WBC 13.3*  --   CREATININE 0.91  --   LATICACIDVEN  --  1.3    CrCl cannot be calculated (Unknown ideal weight.).    No Known Allergies  Daylene Posey, PharmD Clinical Pharmacist ED Pharmacist Phone # [phone] 03/02/2020 7:37 AM

## 2020-03-02 NOTE — ED Provider Notes (Signed)
Care handoff received from Mia McDonald PA-C at shift change please see previous provider note for full details of visit.  In short 29 year old female presented with right flank pain urinary hesitancy.  She has had a 5-day course of Macrobid and a 7-day course of Keflex without improvement of her symptoms.  She was started on Bactrim but is only had 1 pill so far.  Urine cultures were obtained at [facility] and are currently awaiting transfer of medical records.  CT renal stone study was obtained and is negative for occluding stone however possible recently passed stone, evidence of right pyelonephritis.  She was given fluids, pain medication, Tylenol by previous team and Zosyn was ordered.  Plan of care is consultation with medicine service for admission. Physical Exam  BP 111/61 (BP Location: Right Arm)   Pulse 83   Temp (!) 102.6 F (39.2 C) (Oral)   Resp 17   SpO2 100%   Physical Exam Constitutional:      General: She is not in acute distress.    Appearance: Normal appearance. She is well-developed. She is not ill-appearing or diaphoretic.  Pulmonary:     Effort: Pulmonary effort is normal. No respiratory distress.  Skin:    General: Skin is warm and dry.  Neurological:     Mental Status: She is alert.     GCS: GCS eye subscore is 4. GCS verbal subscore is 5. GCS motor subscore is 6.     Comments: Speech is clear and goal oriented, follows commands  Psychiatric:        Behavior: Behavior normal.    ED Course/Procedures     Procedures  MDM  Patient reassessed, she is resting comfortably in bed no acute distress.  Discussed plan of admission with patient she is agreeable.  She has no further questions or concerns. - 7:42 PM: Discussed case with Dr. Nedra Hai, patient accepted for admission.   Note: Portions of this report may have been transcribed using voice recognition software. Every effort was made to ensure accuracy; however, inadvertent computerized transcription  errors may still be present.      Morgan Galloway 03/02/20 0749    Dione Booze, MD 03/02/20 2237

## 2020-03-02 NOTE — ED Notes (Signed)
Lunch Tray Ordered @ 1033. 

## 2020-03-02 NOTE — ED Notes (Signed)
Pt is NSR on monitor 

## 2020-03-02 NOTE — H&P (Addendum)
Date: 03/02/2020               Patient Name:  Morgan Galloway MRN: [id number]  DOB: Oct 05, 1990 Age / Sex: 29 y.o., female   PCP: System, Provider Not In         Medical Service: Internal Medicine Teaching Service         Attending Physician: Dr. Oswaldo Done, Marquita Palms, *    First Contact: Dr. Arnoldo Lenis Pager: [phone]  Second Contact: Dr. Eliezer Bottom Pager: [phone]       After Hours (After 5p/  First Contact Pager: [phone]  weekends / holidays): Second Contact Pager: [phone]   Chief Complaint: Fever  History of Present Illness:   Morgan Galloway is a 29 yo F w/ PMH of Bipolar disorder presenting to [facility] with fever. She was in her usual state of health until about a month ago when she began to endorse dysuria, urgency and frequency without obvious inciting event. She has prescribed a course of treatment with 5 day course of nitrofurantoin on 10/11. She had no improvement in any of her urinary symptoms. She was seen by a different provider and was prescribed cephalexin for a 7 day course as well as fluconazole which she also took as prescribed. However, her symptoms did not improve at the time either. She went to [facility] last Friday and was prescribed a course of Bactrim but was not able to pick up the prescription as her pharmacy is closed on the weekend. On Saturday she began to have fevers, chills, weakness and malaise with back pain and the next day began to have nausea and NVNV emesis so she came to the ED for evaluation.  On review of systems, she also endorse abdominal pain, flank pain as well as pruritus. Denies gross hematuria, vaginal discharge, diarrhea, constipation, chest pain, dyspnea.  Meds: Current Meds  Medication Sig   clonazePAM (KLONOPIN) 1 MG tablet Take 1 mg by mouth daily.   divalproex (DEPAKOTE ER) 500 MG 24 hr tablet Take 500 mg by mouth 2 (two) times a day.   QUEtiapine (SEROQUEL) 200 MG tablet Take 200 mg by mouth at bedtime.    sulfamethoxazole-trimethoprim (BACTRIM DS) 800-160 MG tablet Take 1 tablet by mouth 2 (two) times daily.   [DISCONTINUED] clonazePAM (KLONOPIN) 0.25 MG disintegrating tablet Take 0.025 mg by mouth 2 (two) times daily as needed for anxiety.   Allergies: Allergies as of 03/01/2020   (No Known Allergies)   Past Medical History:  Diagnosis Date   Depression    Zoloft in past; stopped due to potential of pregnancy.   Gonorrhea    Headache    History of stillbirth    June 2012   Infection    UTI   Vaginal Pap smear, abnormal    f/u ok   Family History:  Multiple women in the family including mother and grandmother with breast cancer diagnosis. Never had genetic testing performed.  Social History: Lives with father, husband and her children. Currently works for [employer] Denies any tobacco use. Occasional alcohol, cannabinoid use.  Social History   Tobacco Use   Smoking status: Former Smoker    Packs/day: 0.50    Types: Cigarettes    Quit date: 08/20/2016    Years since quitting: 3.5   Smokeless tobacco: Never Used   Tobacco comment: quit with preg  Substance Use Topics   Alcohol use: Yes    Comment: occasionally   Drug use: Yes  Types: Marijuana    Comment: last use 11/03/18   Review of Systems: A complete ROS was negative except as per HPI.  Physical Exam: Blood pressure 111/61, pulse 83, temperature (!) 102.6 F (39.2 C), temperature source Oral, resp. rate 17, SpO2 100 %, unknown if currently breastfeeding.  Gen: Well-developed, well nourished, NAD HEENT: NCAT head, hearing intact, EOMI, MMM Neck: supple, ROM intact, no JVD CV: RRR, S1, S2 normal, No rubs, no murmurs, no gallops Pulm: CTAB, No rales, no wheezes, no dullness to percussion  Abd: Soft, BS+, NTND, No rebound, no guarding, no CVA tenderness Extm: ROM intact, Peripheral pulses intact, No peripheral edema Skin: Dry, Warm, normal turgor Neuro: AAOx3   Assessment & Plan by  Problem:  Principal Problem:   Pyelonephritis Active Problems:   Hypokalemia  Morgan Galloway is a 29 yo F w/ PMH of bipolar disorder presenting with fever due to pyelonephritis  Fever, Dysuria, Flank pain 2/2 pyelonephritis Present w/ dysuria with systemic symptoms. Fever peak of 102.10F on admission. U/A showing + nitrites, leukocyte with bacteuria. Wbc elevated at 13.3. Failed outpatient management after course of nitrofurantoin and cephalexin. Culture collected in outpatient setting per patient but unfortunately unable to access data to direct therapy. Will need admission for IV antibiotics. Re-peat culture pending. May be able to transition to oral earlier if culture data result quickly. Received dose of Zosyn in the ED. - D/c zosyn - Start ceftriaxone 1g daily - F/u urine/blood culture - Trend cbc  Bipolar Disorder On clonazepam, quetiapine and Depakote at home. Currently denies any psychiatric symptoms. PDMP reviewed and confirmed dose. - C/w home meds: clonazepam, diavlproex ER, quetiapine  DVT prophx: xarelto Diet: Regular Bowel: N/A Code: Full  Prior to Admission Living Arrangement: Home Anticipated Discharge Location: Home Barriers to Discharge: Medical tx  Dispo: Admit patient to Observation with expected length of stay less than 2 midnights.  Signed: Theotis Barrio, MD 03/02/2020, 10:36 AM Pager: [phone] After 5pm on weekdays and 1pm on weekends: On Call Pager: [phone]

## 2020-03-02 NOTE — ED Provider Notes (Addendum)
[facility] EMERGENCY [facility]PARTMENT Provider Note   CSN: [id number] Arrival date & time: 03/01/20  1739     History Chief Complaint  Patient presents with  . Fever  . Nausea  . Emesis  . Urinary Tract Infection    Morgan Galloway is a 29 y.o. female with a history of depression, gonorrhea who presents the emergency department with a chief complaint of right flank pain.  About a month ago, the patient began having urinary hesitancy and feeling as if she was incompletely emptying her bladder.  She was seen for an ED visit on 10/11 and was started on Macrobid for 5 days and given Diflucan for a yeast infection.  Her symptoms did not improve so she had a second ED visit on 10/21 and was started on 500 mg of Keflex twice daily for 7 days with no improvement in her symptoms.  Then, 4 days ago she developed a fever.  T-max 102.7 at home for which she has been taking Tylenol.  She is also developed worsening pain in her right flank.  Yesterday, she developed vomiting.   She was seen by her PCP 4 days ago and a urinalysis was completed and sent for culture.  She has not received the results of the culture.  She was discharged with a course of Bactrim, but has only taken 1 pill of the prescription thus far.  She denies vaginal discharge, vaginal bleeding, pelvic pain, left flank pain, hematuria, cough, shortness of breath, loss of sense of taste or smell, or URI symptoms.  She is in a monogamous relationship with her husband.  She does not have any concerns for STIs.  She has not had a menstrual cycle since the birth of her last child.  She has no history of kidney stones or UTIs.  She had a test for COVID-19 yesterday morning prior to [facility].  The history is provided by the patient and medical records. No language interpreter was used.       Past Medical History:  Diagnosis Date  . Depression    Zoloft in past; stopped due to potential of  pregnancy.  . Gonorrhea   . Headache   . History of stillbirth    June 2012  . Infection    UTI  . Vaginal Pap smear, abnormal    f/u ok    Patient Active Problem List   Diagnosis Date Noted  . Vaginal delivery 05/07/2017    Past Surgical History:  Procedure Laterality Date  . NO PAST SURGERIES       OB History    Gravida  6   Para  2   Term  2   Preterm  0   AB  4   Living  2     SAB  3   TAB      Ectopic      Multiple  0   Live Births  2           Family History  Problem Relation Age of Onset  . Cancer Maternal Grandmother   . Diabetes Maternal Grandmother   . Stroke Father [id number]  . Hypertension Father   . Bipolar disorder Mother   . Bipolar disorder Sister   . Other Neg Hx     Social History   Tobacco Use  . Smoking status: Former Smoker    Packs/day: 0.50    Types: Cigarettes    Quit  date: 08/20/2016    Years since quitting: 3.5  . Smokeless tobacco: Never Used  . Tobacco comment: quit with preg  Substance Use Topics  . Alcohol use: Yes    Comment: occasionally  . Drug use: Yes    Types: Marijuana    Comment: last use 11/03/18    Home Medications Prior to Admission medications   Medication Sig Start Date End Date Taking? Authorizing Provider  clonazePAM (KLONOPIN) 1 MG tablet Take 1 mg by mouth daily. 02/12/20  Yes [provider]  divalproex (DEPAKOTE ER) 500 MG 24 hr tablet Take 500 mg by mouth 2 (two) times a day.   Yes [provider]  QUEtiapine (SEROQUEL) 200 MG tablet Take 200 mg by mouth at bedtime. 02/14/20  Yes [provider]  sulfamethoxazole-trimethoprim (BACTRIM DS) 800-160 MG tablet Take 1 tablet by mouth 2 (two) times daily.   Yes [provider]    Allergies    Patient has no known allergies.  Review of Systems   Review of Systems  Constitutional: Positive for chills and fever. Negative for activity change.  HENT: Negative for congestion and sore throat.   Respiratory:  Negative for shortness of breath.   Cardiovascular: Negative for chest pain.  Gastrointestinal: Positive for abdominal pain, nausea and vomiting. Negative for constipation and diarrhea.  Genitourinary: Positive for dysuria and flank pain. Negative for frequency, hematuria, pelvic pain, vaginal bleeding, vaginal discharge and vaginal pain.       Urinary hesitancy  Musculoskeletal: Negative for back pain, myalgias, neck pain and neck stiffness.  Skin: Negative for rash.  Allergic/Immunologic: Negative for immunocompromised state.  Neurological: Negative for seizures, syncope, weakness and headaches.  Psychiatric/Behavioral: Negative for confusion.    Physical Exam Updated Vital Signs BP 111/61 (BP Location: Right Arm)   Pulse 83   Temp (!) 102.6 F (39.2 C) (Oral)   Resp 17   SpO2 100%   Physical Exam Vitals and nursing note reviewed.  Constitutional:      General: She is not in acute distress.    Appearance: She is ill-appearing. She is not toxic-appearing or diaphoretic.  HENT:     Head: Normocephalic.  Eyes:     Conjunctiva/sclera: Conjunctivae normal.  Cardiovascular:     Rate and Rhythm: Normal rate and regular rhythm.     Heart sounds: No murmur heard.  No friction rub. No gallop.   Pulmonary:     Effort: Pulmonary effort is normal. No respiratory distress.     Breath sounds: No stridor. No wheezing, rhonchi or rales.  Chest:     Chest wall: No tenderness.  Abdominal:     General: There is no distension.     Palpations: Abdomen is soft. There is no mass.     Tenderness: There is abdominal tenderness. There is right CVA tenderness. There is no left CVA tenderness, guarding or rebound.     Hernia: No hernia is present.     Comments: There is right CVA tenderness.  Abdomen is soft and nondistended.  There is minimal tenderness palpation in the right upper quadrant without rebound or guarding.  Negative Murphy sign.  Normoactive bowel sounds.  Musculoskeletal:      Cervical back: Neck supple.     Right lower leg: No edema.     Left lower leg: No edema.  Skin:    General: Skin is warm.     Findings: No rash.  Neurological:     Mental Status: She is alert.  Psychiatric:  Behavior: Behavior normal.     ED Results / Procedures / Treatments   Labs (all labs ordered are listed, but only abnormal results are displayed) Labs Reviewed  COMPREHENSIVE METABOLIC PANEL - Abnormal; Notable for the following components:      Result Value   Sodium 133 (*)    Glucose, Bld 101 (*)    Total Protein 8.2 (*)    All other components within normal limits  CBC - Abnormal; Notable for the following components:   WBC 13.3 (*)    All other components within normal limits  URINALYSIS, ROUTINE W REFLEX MICROSCOPIC - Abnormal; Notable for the following components:   Color, Urine AMBER (*)    APPearance CLOUDY (*)    Hgb urine dipstick SMALL (*)    Ketones, ur 20 (*)    Protein, ur 100 (*)    Nitrite POSITIVE (*)    Leukocytes,Ua LARGE (*)    WBC, UA >50 (*)    Bacteria, UA MANY (*)    All other components within normal limits  URINE CULTURE  CULTURE, BLOOD (ROUTINE X 2)  CULTURE, BLOOD (ROUTINE X 2)  LIPASE, BLOOD  LACTIC ACID, PLASMA  I-STAT BETA HCG BLOOD, ED ([facility], [facility], [facility] ONLY)    EKG None  Radiology CT Renal Stone Study  Result Date: 03/02/2020 CLINICAL DATA:  Pain EXAM: CT ABDOMEN AND PELVIS WITHOUT CONTRAST TECHNIQUE: Multidetector CT imaging of the abdomen and pelvis was performed following the standard protocol without IV contrast. COMPARISON:  None. FINDINGS: Lower chest: The lung bases are clear. The heart size is normal. Hepatobiliary: The liver is normal. Normal gallbladder.There is no biliary ductal dilation. Pancreas: Normal contours without ductal dilatation. No peripancreatic fluid collection. Spleen: Unremarkable. Adrenals/Urinary Tract: --Adrenal glands: Unremarkable. --Right kidney/ureter: There is a punctate nonobstructing stone in  the right kidney. There is no hydronephrosis. The right kidney appears to be enlarged with adjacent fat stranding. --Left kidney/ureter: No hydronephrosis or radiopaque kidney stones. --Urinary bladder: The urinary bladder is underdistended. Stomach/Bowel: --Stomach/Duodenum: No hiatal hernia or other gastric abnormality. Normal duodenal course and caliber. --Small bowel: Unremarkable. --Colon: Unremarkable. --Appendix: Normal. Vascular/Lymphatic: Normal course and caliber of the major abdominal vessels. --No retroperitoneal lymphadenopathy. --No mesenteric lymphadenopathy. --No pelvic or inguinal lymphadenopathy. Reproductive: Unremarkable Other: No ascites or free air. The abdominal wall is normal. Musculoskeletal. No acute displaced fractures. IMPRESSION: 1. The right kidney appears to be enlarged with adjacent fat stranding. This could be secondary to pyelonephritis or recently passed stone. There is no hydronephrosis. Correlation with urinalysis is recommended. 2. Punctate nonobstructing stone in the right kidney. 3. Normal appendix. Electronically Signed   By: Katherine Mantle M.D.   On: 03/02/2020 01:23    Procedures .Critical Care Performed by: Barkley Boards, PA-C Authorized by: Barkley Boards, PA-C   Critical care provider statement:    Critical care time (minutes):  40   Critical care time was exclusive of:  Separately billable procedures and treating other patients and teaching time   Critical care was necessary to treat or prevent imminent or life-threatening deterioration of the following conditions:  Sepsis   Critical care was time spent personally by me on the following activities:  Ordering and performing treatments and interventions, ordering and review of laboratory studies, ordering and review of radiographic studies, pulse oximetry, re-evaluation of patient's condition, review of old charts, obtaining history from patient or surrogate, examination of patient, evaluation of  patient's response to treatment and development of treatment plan with patient or  surrogate   I assumed direction of critical care for this patient from another provider in my specialty: no     (including critical care time)  Medications Ordered in ED Medications  sodium chloride 0.9 % bolus 1,000 mL (has no administration in time range)  ondansetron (ZOFRAN-ODT) disintegrating tablet 4 mg (4 mg Oral Given 03/01/20 1835)  oxyCODONE-acetaminophen (PERCOCET/ROXICET) 5-325 MG per tablet 1 tablet (1 tablet Oral Given 03/01/20 1838)  sodium chloride 0.9 % bolus 1,000 mL (1,000 mLs Intravenous New Bag/Given 03/02/20 0657)  ondansetron (ZOFRAN-ODT) disintegrating tablet 4 mg (4 mg Oral Given 03/02/20 3716)  acetaminophen (TYLENOL) tablet 650 mg (650 mg Oral Given 03/02/20 9678)  oxyCODONE (Oxy IR/ROXICODONE) immediate release tablet 5 mg (5 mg Oral Given 03/02/20 9381)    ED Course  I have reviewed the triage vital signs and the nursing notes.  Pertinent labs & imaging results that were available during my care of the patient were reviewed by me and considered in my medical decision making (see chart for details).    MDM Rules/Calculators/A&P                          This is a 29 y.o. female who presents to the ED for concern of  right flank pain, fever, vomiting, and urinary symptoms.   This involves an extensive number of treatment options, and is a complaint that carries with it a high risk of complications and morbidity.  The differential diagnosis includes pyelonephritis, sepsis secondary to obstructive urolithiasis, UTI, cholecystitis, pancreatitis, appendicitis, PID, ectopic pregnancy, septic abortion.   Vitals and Exam:    Febrile.  No tachycardia.  She is normotensive.  No hypoxia or tachypnea.   Lab Tests:    I ordered, reviewed, and interpreted labs, which included  CBC, urinalysis, metabolic panel, lactate, blood cultures that showed leukocytosis.  UA with nitrites, bacteria, WBC  clumps, RBCs, and hemoglobinuria, concerning for urinary infection.  Imaging Studies ordered:    I ordered imaging studies which included  CT stone study  I independently visualized and interpreted imaging which showed enlargement of the right kidney with stranding that is concerning for pyelonephritis.  However, could possibly indicate a passed stone.  There is no hydronephrosis.  I discussed this with the patient.  She denies recently passing a kidney stone.   Additional history obtained:    Previous records obtained and reviewed  Medicines ordered:    I ordered Zofran, Tylenol, IV fluids, and pain medication. Will order Zosyn.   Consultations Obtained:    ED secretary is attempting to obtain labs and cultures from patient's PCP at [facility]   Reevaluation:   After the interventions stated above, I reevaluated the patient and found improved  Plan and Disposition:  This is an otherwise healthy 29 year old female who presents with urinary symptoms for the last month and over the last 4 days has developed fever, vomiting, and right flank pain.  She previously completed a course of Macrobid and Keflex.  There is concerned that she has failed outpatient treatment for pyelonephritis.  Unfortunately, her 2 previous prescriptions were through ED visits and she has not provided a urine sample or urine culture until she was seen by her PCP 4 days ago.  ER secretary is attempting to obtain records from the patient's PCP at [facility] clinic, but no results are available at this time.  She will need to be admitted for further work-up and stabilization.  Patient  care transferred to [facility] at the end of my shift to follow-up on admission for the patient. Patient presentation, ED course, and plan of care discussed with review of all pertinent labs and imaging. Please see his/her note for further details regarding further ED course and disposition.  Final Clinical  Impression(s) / ED Diagnoses Final diagnoses:  Sepsis without acute organ dysfunction, due to unspecified organism [facility])  Pyelonephritis    Rx / DC Orders ED Discharge Orders    None       Rhaelyn Giron A, PA-C 03/02/20 0728    Barkley Boards, PA-C 03/02/20 0731    Dione Booze, MD 03/02/20 2237

## 2020-03-03 DIAGNOSIS — E876 Hypokalemia: Secondary | ICD-10-CM | POA: Diagnosis present

## 2020-03-03 DIAGNOSIS — F1290 Cannabis use, unspecified, uncomplicated: Secondary | ICD-10-CM | POA: Diagnosis present

## 2020-03-03 DIAGNOSIS — Z803 Family history of malignant neoplasm of breast: Secondary | ICD-10-CM | POA: Diagnosis not present

## 2020-03-03 DIAGNOSIS — N10 Acute tubulo-interstitial nephritis: Principal | ICD-10-CM

## 2020-03-03 DIAGNOSIS — Z8249 Family history of ischemic heart disease and other diseases of the circulatory system: Secondary | ICD-10-CM | POA: Diagnosis not present

## 2020-03-03 DIAGNOSIS — Z818 Family history of other mental and behavioral disorders: Secondary | ICD-10-CM | POA: Diagnosis not present

## 2020-03-03 DIAGNOSIS — Z833 Family history of diabetes mellitus: Secondary | ICD-10-CM | POA: Diagnosis not present

## 2020-03-03 DIAGNOSIS — R3911 Hesitancy of micturition: Secondary | ICD-10-CM | POA: Diagnosis present

## 2020-03-03 DIAGNOSIS — F17200 Nicotine dependence, unspecified, uncomplicated: Secondary | ICD-10-CM | POA: Diagnosis present

## 2020-03-03 DIAGNOSIS — F319 Bipolar disorder, unspecified: Secondary | ICD-10-CM | POA: Diagnosis present

## 2020-03-03 DIAGNOSIS — Z79899 Other long term (current) drug therapy: Secondary | ICD-10-CM | POA: Diagnosis not present

## 2020-03-03 DIAGNOSIS — Z823 Family history of stroke: Secondary | ICD-10-CM | POA: Diagnosis not present

## 2020-03-03 DIAGNOSIS — R109 Unspecified abdominal pain: Secondary | ICD-10-CM | POA: Diagnosis not present

## 2020-03-03 DIAGNOSIS — Z1611 Resistance to penicillins: Secondary | ICD-10-CM | POA: Diagnosis present

## 2020-03-03 DIAGNOSIS — Z20822 Contact with and (suspected) exposure to covid-19: Secondary | ICD-10-CM | POA: Diagnosis present

## 2020-03-03 LAB — BASIC METABOLIC PANEL
Anion gap: 12 (ref 5–15)
BUN: 7 mg/dL (ref 6–20)
CO2: 21 mmol/L — ABNORMAL LOW (ref 22–32)
Calcium: 8.8 mg/dL — ABNORMAL LOW (ref 8.9–10.3)
Chloride: 102 mmol/L (ref 98–111)
Creatinine, Ser: 0.98 mg/dL (ref 0.44–1.00)
GFR, Estimated: >60 mL/min (ref >60)
Glucose, Bld: 95 mg/dL (ref 70–99)
Potassium: 3.7 mmol/L (ref 3.5–5.1)
Sodium: 135 mmol/L (ref 135–145)

## 2020-03-03 LAB — CBC
HCT: 35.1 % — ABNORMAL LOW (ref 36.0–46.0)
Hemoglobin: 11.9 g/dL — ABNORMAL LOW (ref 12.0–15.0)
MCH: 30.7 pg (ref 26.0–34.0)
MCHC: 33.9 g/dL (ref 30.0–36.0)
MCV: 90.7 fL (ref 80.0–100.0)
Platelets: 171 10*3/uL (ref 150–400)
RBC: 3.87 MIL/uL (ref 3.87–5.11)
RDW: 12.5 % (ref 11.5–15.5)
WBC: 12.6 10*3/uL — ABNORMAL HIGH (ref 4.0–10.5)
nRBC: 0 % (ref 0.0–0.2)

## 2020-03-03 LAB — MAGNESIUM: Magnesium: 2.2 mg/dL (ref 1.7–2.4)

## 2020-03-03 NOTE — Hospital Course (Addendum)
Fever, Dysuria, Flank pain 2/2 pyelonephritis Presented w/ dysuria with systemic symptoms. Fever peak of 102.37F on admission. U/A showing + nitrites, leukocyte with bacteuria. Wbc elevated at 13.3. Failed outpatient management after course of nitrofurantoin and cephalexin. Culture collected in outpatient setting per patient but unfortunately unable to access data to direct therapy. Started IV antibiotics Ceftriaxone 1 g.  Re-peat culture pending. May be able to transition to oral earlier if culture data result quickly. Received dose of Zosyn in the ED and D/C.    Bipolar Disorder On clonazepam, quetiapine and Depakote at home. Currently denies any psychiatric symptoms. PDMP reviewed and confirmed dose.C/w home meds: clonazepam, diavlproex ER, quetiapine

## 2020-03-03 NOTE — Progress Notes (Addendum)
Subjective: HD#1 No acute overnight events. Patient evaluated at bedside this AM. Says she feels a little better this morning, thinks she has broken her fever. Reports only one previous episode of yeast infection and UTI, no previous episodes of antibiotic-resistance. Mentions she does not want to eat the food provided here. Her abdominal pain has improved. Says her back doesn't hurt this morning. Discussed visitor hours with patient. She originally did one urine test on her own then went to urgent care at [facility] last Friday and had urinary culture. Will obtain records. Discussed that she has kidney infection and being treated with antibiotics.  Objective:  Vital signs in last 24 hours: Vitals:   03/02/20 1357 03/02/20 1650 03/02/20 2101 03/03/20 0434  BP: (!) 107/52 (!) 119/58 (!) 111/49 (!) 96/46  Pulse: 89 83 88 84  Resp: 17 16 16 15   Temp: 98.8 F (37.1 C) 99 F (37.2 C) 99.5 F (37.5 C) 98.9 F (37.2 C)  TempSrc:  Oral Oral Oral  SpO2: 100% 100% 99% 100%   CBC Latest Ref Rng & Units 03/03/2020 03/02/2020 03/01/2020  WBC 4.0 - 10.5 K/uL 12.6(H) 14.1(H) 13.3(H)  Hemoglobin 12.0 - 15.0 g/dL 11.9(L) 13.0 13.7  Hematocrit 36 - 46 % 35.1(L) 39.8 41.9  Platelets 150 - 400 K/uL 171 144(L) 154   BMP Latest Ref Rng & Units 03/03/2020 03/02/2020 03/01/2020  Glucose 70 - 99 mg/dL 95 13/11/2019) 474(Q)  BUN 6 - 20 mg/dL 7 7 9   Creatinine 0.44 - 1.00 mg/dL 595(G 3.87  Sodium 135 - 145 mmol/L 135 137 133(L)  Potassium 3.5 - 5.1 mmol/L 3.7 3.3(L) 4.0  Chloride 98 - 111 mmol/L 102 103 99  CO2 22 - 32 mmol/L 21(L) 22 22  Calcium 8.9 - 10.3 mg/dL 5.64) 3.32) 9.1     Gen: Well-developed, well nourished, NAD HEENT: NCAT head, hearing intact, EOMI, MMM Neck: supple, ROM intact, no JVD CV: RRR, S1, S2 normal, No rubs, no murmurs, no gallops  Abd: Soft, BS+, NTND, No rebound, no guarding, no CVA tenderness  Extm: ROM intact, Peripheral pulses intact, No peripheral edema Skin:  Dry, Warm, normal turgor Neuro: AAOx3 Psychiatric: Normal mood and affect  Assessment/Plan:  [name] is a 29 year old person admitted with right-sided pyelonephritis  Principal Problem:   Pyelonephritis Active Problems:   Hypokalemia  Right-sided pyelonephritis Symptomatically improved today after receiving IV antibiotics. Afebrile today and had highest temperature of 99.5 in last 24 hours. Presented w/ dysuria with systemic symptoms. Fever peak of 102.23F on admission. U/A showing + nitrites, leukocyte with bacteuria. Wbc elevated at 13.3. Failed outpatient management after course of nitrofurantoin and cephalexin. Culture collected in outpatient setting per patient but unfortunately unable to access data to direct therapy. Repeat culture pending.  Blood culture no growth at 1 day.  May be able to transition to oral earlier if culture data result quickly. CT scan abd/pelvis rule out perinephric abscess or other structural disease, she does have a nonobstructing kidney stone which is likely incidental.  - Continue ceftriaxone 1g daily - F/u urine/blood culture - Advance diet and encourage more oral intake.   Bipolar Disorder Mood is okay today. On clonazepam, quetiapine and Depakote at home. Currently denies any psychiatric symptoms. PDMP reviewed and confirmed dose.  - C/w home meds: clonazepam, diavlproex ER, quetiapine  Prior to Admission Living Arrangement: Home Anticipated Discharge Location: Home Barriers to Discharge: Continued medical management Dispo: Anticipated discharge in approximately 2-3 day(s).   Quaid Yeakle, Esmeralda Arthur, MD  03/03/2020, 9:33 AM Pager: [phone] After 5pm on weekdays and 1pm on weekends: On Call pager [phone]

## 2020-03-03 NOTE — Progress Notes (Signed)
Returned to room to perform am assessment.This rn introduced self and pt began to use cell phone. Will return to assess pt.

## 2020-03-03 NOTE — Progress Notes (Signed)
Pt talking on cell phone. Informed pt I will return to assess her.

## 2020-03-03 NOTE — Progress Notes (Signed)
Bedside rounds done with nurse kristy. Pt calmly resting prone position, note pt covered waist down with bed sheet, pt not wearing any clothing waist up.

## 2020-03-04 ENCOUNTER — Other Ambulatory Visit (HOSPITAL_COMMUNITY): Payer: Self-pay | Admitting: Student in an Organized Health Care Education/Training Program

## 2020-03-04 DIAGNOSIS — F319 Bipolar disorder, unspecified: Secondary | ICD-10-CM | POA: Diagnosis not present

## 2020-03-04 DIAGNOSIS — N10 Acute tubulo-interstitial nephritis: Secondary | ICD-10-CM | POA: Diagnosis not present

## 2020-03-04 LAB — BASIC METABOLIC PANEL
Anion gap: 13 (ref 5–15)
BUN: 5 mg/dL — ABNORMAL LOW (ref 6–20)
CO2: 25 mmol/L (ref 22–32)
Calcium: 9.2 mg/dL (ref 8.9–10.3)
Chloride: 101 mmol/L (ref 98–111)
Creatinine, Ser: 0.84 mg/dL (ref 0.44–1.00)
GFR, Estimated: >60 mL/min (ref >60)
Glucose, Bld: 103 mg/dL — ABNORMAL HIGH (ref 70–99)
Potassium: 3.8 mmol/L (ref 3.5–5.1)
Sodium: 139 mmol/L (ref 135–145)

## 2020-03-04 LAB — CBC
HCT: 37.5 % (ref 36.0–46.0)
Hemoglobin: 12.6 g/dL (ref 12.0–15.0)
MCH: 30.6 pg (ref 26.0–34.0)
MCHC: 33.6 g/dL (ref 30.0–36.0)
MCV: 91 fL (ref 80.0–100.0)
Platelets: 201 10*3/uL (ref 150–400)
RBC: 4.12 MIL/uL (ref 3.87–5.11)
RDW: 12.6 % (ref 11.5–15.5)
WBC: 10.5 10*3/uL (ref 4.0–10.5)
nRBC: 0 % (ref 0.0–0.2)

## 2020-03-04 LAB — URINE CULTURE: Culture: 20000 — AB

## 2020-03-04 MED ORDER — CEFDINIR 300 MG PO CAPS: 300.0000 mg | ORAL_CAPSULE | Freq: Two times a day (BID) | ORAL | 0 refills | Status: AC

## 2020-03-04 MED ORDER — CEFDINIR 300 MG PO CAPS
300.0000 mg | ORAL_CAPSULE | Freq: Two times a day (BID) | ORAL | Status: DC
  Administered 2020-03-04: 300 mg via ORAL
  Filled 2020-03-04 (×2): qty 1

## 2020-03-04 MED FILL — CEFDINIR 300 MG CAPSULE: 300 | 7 days supply | Qty: 15 | Fill #0

## 2020-03-04 NOTE — Progress Notes (Signed)
   Subjective: HD # 2 No acute overnight events.  Patient doing okay this morning. Has eaten some fruit. Does not endorse nausea. Reports some increased frequency in urination, but previous symptoms have resolved. Denies fever. Mentions she is a smoker and usually smokes when she eats, which has made it hard to eat in the hospital. Discussed she feels that she could eat better at home. Discussed results of urine culture, plan for transition to oral antibiotics. Will give patient work excuse as well to return to work Monday, November 15th, 2021.  Objective:  Vital signs in last 24 hours: Vitals:   03/03/20 1210 03/03/20 1756 03/03/20 2132 03/04/20 0610  BP: (!) 99/45 (!) 112/55 (!) 102/43 (!) 96/48  Pulse: 81 87 74 69  Resp: 16 16 17 18   Temp: 99.2 F (37.3 C) 98.4 F (36.9 C) 99.7 F (37.6 C) 98.6 F (37 C)  TempSrc: Oral Oral Oral Oral  SpO2: 100% 98% 100% 100%   General: Well developed, NAD, lying comfortably in bed. Abdomen: ND/NT, soft, no guarding Neuro: AAO * 3 Psychiatric: Normal mood and affect  Assessment/Plan: Morgan Galloway is a 29 year old person admitted with right-sided pyelonephritis.  Principal Problem:   Pyelonephritis Active Problems:   Hypokalemia  Right-sided pyelonephritis Patient reports feeling better. Denies abdominal pain & back pain. Denies urinary urgency, dysuria.  Symptomatically improved today after receiving IV antibiotics. Afebrile in AM and had highest temperature of 99.7 in last 24 hours. Presented w/ dysuria with systemic symptoms. Fever peak of 102.61F on admission. U/A showing + nitrites, leukocyte with bacteuria. Wbc elevated at 13.3. Failed outpatient management after course of nitrofurantoin and cephalexin. Culture collected in outpatient setting per patient but unfortunately unable to access data to direct therapy. Repeat culture urine culture shows 20,000 colonies/ml Escherichia Coli- Resistant to Ampicillin, Ampicillin/sulbactam & 26  Morgan Galloway. Sensitive to Cefazolin, Cefepime, Ceftriaxone, Ciprofloxacin, Gentamicin, Imipenem, Nitrofurantoin & Pi/Tazo. Blood culture no growth in 2 days.  May be able to transition to oral earlier if culture data result quickly. CT scan abd/pelvis rule out perinephric abscess or other structural disease, she does have a nonobstructing kidney stone which is likely incidental.   - D/C ceftriaxone 1g daily - Start Cefdinir 300 mg BID - Advance diet and encourage more oral intake.  Bipolar Disorder She presents with normal mood and affect today. On clonazepam, quetiapine and Depakote at home. Currently denies any psychiatric symptoms. PDMP reviewed and confirmed dose.  - C/w home meds: clonazepam, diavlproex ER, quetiapine  Prior to Admission Living Arrangement: Home Anticipated Discharge Location: Home Barriers to Discharge: Continued medical management Dispo: Anticipated discharge is today.  Morgan Michaelis, MD 03/04/2020, 11:56 AM Pager: [phone] After 5pm on weekdays and 1pm on weekends: On Call pager [phone]

## 2020-03-04 NOTE — Progress Notes (Signed)
RN gave patient discharge instructions and the patient stated understanding, IV has been removed and one new medication escribed to [facility] pharmacy. Husband on the way for pickup.

## 2020-03-04 NOTE — Discharge Summary (Signed)
Name: Morgan Morgan Galloway MRN: [id number] DOB: December 29, 1990 29 y.o. PCP: System, Provider Not In  Date of Admission: 03/01/2020  6:20 PM Date of Discharge: 03/04/2020 Attending Physician: Dr. Erlinda Hong  Discharge Diagnosis: 1. Right sided pyelonephritis 2. Bipolar Disorder   Discharge Medications: Allergies as of 03/04/2020   No Known Allergies     Medication List    STOP taking these medications   sulfamethoxazole-trimethoprim 800-160 MG tablet Commonly known as: BACTRIM DS     TAKE these medications   cefdinir 300 MG capsule Commonly known as: OMNICEF Take 1 capsule (300 mg total) by mouth every 12 (twelve) hours for 7 days.   clonazePAM 1 MG tablet Commonly known as: KLONOPIN Take 1 mg by mouth daily.   divalproex 500 MG 24 hr tablet Commonly known as: DEPAKOTE ER Take 500 mg by mouth 2 (two) times a day.   QUEtiapine 200 MG tablet Commonly known as: SEROQUEL Take 200 mg by mouth at bedtime.       Disposition and follow-up:   Morgan Morgan Galloway was discharged from [facility] in Stable condition.  At the hospital follow up visit please address:  1. Right-sided pyelonephritis: IV Ceftriaxone was stopped on 03/04/20 AM. You were transitioned to oral Cefdinir on 03/04/2020 AM and you will continue Cefdinir twice daily for 7 days starting 03/05/2020 and 1 dose on 03/04/2020 in the evening. If asymptomatic, no follow up.  2.  Bipolar Disorder: Continue clonazepam, quetiapine and Depakote at home. Please see your psychiatrist if needed. 2.  Labs / imaging needed at time of follow-up: None  3.  Pending labs/ test needing follow-up: None  Follow-up Appointments:  Follow-up Information    Center, [facility]. Schedule an appointment as soon as possible for a visit in 1 week(s).   Why: If still symptomatic, please make an appointment. Contact information: [address] [place] [address] [phone]               Hospital  Course by problem list:   Right sided pyelonephritis Presented w/ dysuria with systemic symptoms. Fever peak of 102.45F on admission. U/A showing + nitrites, leukocyte with bacteuria. Wbc elevated at 13.3. Failed outpatient management after course of nitrofurantoin and cephalexin. Culture collected in outpatient setting per patient but unfortunately unable to access data to direct therapy. Started IV antibiotics Ceftriaxone 1 g.  Urine culture shows 20,000 colonies/ml Escherichia Coli- Resistant to Ampicillin, Ampicillin/sulbactam & [name] Gaetana Michaelis. Sensitive to Cefazolin, Cefepime, Ceftriaxone, Ciprofloxacin, Gentamicin, Imipenem, Nitrofurantoin & Pi/Tazo. Received dose of Zosyn in the ED and D/C. I/V Ceftriaxone was stopped on 03/04/20 AM.  Transitioned to oral Cefdinir on 03/04/2020 AM BID   Bipolar Disorder On clonazepam, quetiapine and Depakote at home. Currently denies any psychiatric symptoms. PDMP reviewed and confirmed dose.C/w home meds: clonazepam, diavlproex ER, quetiapine.   Discharge Vitals:   BP (!) 105/57 (BP Location: Right Arm)   Pulse 64   Temp 98.8 F (37.1 C) (Oral)   Resp 18   SpO2 95%   Pertinent Labs, Studies, and Procedures:  CBC Latest Ref Rng & Units 03/04/2020 03/03/2020 03/02/2020  WBC 4.0 - 10.5 K/uL 10.5 12.6(H) 14.1(H)  Hemoglobin 12.0 - 15.0 g/dL 21.3 11.9(L) 13.0  Hematocrit 36 - 46 % 37.5 35.1(L) 39.8  Platelets 150 - 400 K/uL 201 171 144(L)   BMP Latest Ref Rng & Units 03/04/2020 03/03/2020 03/02/2020  Glucose 70 - 99 mg/dL 086(V) 95 784(O)  BUN 6 - 20 mg/dL 5(L)  7 7  Creatinine 0.44 - 1.00 mg/dL 5.40 0.86 7.61  Sodium 135 - 145 mmol/L 139 135 137  Potassium 3.5 - 5.1 mmol/L 3.8 3.7 3.3(L)  Chloride 98 - 111 mmol/L 101 102 103  CO2 22 - 32 mmol/L 25 21(L) 22  Calcium 8.9 - 10.3 mg/dL 9.2 9.5(K) 9.3(O)   Urinalysis    Component Value Date/Time   COLORURINE AMBER (A) 03/01/2020 1847   APPEARANCEUR CLOUDY (A) 03/01/2020 1847   LABSPEC 1.024  03/01/2020 1847   PHURINE 6.0 03/01/2020 1847   GLUCOSEU NEGATIVE 03/01/2020 1847   HGBUR SMALL (A) 03/01/2020 1847   BILIRUBINUR NEGATIVE 03/01/2020 1847   BILIRUBINUR neg 12/23/2013 1852   KETONESUR 20 (A) 03/01/2020 1847   PROTEINUR 100 (A) 03/01/2020 1847   UROBILINOGEN 0.2 07/01/2014 0244   NITRITE POSITIVE (A) 03/01/2020 1847   LEUKOCYTESUR LARGE (A) 03/01/2020 1847   Urine culture shows 20,000 colonies/ml Escherichia Coli- Resistant to Ampicillin, Ampicillin/sulbactam & [name] Gaetana Michaelis. Sensitive to Cefazolin, Cefepime, Ceftriaxone, Ciprofloxacin, Gentamicin, Imipenem, Nitrofurantoin & Pi/Tazo   Discharge Instructions: Discharge Instructions    Call MD for:  difficulty breathing, headache or visual disturbances   Complete by: As directed    Call MD for:  extreme fatigue   Complete by: As directed    Call MD for:  persistant dizziness or light-headedness   Complete by: As directed    Call MD for:  persistant nausea and vomiting   Complete by: As directed    Call MD for:  redness, tenderness, or signs of infection (pain, swelling, redness, odor or green/yellow discharge around incision site)   Complete by: As directed    Call MD for:  severe uncontrolled pain   Complete by: As directed    Call MD for:  temperature >100.4   Complete by: As directed    Diet general   Complete by: As directed    Discharge instructions   Complete by: As directed    [name]: You were admitted for Fever, Dysuria, Flank pain 2/2 pyelonephritis. You presented w/ dysuria with systemic symptoms- Fever peak of 102.67F on admission. U/A showed + nitrites, leukocyte with bacteuria. WBC elevated at 13.3. Failed outpatient management after course of nitrofurantoin and cephalexin. Received dose of Zosyn in the ED and D/C. You were started on IV antibiotics Ceftriaxone 1 g.  Re-peat urine culture showed presence of E. Coli bacteria. IV Ceftriaxone was stopped on 03/04/20 AM. You were transitioned to oral  Cefdinir on 03/04/2020 AM and you will continue Cefdinir twice daily for 7 days starting 03/05/2020 and 1 dose on 03/04/2020 in the evening.   Increase activity slowly   Complete by: As directed       Signed: Arnoldo Lenis, MD 03/04/2020, 3:11 PM   Pager: [phone]

## 2020-03-04 NOTE — Progress Notes (Signed)
IMTS B1: [facility]  While rounding, the team had a question about which antibiotic would be best to send the patient home one. The patient is currently receiving ceftriaxone IV.  Spoke with the team about minimum inhibitory concentrations (MIC) values on the sensitivity report. The reading may say sensitive, but the lower the MIC for the drug the better of a choice. The team wanted to transition to an oral cephalosporin (Keflex). In this case, the MIC of cefazolin was 4 and ceftriaxone was 0.25. Given the patient had a previous course of cephalexin which failed and the lower MIC for ceftriaxone, I recommended to the team to use cefdinir (Omnicef) 300 mg twice daily for oral antibiotic choice.

## 2020-03-07 LAB — CULTURE, BLOOD (ROUTINE X 2)
Culture: NO GROWTH
Culture: NO GROWTH
Special Requests: ADEQUATE
Special Requests: ADEQUATE

## 2020-04-26 ENCOUNTER — Ambulatory Visit (HOSPITAL_COMMUNITY)
Admission: EM | Admit: 2020-04-26 | Discharge: 2020-04-26 | Disposition: A | Discharge status: Home / Self Care | Payer: Medicaid Other | Attending: Family Medicine | Admitting: Family Medicine

## 2020-04-26 ENCOUNTER — Other Ambulatory Visit: Payer: Self-pay

## 2020-04-26 ENCOUNTER — Telehealth: Payer: Medicaid Other | Admitting: Family

## 2020-04-26 ENCOUNTER — Encounter (HOSPITAL_COMMUNITY): Payer: Self-pay | Admitting: *Deleted

## 2020-04-26 DIAGNOSIS — R509 Fever, unspecified: Secondary | ICD-10-CM

## 2020-04-26 DIAGNOSIS — N390 Urinary tract infection, site not specified: Secondary | ICD-10-CM | POA: Diagnosis present

## 2020-04-26 DIAGNOSIS — R109 Unspecified abdominal pain: Secondary | ICD-10-CM

## 2020-04-26 DIAGNOSIS — Z20822 Contact with and (suspected) exposure to covid-19: Secondary | ICD-10-CM | POA: Insufficient documentation

## 2020-04-26 LAB — POCT URINALYSIS DIPSTICK, ED / UC
Bilirubin Urine: NEGATIVE
Glucose, UA: NEGATIVE mg/dL
Ketones, ur: NEGATIVE mg/dL
Leukocytes,Ua: NEGATIVE
Nitrite: POSITIVE — AB
Protein, ur: 30 mg/dL — AB
Specific Gravity, Urine: 1.02 (ref 1.005–1.030)
Urobilinogen, UA: 4 mg/dL — ABNORMAL HIGH (ref 0.0–1.0)
pH: 6 (ref 5.0–8.0)

## 2020-04-26 LAB — POC URINE PREG, ED: Preg Test, Ur: NEGATIVE

## 2020-04-26 MED ORDER — CEPHALEXIN 500 MG PO CAPS: 500.0000 mg | ORAL_CAPSULE | Freq: Two times a day (BID) | ORAL | 0 refills | Status: AC

## 2020-04-26 MED ORDER — CEPHALEXIN 500 MG PO CAPS: 500.0000 mg | ORAL_CAPSULE | Freq: Two times a day (BID) | ORAL | 0 refills | Status: DC

## 2020-04-26 MED ORDER — ACETAMINOPHEN 325 MG PO TABS
650.0000 mg | ORAL_TABLET | Freq: Once | ORAL | Status: AC
  Administered 2020-04-26: 650 mg via ORAL

## 2020-04-26 MED ORDER — ACETAMINOPHEN 325 MG PO TABS
ORAL_TABLET | ORAL | Status: AC
  Filled 2020-04-26: qty 2

## 2020-04-26 NOTE — ED Triage Notes (Addendum)
Pt reports lower back pain same as when she had a UTI , Pt reports she does not have dysuria. Pt reports a fever of 101.9 pt took tylenol . Pt also reports an admission to [facility] Nov 2021 for kidney infection.

## 2020-04-26 NOTE — Progress Notes (Signed)
Based on what you shared with me, I feel your condition warrants further evaluation and I recommend that you be seen for a face to face office visit.  Given your symptoms, you need to be seen face to face today! You need to go to the Urgent Care or ED.    NOTE: If you entered your credit card information for this eVisit, you will not be charged. You may see a "hold" on your card for the $35 but that hold will drop off and you will not have a charge processed.   If you are having a true medical emergency please call 911.      For an urgent face to face visit, [place] has five urgent care centers for your convenience:     [facility] at [facility] Directions [phone] [address] [place], [place] [address] . 10 am - 6pm Monday - Friday    [facility]) Get Driving Directions [phone] [address] [place], [place] [address] . 10 am to 8 pm Monday-Friday . 12 pm to 8 pm [facility] Urgent Care at [facility] Get Driving Directions [phone] [address], [address] [place], [place] [address] . 8 am to 8 pm Monday-Friday . 9 am to 6 pm Saturday . 11 am to 6 pm Sunday     [facility] Urgent Care at [facility] Get Driving Directions  [phone] [address] [place], [place] [address] . 8 am to 8 pm Monday-Friday . 8 am to 4 pm [facility] Urgent Care at [facility] Directions [phone] [address] [place], [place] [address] . 12 pm to 6 pm Monday-Friday      Your e-visit answers were reviewed by a board certified advanced clinical practitioner to complete your personal care plan.  Thank you for using e-Visits.

## 2020-04-26 NOTE — Discharge Instructions (Signed)
Take medication as prescribed If symptoms worsen follow up in ED

## 2020-04-26 NOTE — ED Provider Notes (Signed)
[facility]    CSN: [id number] Arrival date & time: 04/26/20  1847      History   Chief Complaint Chief Complaint  Patient presents with  . Back Pain    fever  . Fever    HPI Morgan Galloway is a 30 y.o. female.   Pt complains of right flank pain and dysuria that started yesterday. Recent admission for pyelonephritis.  Reports she improved after discharged, UTI sx started yesterday. Reports fever today of 101.  She has taken tylenol with improvement of pain and fever.  She denies nausea, vomiting, abdominal pain.      Past Medical History:  Diagnosis Date  . Depression    Zoloft in past; stopped due to potential of pregnancy.  . Gonorrhea   . Headache   . History of stillbirth    June 2012  . Infection    UTI  . Vaginal Pap smear, abnormal    f/u ok    Patient Active Problem List   Diagnosis Date Noted  . Hypokalemia 03/03/2020  . Pyelonephritis 03/02/2020    Past Surgical History:  Procedure Laterality Date  . NO PAST SURGERIES      OB History    Gravida  6   Para  2   Term  2   Preterm  0   AB  4   Living  2     SAB  3   IAB      Ectopic      Multiple  0   Live Births  2            Home Medications    Prior to Admission medications   Medication Sig Start Date End Date Taking? Authorizing Provider  cephALEXin (KEFLEX) 500 MG capsule Take 1 capsule (500 mg total) by mouth 2 (two) times daily for 7 days. 04/26/20 05/03/20 Yes Claudio Mondry, PA-C  clonazePAM (KLONOPIN) 1 MG tablet Take 1 mg by mouth daily. 02/12/20   [provider]  divalproex (DEPAKOTE ER) 500 MG 24 hr tablet Take 500 mg by mouth 2 (two) times a day.    [provider]  QUEtiapine (SEROQUEL) 200 MG tablet Take 200 mg by mouth at bedtime. 02/14/20   [provider]    Family History Family History  Problem Relation Age of Onset  . Cancer Maternal Grandmother   . Diabetes Maternal Grandmother   . Stroke Father 99  .  Hypertension Father   . Bipolar disorder Mother   . Bipolar disorder Sister   . Other Neg Hx     Social History Social History   Tobacco Use  . Smoking status: Former Smoker    Packs/day: 0.50    Types: Cigarettes    Quit date: 08/20/2016    Years since quitting: 3.6  . Smokeless tobacco: Never Used  . Tobacco comment: quit with preg  Substance Use Topics  . Alcohol use: Yes    Comment: occasionally  . Drug use: Yes    Types: Marijuana    Comment: last use 11/03/18     Allergies   Patient has no known allergies.   Review of Systems Review of Systems  Constitutional: Positive for fever. Negative for chills.  HENT: Negative for ear pain and sore throat.   Eyes: Negative for pain and visual disturbance.  Respiratory: Negative for cough and shortness of breath.   Cardiovascular: Negative for chest pain and palpitations.  Gastrointestinal: Negative for abdominal pain, diarrhea, nausea and  vomiting.  Genitourinary: Negative for dysuria and hematuria.  Musculoskeletal: Positive for back pain (right flank pain). Negative for arthralgias.  Skin: Negative for color change and rash.  Neurological: Negative for seizures and syncope.  All other systems reviewed and are negative.    Physical Exam Triage Vital Signs ED Triage Vitals  Enc Vitals Group     BP 04/26/20 2020 (!) 115/57     Pulse Rate 04/26/20 2020 87     Resp 04/26/20 2020 19     Temp 04/26/20 2020 98.7 F (37.1 C)     Temp Source 04/26/20 2020 Oral     SpO2 04/26/20 2020 100 %     Weight --      Height --      Head Circumference --      Peak Flow --      Pain Score 04/26/20 2022 3     Pain Loc --      Pain Edu? --      Excl. in GC? --    No data found.  Updated Vital Signs BP (!) 115/57 (BP Location: Right Arm)   Pulse 87   Temp 98.7 F (37.1 C) (Oral)   Resp 19   LMP 04/13/2020   SpO2 100%   Visual Acuity Right Eye Distance:   Left Eye Distance:   Bilateral Distance:    Right Eye Near:    Left Eye Near:    Bilateral Near:     Physical Exam Vitals and nursing note reviewed.  Constitutional:      General: She is not in acute distress.    Appearance: She is well-developed and well-nourished.  HENT:     Head: Normocephalic and atraumatic.  Eyes:     Conjunctiva/sclera: Conjunctivae normal.  Cardiovascular:     Rate and Rhythm: Normal rate and regular rhythm.     Heart sounds: No murmur heard.   Pulmonary:     Effort: Pulmonary effort is normal. No respiratory distress.     Breath sounds: Normal breath sounds.  Abdominal:     Palpations: Abdomen is soft.     Tenderness: There is no abdominal tenderness.  Musculoskeletal:        General: No edema.     Cervical back: Neck supple.  Skin:    General: Skin is warm and dry.  Neurological:     Mental Status: She is alert.  Psychiatric:        Mood and Affect: Mood and affect normal.      UC Treatments / Results  Labs (all labs ordered are listed, but only abnormal results are displayed) Labs Reviewed  POCT URINALYSIS DIPSTICK, ED / UC - Abnormal; Notable for the following components:      Result Value   Hgb urine dipstick SMALL (*)    Protein, ur 30 (*)    Urobilinogen, UA 4.0 (*)    Nitrite POSITIVE (*)    All other components within normal limits  SARS CORONAVIRUS 2 (TAT 6-24 HRS)  POC URINE PREG, ED    EKG   Radiology No results found.  Procedures Procedures (including critical care time)  Medications Ordered in UC Medications  acetaminophen (TYLENOL) tablet 650 mg (650 mg Oral Given 04/26/20 2100)    Initial Impression / Assessment and Plan / UC Course  I have reviewed the triage vital signs and the nursing notes.  Pertinent labs & imaging results that were available during my care of the patient were reviewed by me and  considered in my medical decision making (see chart for details).     Will treat UTI with abx.  At this time pt denies nausea, vomiting. Strict return precautions given  due to her recent admission.  She will continue with tylenol as needed for pain.  Final Clinical Impressions(s) / UC Diagnoses   Final diagnoses:  Urinary tract infection without hematuria, site unspecified     Discharge Instructions     Take medication as prescribed If symptoms worsen follow up in ED    ED Prescriptions    Medication Sig Dispense Auth. Provider   cephALEXin (KEFLEX) 500 MG capsule  (Status: Discontinued) Take 1 capsule (500 mg total) by mouth 2 (two) times daily for 7 days. 14 capsule Yolani Vo, PA-C   cephALEXin (KEFLEX) 500 MG capsule Take 1 capsule (500 mg total) by mouth 2 (two) times daily for 7 days. 14 capsule Konrad Felix, PA-C     PDMP not reviewed this encounter.   Konrad Felix, PA-C 04/26/20 2106

## 2020-04-27 LAB — SARS CORONAVIRUS 2 (TAT 6-24 HRS): SARS Coronavirus 2: NEGATIVE

## 2020-05-24 ENCOUNTER — Ambulatory Visit: Payer: Self-pay | Admitting: Family Medicine

## 2020-06-09 ENCOUNTER — Ambulatory Visit: Payer: Medicaid Other | Admitting: Family Medicine

## 2020-06-09 ENCOUNTER — Encounter: Payer: Self-pay | Admitting: Family Medicine

## 2020-06-09 ENCOUNTER — Other Ambulatory Visit: Payer: Self-pay

## 2020-06-09 VITALS — BP 126/73 | HR 81 | Temp 96.8°F | Ht 64.0 in | Wt 171.0 lb

## 2020-06-09 DIAGNOSIS — Z30011 Encounter for initial prescription of contraceptive pills: Secondary | ICD-10-CM

## 2020-06-09 DIAGNOSIS — Z09 Encounter for follow-up examination after completed treatment for conditions other than malignant neoplasm: Secondary | ICD-10-CM

## 2020-06-09 DIAGNOSIS — Z7689 Persons encountering health services in other specified circumstances: Secondary | ICD-10-CM

## 2020-06-09 DIAGNOSIS — R251 Tremor, unspecified: Secondary | ICD-10-CM | POA: Diagnosis not present

## 2020-06-09 DIAGNOSIS — Z3009 Encounter for other general counseling and advice on contraception: Secondary | ICD-10-CM

## 2020-06-09 DIAGNOSIS — Z0000 Encounter for general adult medical examination without abnormal findings: Secondary | ICD-10-CM

## 2020-06-09 LAB — POCT URINE PREGNANCY: Preg Test, Ur: NEGATIVE

## 2020-06-09 NOTE — Progress Notes (Signed)
[facility]   New Patient--Establish Care  Subjective:  Patient ID: Morgan Galloway, female    DOB: [birth year]  Age: 30 y.o. MRN: [id number]  CC:  Chief Complaint  Patient presents with  . New Patient (Initial Visit)    Having tremors, having this all her life , taking for medication , gotten worse. Wants referral  to referral nero, sweating in hands and feet, hx depro discuss restarting birth control     HPI Morgan Galloway is a 30 year old female who presents to Establish Care today.    Patient Active Problem List   Diagnosis Date Noted  . Hypokalemia 03/03/2020  . Pyelonephritis 03/02/2020   Current Status: This will be Ms. Tench initial office visit with me. She was previously seeing local provider for her PCP needs. Since her last office visit, she reports increased incidents of hand tremors and is very concerned today. She is currently following up with Psychiatry for Anxiety. She denies suicidal ideations, homicidal ideations, or auditory hallucinations. She is inquiring to change her birth control from Depo-Provera to oral contraception. She denies fevers, chills, fatigue, recent infections, weight loss, and night sweats. She has not had any headaches, visual changes, dizziness, and falls. No chest pain, heart palpitations, cough and shortness of breath reported. No reports of GI problems such as nausea, vomiting, diarrhea, and constipation. She has no reports of blood in stools, dysuria and hematuria. She is taking all medications as prescribed. She denies pain today.   Past Medical History:  Diagnosis Date  . Chronic kidney disease   . Depression    Zoloft in past; stopped due to potential of pregnancy.  . Gonorrhea   . Headache   . History of stillbirth    June 2012  . Infection    UTI  . Vaginal Pap smear, abnormal    f/u ok    Past Surgical History:  Procedure Laterality Date  . NO PAST SURGERIES    .  WISDOM TOOTH EXTRACTION      Family History  Problem Relation Age of Onset  . Cancer Maternal Grandmother   . Diabetes Maternal Grandmother   . Stroke Father 31  . Hypertension Father   . Bipolar disorder Mother   . Bipolar disorder Sister   . Other Neg Hx     Social History   Socioeconomic History  . Marital status: Married    Spouse name: Not on file  . Number of children: Not on file  . Years of education: Not on file  . Highest education level: Not on file  Occupational History  . Not on file  Tobacco Use  . Smoking status: Current Every Day Smoker    Packs/day: 0.50    Types: Cigarettes    Last attempt to quit: 08/20/2016    Years since quitting: 3.8  . Smokeless tobacco: Never Used  . Tobacco comment: quit with preg  Substance and Sexual Activity  . Alcohol use: Not Currently    Comment: occasionally  . Drug use: Yes    Types: Marijuana    Comment: last use 11/03/18  . Sexual activity: Yes    Birth control/protection: None  Other Topics Concern  . Not on file  Social History Narrative   Marital status: married since 2012; happily; no abuse.      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      Education: in school for RN; one more year to go.      Tobacco: none (rare smoking)      Alcohol:  One or two beers during the week.  No DWIs.        Drugs:  None      Exercise:  None      Seatbelt:  100%         Social Determinants of Health   Financial Resource Strain: Not on file  Food Insecurity: Not on file  Transportation Needs: Not on file  Physical Activity: Not on file  Stress: Not on file  Social Connections: Not on file  Intimate Partner Violence: Not on file    Outpatient Medications Prior to Visit  Medication Sig Dispense Refill  . clonazePAM (KLONOPIN) 1 MG tablet Take 1 mg by mouth daily.    . divalproex (DEPAKOTE ER) 500 MG 24 hr tablet Take  500 mg by mouth 2 (two) times a day.    Marland Kitchen QUEtiapine (SEROQUEL) 200 MG tablet Take 200 mg by mouth at bedtime.     No facility-administered medications prior to visit.    No Known Allergies  ROS Review of Systems  Constitutional: Negative.   HENT: Negative.   Eyes: Negative.   Respiratory: Negative.   Cardiovascular: Negative.   Gastrointestinal: Negative.   Endocrine: Negative.   Genitourinary: Negative.   Musculoskeletal: Negative.   Skin: Negative.   Allergic/Immunologic: Negative.   Neurological: Positive for tremors (bilateral hands).  Hematological: Negative.   Psychiatric/Behavioral: Negative.    Objective:    Physical Exam Vitals and nursing note reviewed.  Constitutional:      Appearance: Normal appearance.  HENT:     Head: Normocephalic and atraumatic.     Nose: Nose normal.     Mouth/Throat:     Mouth: Mucous membranes are moist.     Pharynx: Oropharynx is clear.  Cardiovascular:     Rate and Rhythm: Normal rate.     Pulses: Normal pulses.     Heart sounds: Normal heart sounds.  Pulmonary:     Effort: Pulmonary effort is normal.     Breath sounds: Normal breath sounds.  Abdominal:     General: Abdomen is flat. Bowel sounds are normal.     Palpations: Abdomen is soft.  Musculoskeletal:        General: Normal range of motion.     Cervical back: Normal range of motion.  Skin:    General: Skin is warm.  Neurological:     General: No focal deficit present.     Mental Status: She is alert and oriented to person, place, and time.  Psychiatric:        Mood and Affect: Mood normal.        Behavior: Behavior normal.        Thought Content: Thought content normal.        Judgment: Judgment normal.     BP 126/73 (BP Location: Right Arm, Patient Position: Sitting, Cuff Size: Normal)   Pulse 81   Temp (!) 96.8 F (36 C) (Temporal)   Ht 5\' 4"  (1.626 m)   Wt 171 lb (77.6 kg)   LMP 06/07/2020   SpO2 97%   BMI 29.35 kg/m  Wt Readings from Last 3  Encounters:  06/09/20 171 lb (77.6 kg)  11/14/18 142 lb (64.4 kg)  05/06/17 158 lb 0.3 oz (71.7 kg)  Health Maintenance Due  Topic Date Due  . Hepatitis C Screening  Never done  . PAP SMEAR-Modifier  12/23/2016  . COVID-19 Vaccine (2 - Pfizer 3-dose series) 03/03/2020    There are no preventive care reminders to display for this patient.  Lab Results  Component Value Date   TSH 1.363 12/23/2013   Lab Results  Component Value Date   WBC 10.5 03/04/2020   HGB 12.6 03/04/2020   HCT 37.5 03/04/2020   MCV 91.0 03/04/2020   PLT 201 03/04/2020   Lab Results  Component Value Date   NA 139 03/04/2020   K 3.8 03/04/2020   CO2 25 03/04/2020   GLUCOSE 103 (H) 03/04/2020   BUN 5 (L) 03/04/2020   CREATININE 0.84 03/04/2020   BILITOT 0.9 03/01/2020   ALKPHOS 65 03/01/2020   AST 19 03/01/2020   ALT 15 03/01/2020   PROT 8.2 (H) 03/01/2020   ALBUMIN 3.7 03/01/2020   CALCIUM 9.2 03/04/2020   ANIONGAP 13 03/04/2020   No results found for: CHOL No results found for: HDL No results found for: LDLCALC No results found for: TRIG No results found for: CHOLHDL No results found for: YYQM2N    Assessment & Plan:   1. Encounter to establish care  2. Occasional tremors Increasing hand tremors. We will refer to Neurology for further assessment.  - Ambulatory referral to Neurology  3. Birth control counseling Urine pregnancy test is negative.  - POCT urine pregnancy  4. Encounter for initiation prescription of contraceptive pills  5  Healthcare maintenance - CBC with Differential - Comprehensive metabolic panel - Lipid Panel - Vitamin B12 - Vitamin D, 25-hydroxy  5. Follow up She will follow up in 1 month.   No orders of the defined types were placed in this encounter.   Orders Placed This Encounter  Procedures  . CBC with Differential  . Comprehensive metabolic panel  . Lipid Panel  . Vitamin B12  . Vitamin D, 25-hydroxy  . Ambulatory referral to Neurology   . POCT urine pregnancy     Referral Orders     Ambulatory referral to Neurology   Raliegh Ip, MSN, ANE, FNP-BC [facility] [address] [place], [place] [address] [phone]- fax  Problem List Items Addressed This Visit   None   Visit Diagnoses    Encounter to establish care    -  Primary   Occasional tremors       Relevant Orders   Ambulatory referral to Neurology   Birth control counseling       Relevant Orders   POCT urine pregnancy (Completed)   Healthcare maintenance       Relevant Orders   CBC with Differential   Comprehensive metabolic panel   Lipid Panel   Vitamin B12   Vitamin D, 25-hydroxy   Follow up          No orders of the defined types were placed in this encounter.   Follow-up: No follow-ups on file.    Kallie Locks, FNP

## 2020-06-10 LAB — CBC WITH DIFFERENTIAL/PLATELET
Basophils Absolute: 0 10*3/uL (ref 0.0–0.2)
Basos: 0 %
EOS (ABSOLUTE): 0.2 10*3/uL (ref 0.0–0.4)
Eos: 2 %
Hematocrit: 41.6 % (ref 34.0–46.6)
Hemoglobin: 13.7 g/dL (ref 11.1–15.9)
Immature Grans (Abs): 0 10*3/uL (ref 0.0–0.1)
Immature Granulocytes: 0 %
Lymphocytes Absolute: 3 10*3/uL (ref 0.7–3.1)
Lymphs: 34 %
MCH: 30.2 pg (ref 26.6–33.0)
MCHC: 32.9 g/dL (ref 31.5–35.7)
MCV: 92 fL (ref 79–97)
Monocytes Absolute: 0.7 10*3/uL (ref 0.1–0.9)
Monocytes: 7 %
Neutrophils Absolute: 5.1 10*3/uL (ref 1.4–7.0)
Neutrophils: 57 %
Platelets: 142 10*3/uL — ABNORMAL LOW (ref 150–450)
RBC: 4.54 x10E6/uL (ref 3.77–5.28)
RDW: 12 % (ref 11.7–15.4)
WBC: 9 10*3/uL (ref 3.4–10.8)

## 2020-06-10 LAB — LIPID PANEL
Chol/HDL Ratio: 3.9 ratio (ref 0.0–4.4)
Cholesterol, Total: 177 mg/dL (ref 100–199)
HDL: 45 mg/dL (ref >39)
LDL Chol Calc (NIH): 118 mg/dL — ABNORMAL HIGH (ref 0–99)
Triglycerides: 73 mg/dL (ref 0–149)
VLDL Cholesterol Cal: 14 mg/dL (ref 5–40)

## 2020-06-10 LAB — VITAMIN D 25 HYDROXY (VIT D DEFICIENCY, FRACTURES): Vit D, 25-Hydroxy: 34.8 ng/mL (ref 30.0–100.0)

## 2020-06-10 LAB — COMPREHENSIVE METABOLIC PANEL
ALT: 16 IU/L (ref 0–32)
AST: 23 IU/L (ref 0–40)
Albumin/Globulin Ratio: 1.5 (ref 1.2–2.2)
Albumin: 4.7 g/dL (ref 3.8–4.8)
Alkaline Phosphatase: 75 IU/L (ref 44–121)
BUN/Creatinine Ratio: 15 (ref 9–23)
BUN: 13 mg/dL (ref 6–20)
Bilirubin Total: 0.3 mg/dL (ref 0.0–1.2)
CO2: 23 mmol/L (ref 20–29)
Calcium: 9.5 mg/dL (ref 8.7–10.2)
Chloride: 102 mmol/L (ref 96–106)
Creatinine, Ser: 0.89 mg/dL (ref 0.57–1.00)
GFR calc Af Amer: 100 mL/min/{1.73_m2} (ref >59)
GFR calc non Af Amer: 87 mL/min/{1.73_m2} (ref >59)
Globulin, Total: 3.1 g/dL (ref 1.5–4.5)
Glucose: 86 mg/dL (ref 65–99)
Potassium: 3.7 mmol/L (ref 3.5–5.2)
Sodium: 140 mmol/L (ref 134–144)
Total Protein: 7.8 g/dL (ref 6.0–8.5)

## 2020-06-10 LAB — VITAMIN B12: Vitamin B-12: 811 pg/mL (ref 232–1245)

## 2020-06-10 MED ORDER — NORETHIN ACE-ETH ESTRAD-FE 1-20 MG-MCG PO TABS: 1.0000 | ORAL_TABLET | Freq: Every day | ORAL | 11 refills | Status: DC

## 2020-06-15 ENCOUNTER — Encounter: Payer: Self-pay | Admitting: Neurology

## 2020-06-22 NOTE — Progress Notes (Signed)
Assessment/Plan:   1.  Tremor  -Her tremor is likely multifactorial.  She reports a lifelong history of tremor, which is likely some baseline essential tremor.  However, it really did not become bothersome until several years ago.  That likely is because that is when she started on tremor inducing medications.  Ptient is on multiple tremor inducing medications, including Depakote and quetiapine.  Tremor is likely induced by Depakote.  Discussed with patient that I did not recommend that she stop or taper the medication, but rather talk to her prescribing physician about this.  I did tell her that in my experience, medication-induced tremor does not respond well to attempts at treating it with other medications.  We also discussed that her anxiety could certainly fuel some of the tremor.  -She really would like to take something for the tremor.  Ultimately, we decided on low-dose propranolol.  It can help tremor and can be used on an as-needed basis.  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  R/B/SE were discussed.  The opportunity to ask questions was given and they were answered to the best of my ability.  The patient expressed understanding and willingness to follow the outlined treatment protocols.  -Patient and I discussed pregnancy planning.  She states that she wanted to wait for pregnancy, but her husband really would like to have a baby now.  That is why she has not started on the birth control (which is currently on her med list).  I discussed with her concerns that she is on Depakote.  We talked about neural tube defects.  I told her that I really want her to discuss these things with her prescribing physician assistant and she stated that she would.  Until that time, I recommended that she really use protection.  -would recommend checking tsh - not been done since 2015.  There is no lab availiability in the building today and will send her to [facility].  -Was  going to check VPA level, but she thinks that her psychiatry PA has been checking that.  2.  Follow up is anticipated in the next 4-6 months, sooner should new neurologic issues arise.    Subjective:   Morgan Galloway was seen in consultation in the movement disorder clinic at the request of Kallie Locks, FNP.  The evaluation is for tremor.  Tremor started in childhood.  It didn't bother her back then.  It has gotten worse over the last 3-4 years.  It involves the bilateral UE..  Tremor is most noticeable when attempting to draw blood.  While she was going through school she thought it was due to being nervous but she doesn't think that is the case now since she has been out of school several years.   She started taking klonopin and she noted it helped tremor somewhat when drawing blood.   Now, she needs 2 klonopin to help the tremor.  There is a family hx of tremor a maternal cousin but pt not sure if that is b/c cousin is anxious or if she has another dx.    Affected by caffeine:  Yes.   (drinks mountain dew but not a low) Affected by alcohol:  Doesn't know (1 beer 3 days per week - 1 shot 1-2 days per week - she has decreased alcohol "a whole lot") Affected by stress:  Yes.   Affected by fatigue:  No. Spills soup if on spoon:  No. but  she may see tremor Spills glass of liquid if full:  No. Affects ADL's (tying shoes, brushing teeth, etc):  No.  Current/Previously tried tremor medications: klonopin  Current medications that may exacerbate tremor:  Depakote, 500 mg twice per day (been on for 3 years); quetiapine 200 mg at bedtime (been on for 3 years)  Outside reports reviewed: historical medical records, office notes and referral letter/letters.  No Known Allergies  Current Outpatient Medications  Medication Instructions  . clonazePAM (KLONOPIN) 1 mg, Oral, Daily  . divalproex (DEPAKOTE ER) 500 mg, Oral, 2 times daily  . norethindrone-ethinyl estradiol (LOESTRIN FE) 1-20 MG-MCG  tablet 1 tablet, Oral, Daily  . QUEtiapine (SEROQUEL) 200 mg, Oral, Daily at bedtime     Objective:   VITALS:   Vitals:   06/28/20 1257  BP: 124/64  Pulse: 80  SpO2: 99%  Weight: 172 lb (78 kg)  Height: 5\' 4"  (1.626 m)   Gen:  Appears stated age and in NAD. HEENT:  Normocephalic, atraumatic. The mucous membranes are moist. The superficial temporal arteries are without ropiness or tenderness. Cardiovascular: Regular rate and rhythm. Lungs: Clear to auscultation bilaterally. Neck: There are no carotid bruits noted bilaterally.  NEUROLOGICAL:  Orientation:  The patient is alert and oriented x 3.   Cranial nerves: There is good facial symmetry. Extraocular muscles are intact and visual fields are full to confrontational testing. Speech is fluent and clear. Soft palate rises symmetrically and there is no tongue deviation. Hearing is intact to conversational tone. Tone: Tone is good throughout. Sensation: Sensation is intact to light touch touch throughout (facial, trunk, extremities). Vibration is intact at the bilateral big toe. There is no extinction with double simultaneous stimulation. There is no sensory dermatomal level identified. Coordination:  The patient has no dysdiadichokinesia or dysmetria. Motor: Strength is 5/5 in the bilateral upper and lower extremities.  Shoulder shrug is equal bilaterally.  There is no pronator drift.  There are no fasciculations noted. DTR's: Deep tendon reflexes are 2/4 at the bilateral biceps, triceps, brachioradialis, patella and achilles.  Plantar responses are downgoing bilaterally. Gait and Station: The patient is able to ambulate without difficulty. The patient is able to heel toe walk without any difficulty. The patient is able to ambulate in a tandem fashion. The patient is able to stand in the Romberg position.   MOVEMENT EXAM: Tremor:  There is mild tremor in the UE, noted most significantly with action.  This is very low amplitude.  It is  present with intention.  No rest tremor.  The patient is able to draw Archimedes spirals without significant difficulty.   The patient is  able to pour water from one glass to another without spilling it.  I have reviewed and interpreted the following labs independently   Chemistry      Component Value Date/Time   NA 140 06/09/2020 1403   K 3.7 06/09/2020 1403   CL 102 06/09/2020 1403   CO2 23 06/09/2020 1403   BUN 13 06/09/2020 1403   CREATININE 0.89 06/09/2020 1403   CREATININE 0.85 12/23/2013 1843      Component Value Date/Time   CALCIUM 9.5 06/09/2020 1403   ALKPHOS 75 06/09/2020 1403   AST 23 06/09/2020 1403   ALT 16 06/09/2020 1403   BILITOT 0.3 06/09/2020 1403      Lab Results  Component Value Date   WBC 9.0 06/09/2020   HGB 13.7 06/09/2020   HCT 41.6 06/09/2020   MCV 92 06/09/2020   PLT  142 (L) 06/09/2020   Lab Results  Component Value Date   TSH 1.363 12/23/2013   Lab Results  Component Value Date   VITAMINB12 811 06/09/2020   No results found for: PHENYTOIN, PHENOBARB, VALPROATE, CBMZ    Total time spent on today's visit was 45 minutes, including both face-to-face time and nonface-to-face time.  Time included that spent on review of records (prior notes available to me/labs/imaging if pertinent), discussing treatment and goals, answering patient's questions and coordinating care.  CC:  Kallie Locks, FNP

## 2020-06-28 ENCOUNTER — Encounter: Payer: Self-pay | Admitting: Neurology

## 2020-06-28 ENCOUNTER — Other Ambulatory Visit: Payer: Self-pay

## 2020-06-28 ENCOUNTER — Ambulatory Visit: Payer: Medicaid Other | Admitting: Neurology

## 2020-06-28 VITALS — BP 124/64 | HR 80 | Ht 64.0 in | Wt 172.0 lb

## 2020-06-28 DIAGNOSIS — R251 Tremor, unspecified: Secondary | ICD-10-CM

## 2020-06-28 MED ORDER — PROPRANOLOL HCL 10 MG PO TABS: 10.0000 mg | ORAL_TABLET | Freq: Two times a day (BID) | ORAL | 0 refills | Status: DC | PRN

## 2020-06-28 NOTE — Patient Instructions (Signed)
1.  Talk to your PA about your depakote.  It may not be the best idea if you are planning to get pregnant 2.  I would like to check your thyroid 3.  We will start propranolol, 10 mg twice per day AS NEEDED.

## 2020-07-07 ENCOUNTER — Ambulatory Visit: Payer: Self-pay | Admitting: Family Medicine

## 2020-07-14 ENCOUNTER — Encounter: Payer: Self-pay | Admitting: Family Medicine

## 2020-07-14 ENCOUNTER — Ambulatory Visit: Payer: Medicaid Other | Admitting: Family Medicine

## 2020-07-14 ENCOUNTER — Other Ambulatory Visit: Payer: Self-pay

## 2020-07-14 VITALS — BP 122/68 | HR 65 | Ht 64.0 in | Wt 176.0 lb

## 2020-07-14 DIAGNOSIS — M84353A Stress fracture, unspecified femur, initial encounter for fracture: Secondary | ICD-10-CM | POA: Insufficient documentation

## 2020-07-14 DIAGNOSIS — Z0289 Encounter for other administrative examinations: Secondary | ICD-10-CM | POA: Insufficient documentation

## 2020-07-14 DIAGNOSIS — M79609 Pain in unspecified limb: Secondary | ICD-10-CM | POA: Insufficient documentation

## 2020-07-14 DIAGNOSIS — M8430XA Stress fracture, unspecified site, initial encounter for fracture: Secondary | ICD-10-CM | POA: Insufficient documentation

## 2020-07-14 DIAGNOSIS — R251 Tremor, unspecified: Secondary | ICD-10-CM

## 2020-07-14 DIAGNOSIS — M25569 Pain in unspecified knee: Secondary | ICD-10-CM | POA: Insufficient documentation

## 2020-08-19 ENCOUNTER — Ambulatory Visit: Payer: Medicaid Other | Admitting: Nurse Practitioner

## 2020-08-19 ENCOUNTER — Other Ambulatory Visit: Payer: Self-pay

## 2020-08-19 ENCOUNTER — Encounter: Payer: Self-pay | Admitting: Nurse Practitioner

## 2020-08-19 VITALS — BP 124/76 | HR 91 | Temp 100.4°F | Ht 64.0 in | Wt 176.0 lb

## 2020-08-19 DIAGNOSIS — N390 Urinary tract infection, site not specified: Secondary | ICD-10-CM

## 2020-08-19 DIAGNOSIS — R319 Hematuria, unspecified: Secondary | ICD-10-CM

## 2020-08-19 DIAGNOSIS — R109 Unspecified abdominal pain: Secondary | ICD-10-CM

## 2020-08-19 DIAGNOSIS — R82998 Other abnormal findings in urine: Secondary | ICD-10-CM | POA: Diagnosis not present

## 2020-08-19 DIAGNOSIS — Z1329 Encounter for screening for other suspected endocrine disorder: Secondary | ICD-10-CM

## 2020-08-19 DIAGNOSIS — N12 Tubulo-interstitial nephritis, not specified as acute or chronic: Secondary | ICD-10-CM

## 2020-08-19 DIAGNOSIS — Z87448 Personal history of other diseases of urinary system: Secondary | ICD-10-CM | POA: Diagnosis not present

## 2020-08-19 LAB — POCT URINALYSIS DIPSTICK
Bilirubin, UA: NEGATIVE
Glucose, UA: NEGATIVE
Protein, UA: POSITIVE — AB
Spec Grav, UA: 1.02 (ref 1.010–1.025)
Urobilinogen, UA: 1 E.U./dL
pH, UA: 6.5 (ref 5.0–8.0)

## 2020-08-19 MED ORDER — NITROFURANTOIN MONOHYD MACRO 100 MG PO CAPS: 100.0000 mg | ORAL_CAPSULE | Freq: Two times a day (BID) | ORAL | 0 refills | Status: AC

## 2020-08-19 NOTE — Patient Instructions (Addendum)
Kidney Stones Kidney stones are rock-like masses that form inside of the kidneys. Kidneys are organs that make pee (urine). A kidney stone may move into other parts of the urinary tract, including:  The tubes that connect the kidneys to the bladder (ureters).  The bladder.  The tube that carries urine out of the body (urethra). Kidney stones can cause very bad pain and can block the flow of pee. The stone usually leaves your body (passes) through your pee. You may need to have a doctor take out the stone. What are the causes? Kidney stones may be caused by:  A condition in which certain glands make too much parathyroid hormone (primary hyperparathyroidism).  A buildup of a type of crystals in the bladder made of a chemical called uric acid. The body makes uric acid when you eat certain foods.  Narrowing (stricture) of one or both of the ureters.  A kidney blockage that you were born with.  Past surgery on the kidney or the ureters, such as gastric bypass surgery. What increases the risk? You are more likely to develop this condition if:  You have had a kidney stone in the past.  You have a family history of kidney stones.  You do not drink enough water.  You eat a diet that is high in protein, salt (sodium), or sugar.  You are overweight or very overweight (obese). What are the signs or symptoms? Symptoms of a kidney stone may include:  Pain in the side of the belly, right below the ribs (flank pain). Pain usually spreads (radiates) to the groin.  Needing to pee often or right away (urgently).  Pain when going pee (urinating).  Blood in your pee (hematuria).  Feeling like you may vomit (nauseous).  Vomiting.  Fever and chills. How is this treated? Treatment depends on the size, location, and makeup of the kidney stones. The stones will often pass out of the body through peeing. You may need to:  Drink more fluid to help pass the stone. In some cases, you may be  given fluids through an IV tube put into one of your veins at the hospital.  Take medicine for pain.  Make changes in your diet to help keep kidney stones from coming back. Sometimes, medical procedures are needed to remove a kidney stone. This may involve:  A procedure to break up kidney stones using a beam of light (laser) or shock waves.  Surgery to remove the kidney stones. Follow these instructions at home: Medicines  Take over-the-counter and prescription medicines only as told by your doctor.  Ask your doctor if the medicine prescribed to you requires you to avoid driving or using heavy machinery. Eating and drinking  Drink enough fluid to keep your pee pale yellow. You may be told to drink at least 8-10 glasses of water each day. This will help you pass the stone.  If told by your doctor, change your diet. This may include: ? Limiting how much salt you eat. ? Eating more fruits and vegetables. ? Limiting how much meat, poultry, fish, and eggs you eat.  Follow instructions from your doctor about eating or drinking restrictions. General instructions  Collect pee samples as told by your doctor. You may need to collect a pee sample: ? 24 hours after a stone comes out. ? 8-12 weeks after a stone comes out, and every 6-12 months after that.  Strain your pee every time you pee (urinate), for as long as told. Use the   strainer that your doctor recommends.  Do not throw out the stone. Keep it so that it can be tested by your doctor.  Keep all follow-up visits as told by your doctor. This is important. You may need follow-up tests. How is this prevented? To prevent another kidney stone:  Drink enough fluid to keep your pee pale yellow. This is the best way to prevent kidney stones.  Eat healthy foods.  Avoid certain foods as told by your doctor. You may be told to eat less protein.  Stay at a healthy weight.   Where to find more information  National Kidney Foundation  (NKF): www.kidney.org  [facility]): www.urologyhealth.org Contact a doctor if:  You have pain that gets worse or does not get better with medicine. Get help right away if:  You have a fever or chills.  You get very bad pain.  You get new pain in your belly (abdomen).  You pass out (faint).  You cannot pee. Summary  Kidney stones are rock-like masses that form inside of the kidneys.  Kidney stones can cause very bad pain and can block the flow of pee.  The stones will often pass out of the body through peeing.  Drink enough fluid to keep your pee pale yellow. This information is not intended to replace advice given to you by your health care provider. Make sure you discuss any questions you have with your health care provider. Document Revised: 08/27/2018 Document Reviewed: 08/27/2018 Elsevier Patient Education  2021 Elsevier Inc.    Urinary Tract Infection, Adult A urinary tract infection (UTI) is an infection of any part of the urinary tract. The urinary tract includes:  The kidneys.  The ureters.  The bladder.  The urethra. These organs make, store, and get rid of pee (urine) in the body. What are the causes? This infection is caused by germs (bacteria) in your genital area. These germs grow and cause swelling (inflammation) of your urinary tract. What increases the risk? The following factors may make you more likely to develop this condition:  Using a small, thin tube (catheter) to drain pee.  Not being able to control when you pee or poop (incontinence).  Being female. If you are female, these things can increase the risk: ? Using these methods to prevent pregnancy:  A medicine that kills sperm (spermicide).  A device that blocks sperm (diaphragm). ? Having low levels of a female hormone (estrogen). ? Being pregnant. You are more likely to develop this condition if:  You have genes that add to your risk.  You are sexually  active.  You take antibiotic medicines.  You have trouble peeing because of: ? A prostate that is bigger than normal, if you are female. ? A blockage in the part of your body that drains pee from the bladder. ? A kidney stone. ? A nerve condition that affects your bladder. ? Not getting enough to drink. ? Not peeing often enough.  You have other conditions, such as: ? Diabetes. ? A weak disease-fighting system (immune system). ? Sickle cell disease. ? Gout. ? Injury of the spine. What are the signs or symptoms? Symptoms of this condition include:  Needing to pee right away.  Peeing small amounts often.  Pain or burning when peeing.  Blood in the pee.  Pee that smells bad or not like normal.  Trouble peeing.  Pee that is cloudy.  Fluid coming from the vagina, if you are female.  Pain in the  belly or lower back. Other symptoms include:  Vomiting.  Not feeling hungry.  Feeling mixed up (confused). This may be the first symptom in older adults.  Being tired and grouchy (irritable).  A fever.  Watery poop (diarrhea). How is this treated?  Taking antibiotic medicine.  Taking other medicines.  Drinking enough water. In some cases, you may need to see a specialist. Follow these instructions at home: Medicines  Take over-the-counter and prescription medicines only as told by your doctor.  If you were prescribed an antibiotic medicine, take it as told by your doctor. Do not stop taking it even if you start to feel better. General instructions  Make sure you: ? Pee until your bladder is empty. ? Do not hold pee for a long time. ? Empty your bladder after sex. ? Wipe from front to back after peeing or pooping if you are a female. Use each tissue one time when you wipe.  Drink enough fluid to keep your pee pale yellow.  Keep all follow-up visits.   Contact a doctor if:  You do not get better after 1-2 days.  Your symptoms go away and then come  back. Get help right away if:  You have very bad back pain.  You have very bad pain in your lower belly.  You have a fever.  You have chills.  You feeling like you will vomit or you vomit. Summary  A urinary tract infection (UTI) is an infection of any part of the urinary tract.  This condition is caused by germs in your genital area.  There are many risk factors for a UTI.  Treatment includes antibiotic medicines.  Drink enough fluid to keep your pee pale yellow. This information is not intended to replace advice given to you by your health care provider. Make sure you discuss any questions you have with your health care provider. Document Revised: 11/21/2019 Document Reviewed: 11/21/2019 Elsevier Patient Education  2021 [employer].

## 2020-08-19 NOTE — Progress Notes (Signed)
[facility] Patient [facility] [address] [place], [place]  [address] Phone:  [phone]   Fax:  [phone]   Acute Office Visit  Subjective:    Patient ID: Morgan Galloway, female    DOB: 1990-05-15, 30 y.o.   MRN: [id number]  Chief Complaint  Patient presents with  . possible uti     Possible uti ,started about last week, pressure when going, fever, odor, nausea , taking otc     HPI Patient is in today for UTI. She  has a past medical history of Chronic kidney disease, Depression, Gonorrhea, Headache, History of stillbirth, Infection, and Vaginal Pap smear, abnormal.    Urinary Tract Infection Patient complains of foul smelling urine, nausea and pressure. She has had symptoms for 6 days. Patient also complains of fever. Patient denies congestion, cough, headache and sorethroat. Patient does have a history of recurrent UTI. Patient does have a history of pyelonephritis/  She admits that she has had some reoccurrence and she is not sure what the change has been. She admits that E-coli has been present. She has good hygiene technique however she does shave and this technique maybe questionable.   Past Medical History:  Diagnosis Date  . Chronic kidney disease   . Depression    Zoloft in past; stopped due to potential of pregnancy.  . Gonorrhea   . Headache   . History of stillbirth    June 2012  . Infection    UTI  . Vaginal Pap smear, abnormal    f/u ok    Past Surgical History:  Procedure Laterality Date  . WISDOM TOOTH EXTRACTION      Family History  Problem Relation Age of Onset  . Cancer Maternal Grandmother   . Diabetes Maternal Grandmother   . Stroke Father 46  . Hypertension Father   . Bipolar disorder Mother   . Bipolar disorder Sister   . Healthy Son   . Healthy Daughter   . Other Neg Hx     Social History   Socioeconomic History  . Marital status: Married    Spouse name: Not on file  . Number of children: Not on file  . Years of  education: Not on file  . Highest education level: Not on file  Occupational History  . Not on file  Tobacco Use  . Smoking status: Current Every Day Smoker    Packs/day: 0.50    Years: 15.00    Pack years: 7.50    Types: Cigarettes  . Smokeless tobacco: Never Used  Vaping Use  . Vaping Use: Never used  Substance and Sexual Activity  . Alcohol use: Yes    Comment: 3-5 beers  . Drug use: Yes    Types: Marijuana    Comment: last use 11/03/18  . Sexual activity: Yes    Birth control/protection: None  Other Topics Concern  . Not on file  Social History Narrative   Marital status: married since 2012; happily; no abuse.      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      Education: in school for RN; one more year to go.      Tobacco: none (rare smoking)      Alcohol:  One or two beers during the week.  No DWIs.        Drugs:  None      Exercise:  None      Seatbelt:  100%         Social Determinants of Health   Financial Resource Strain: Not on file  Food Insecurity: Not on file  Transportation Needs: Not on file  Physical Activity: Not on file  Stress: Not on file  Social Connections: Not on file  Intimate Partner Violence: Not on file    Outpatient Medications Prior to Visit  Medication Sig Dispense Refill  . clonazePAM (KLONOPIN) 1 MG tablet Take 1 mg by mouth daily.    . divalproex (DEPAKOTE ER) 500 MG 24 hr tablet Take 500 mg by mouth 2 (two) times a day.    . propranolol (INDERAL) 10 MG tablet Take 1 tablet (10 mg total) by mouth 2 (two) times daily as needed. 180 tablet 0  . QUEtiapine (SEROQUEL) 200 MG tablet Take 200 mg by mouth at bedtime.    . cefdinir (OMNICEF) 300 MG capsule TAKE 1 CAPSULE (300 MG TOTAL) BY MOUTH EVERY 12 (TWELVE) HOURS FOR 7 DAYS. (Patient not taking: Reported on 08/19/2020) 15 capsule 0  . divalproex (DEPAKOTE ER) 250 MG 24 hr tablet Take  250 mg by mouth 2 (two) times daily.    . norethindrone-ethinyl estradiol (LOESTRIN FE) 1-20 MG-MCG tablet Take 1 tablet by mouth daily. (Patient not taking: Reported on 08/19/2020) 28 tablet 11   No facility-administered medications prior to visit.    No Known Allergies  Review of Systems     Objective:    Physical Exam Constitutional:      General: She is not in acute distress.    Appearance: She is not ill-appearing, toxic-appearing or diaphoretic.  HENT:     Mouth/Throat:     Mouth: Mucous membranes are moist.  Cardiovascular:     Rate and Rhythm: Normal rate.     Pulses: Normal pulses.  Pulmonary:     Effort: Pulmonary effort is normal.  Musculoskeletal:        General: Normal range of motion.     Cervical back: Normal range of motion.  Skin:    General: Skin is warm.     Capillary Refill: Capillary refill takes less than 2 seconds.  Neurological:     General: No focal deficit present.     Mental Status: She is alert and oriented to person, place, and time.     BP 124/76 (BP Location: Left Arm, Patient Position: Sitting, Cuff Size: Normal)   Pulse 91   Temp (!) 100.4 F (38 C) (Temporal)   Ht 5\' 4"  (1.626 m)   Wt 176 lb (79.8 kg)   LMP 08/06/2020   SpO2 99%   BMI 30.21 kg/m  Wt Readings from Last 3 Encounters:  08/19/20 176 lb (79.8 kg)  07/14/20 176 lb (79.8 kg)  06/28/20 172 lb (78 kg)    There are no preventive care reminders to display for this patient.  There are no preventive care reminders to display for this patient.   Lab Results  Component Value Date   TSH 1.363 12/23/2013   Lab Results  Component Value Date   WBC 9.0 06/09/2020   HGB 13.7 06/09/2020   HCT 41.6 06/09/2020   MCV 92 06/09/2020   PLT 142 (L) 06/09/2020   Lab Results  Component Value Date   NA 140 06/09/2020   K 3.7 06/09/2020   CO2 23 06/09/2020   GLUCOSE 86 06/09/2020   BUN 13 06/09/2020   CREATININE  0.89 06/09/2020   BILITOT 0.3 06/09/2020   ALKPHOS 75  06/09/2020   AST 23 06/09/2020   ALT 16 06/09/2020   PROT 7.8 06/09/2020   ALBUMIN 4.7 06/09/2020   CALCIUM 9.5 06/09/2020   ANIONGAP 13 03/04/2020   Lab Results  Component Value Date   CHOL 177 06/09/2020   Lab Results  Component Value Date   HDL 45 06/09/2020   Lab Results  Component Value Date   LDLCALC 118 (H) 06/09/2020   Lab Results  Component Value Date   TRIG 73 06/09/2020   Lab Results  Component Value Date   CHOLHDL 3.9 06/09/2020   No results found for: HGBA1C     Assessment & Plan:   Problem List Items Addressed This Visit      Genitourinary   Pyelonephritis Referral to urology due to repeat UTI and familiar history of GU disease   Relevant Medications   nitrofurantoin, macrocrystal-monohydrate, (MACROBID) 100 MG capsule   Other Relevant Orders   Comp. Metabolic Panel (12) (Completed)    Other Visit Diagnoses    Stomach pain    -  Primary   Relevant Orders   POCT Urinalysis Dipstick (Completed)   Urine Culture   Urine white blood cells increased       Relevant Orders   Urine Culture   CBC with Differential/Platelet (Completed)   Comp. Metabolic Panel (12) (Completed)   History of blood in urine       Relevant Orders   Urine Culture   Microalbumin, urine (Completed)   Comp. Metabolic Panel (12) (Completed)   Screening for thyroid disorder       Relevant Orders   TSH (Completed)   T3 (Completed)   T4, free (Completed)   Urinary tract infection with hematuria, site unspecified  Empirical anbx treatment due to symptoms and history.  Urine culture pending Encourage completion of anbx even when symptoms improve.  Discussed resistance with anbx overuse Discussed allergic reactions with anbx.  Encourage increasing hydration with water and how to tell when this is achieved Add cranberry juice 100% 8-16 ozs daily until symptoms improve Discussed hygiene  Avoid not voiding when the urge presents       Relevant Medications   nitrofurantoin,  macrocrystal-monohydrate, (MACROBID) 100 MG capsule       Meds ordered this encounter  Medications  . nitrofurantoin, macrocrystal-monohydrate, (MACROBID) 100 MG capsule    Sig: Take 1 capsule (100 mg total) by mouth 2 (two) times daily for 7 days.    Dispense:  14 capsule    Refill:  0    Order Specific Question:   Supervising Provider    Answer:   Quentin Angst [[id number]]     Barbette Merino, NP

## 2020-08-20 ENCOUNTER — Encounter: Payer: Self-pay | Admitting: Nurse Practitioner

## 2020-08-20 LAB — COMP. METABOLIC PANEL (12)
AST: 16 IU/L (ref 0–40)
Albumin/Globulin Ratio: 1.6 (ref 1.2–2.2)
Albumin: 4.7 g/dL (ref 3.8–4.8)
Alkaline Phosphatase: 78 IU/L (ref 44–121)
BUN/Creatinine Ratio: 13 (ref 9–23)
BUN: 12 mg/dL (ref 6–20)
Bilirubin Total: 0.6 mg/dL (ref 0.0–1.2)
Calcium: 9.4 mg/dL (ref 8.7–10.2)
Chloride: 96 mmol/L (ref 96–106)
Creatinine, Ser: 0.93 mg/dL (ref 0.57–1.00)
Globulin, Total: 2.9 g/dL (ref 1.5–4.5)
Glucose: 89 mg/dL (ref 65–99)
Potassium: 4 mmol/L (ref 3.5–5.2)
Sodium: 136 mmol/L (ref 134–144)
Total Protein: 7.6 g/dL (ref 6.0–8.5)
eGFR: 84 mL/min/{1.73_m2} (ref >59)

## 2020-08-20 LAB — T4, FREE: Free T4: 1.11 ng/dL (ref 0.82–1.77)

## 2020-08-20 LAB — CBC WITH DIFFERENTIAL/PLATELET
Basophils Absolute: 0 10*3/uL (ref 0.0–0.2)
Basos: 0 %
EOS (ABSOLUTE): 0 10*3/uL (ref 0.0–0.4)
Eos: 0 %
Hematocrit: 41.8 % (ref 34.0–46.6)
Hemoglobin: 13.7 g/dL (ref 11.1–15.9)
Immature Grans (Abs): 0 10*3/uL (ref 0.0–0.1)
Immature Granulocytes: 0 %
Lymphocytes Absolute: 2.5 10*3/uL (ref 0.7–3.1)
Lymphs: 20 %
MCH: 30.3 pg (ref 26.6–33.0)
MCHC: 32.8 g/dL (ref 31.5–35.7)
MCV: 93 fL (ref 79–97)
Monocytes Absolute: 1.4 10*3/uL — ABNORMAL HIGH (ref 0.1–0.9)
Monocytes: 11 %
Neutrophils Absolute: 8.6 10*3/uL — ABNORMAL HIGH (ref 1.4–7.0)
Neutrophils: 69 %
Platelets: 165 10*3/uL (ref 150–450)
RBC: 4.52 x10E6/uL (ref 3.77–5.28)
RDW: 11 % — ABNORMAL LOW (ref 11.7–15.4)
WBC: 12.6 10*3/uL — ABNORMAL HIGH (ref 3.4–10.8)

## 2020-08-20 LAB — TSH: TSH: 0.343 u[IU]/mL — ABNORMAL LOW (ref 0.450–4.500)

## 2020-08-20 LAB — T3: T3, Total: 68 ng/dL — ABNORMAL LOW (ref 71–180)

## 2020-08-20 LAB — MICROALBUMIN, URINE: Microalbumin, Urine: 48.7 ug/mL

## 2020-08-23 ENCOUNTER — Other Ambulatory Visit: Payer: Self-pay | Admitting: Nurse Practitioner

## 2020-08-23 DIAGNOSIS — Z1329 Encounter for screening for other suspected endocrine disorder: Secondary | ICD-10-CM

## 2020-08-23 NOTE — Progress Notes (Signed)
   [facility] Patient [facility] [address] [place], [place]  [address] Phone:  [phone]   Fax:  [phone]  TSH 0.343 uIU/mL T3 total 68 ng/dL and free T4 9.37 Evaluate ablation due to some persistent tremors "shakes".  Currently under evaluation by neurology.  Patient requested to have labs completed. Assessment  Primary Diagnosis & Pertinent Problem List: The encounter diagnosis was Screening for thyroid disorder.  Visit Diagnosis: 1. Screening for thyroid disorder   Thyroid ultrasound pending for advancing to any medication due to possible side effects

## 2020-08-24 LAB — URINE CULTURE

## 2020-09-07 ENCOUNTER — Telehealth: Payer: Self-pay | Admitting: Nurse Practitioner

## 2020-09-07 NOTE — Telephone Encounter (Signed)
Morgan Galloway, want to know if her referral was sent in for a Neurology. She would like a call back to give her a updated. Thanks

## 2020-09-07 NOTE — Telephone Encounter (Signed)
Patient was actually calling for Urology referral. An appointment has been scheduled for 10/20/2020 at 2:45 pm at [facility]. Information left on voicemail  per patient request.

## 2020-09-07 NOTE — Telephone Encounter (Signed)
Left voice mail to call back 

## 2020-10-04 ENCOUNTER — Ambulatory Visit (HOSPITAL_COMMUNITY): Payer: Medicaid Other

## 2020-11-29 ENCOUNTER — Telehealth: Payer: Medicaid Other | Admitting: Family

## 2020-11-29 DIAGNOSIS — A6000 Herpesviral infection of urogenital system, unspecified: Secondary | ICD-10-CM | POA: Diagnosis not present

## 2020-11-29 MED ORDER — VALACYCLOVIR HCL 500 MG PO TABS: 500.0000 mg | ORAL_TABLET | Freq: Two times a day (BID) | ORAL | 0 refills | Status: AC

## 2020-11-29 NOTE — Progress Notes (Signed)
E-Visit for Herpes Simplex  We are sorry that you are not feeling well.  Here is how we plan to help!  Based on what you have shared ith me, it looks like you may be having an outbreak/flare-up of genital herpes.    I have prescribed I have prescribed Valacyclovir 500 mg Take one by mouth twice a day for 3 days.    If you have been prescribed long term medications to be taken on a regular basis, it is important to follow the recommendations and take them as ordered.    Outbreaks usually include blisters and open sores in the genital area. Outbreaks that happen after the first time are usually not as severe and do not last as long. Genital Herpes Simplex is a commonly sexually transmitted viral infection that is found worldwide. Most of these genital infections are caused by one or two herpes simplex viruses that is passed from person to person during vaginal, oral, or anal sex. Sometimes, people do not know they have herpes because they do not have any symptoms.  Please be aware that if you have genital herpes you can be contagious even when you are not having rash or flare-up and you may not have any symptoms, even when you are taking suppressive medicines.  Herpes cannot be cured. The disease usually causes most problems during the first few years. After that, the virus is still there, but it causes few to no symptoms. Even when the virus is active, people with herpes can take medicines to reduce and help prevent symptoms.  Herpes is an infection that can cause blisters and open sores on the genital area. Herpes is caused by a virus that is passed from person to person during vaginal, oral, or anal sex. Sometimes, people do not know they have herpes because they do not have any symptoms. Herpes cannot be cured. The disease usually causes most problems during the first few years. After that, the virus is still there, but it causes few to no symptoms. Even when the virus is active, people with herpes  can take medicines to reduce and help prevent symptoms.  If you have been prescribed medications to be taken on a regular basis, it is important to follow the recommendations and take them as ordered.  Some people with herpes never have any symptoms. But other people can develop symptoms within a few weeks of being infected with the herpes virus   Symptoms usually include blisters in the genital area. In women, this area includes the vagina, buttocks, anus, or thighs. In men, this area includes the penis, scrotum, anus, butt, or thighs. The blisters can become painful open sores, which then crust over as they heal. Sometimes, people can have other symptoms that include:  ?Blisters on the mouth or lips ?Fever, headache, or pain in the joints ?Trouble urinating  Outbreaks might occur every month or more often, or just once or twice a year. Sometimes, people can tell when an outbreak will occur, because they feel itching or pain beforehand. Sometimes they do not know that an outbreak is coming because they have no symptoms. Whatever your pattern is, keep in mind that herpes outbreaks usually become less frequent over time as you get older. Certain things, called "triggers," can make outbreaks more likely to occur. These include stress, sunlight, menstrual periods,or getting sick.  Antiviral therapy can shorten the duration of symptoms and signs in primary infection, which, when untreated, can be associated with significant increase in the   symptoms of the disease.  HOME CARE Use a portable bath (such as a "Sitz bath") where you can sit in warm water for about 20 minutes. Your bathtub could also work. Avoid bubble baths.  Keep the genital area clean and dry and avoid tight clothes.  Take over-the-counter pain medicine such as acetaminophen (brand name: Tylenol) or ibuprofen sample brand names: Advil, Motrin). But avoid aspirin.  Only take medications as instructed by your medical team.  You are  most likely to spread herpes to a sex partner when you have blisters and open sores on your body. But it's also possible to spread herpes to your partner when you do not have any symptoms. That is because herpes can be present on your body without causing any symptoms, like blisters or pain.  Telling your sex partner that you have herpes can be hard. But it can help protect them, since there are ways to lower the risk of spreading the infection.   Using a condom every time you have sex  Not having sex when you have symptoms  Not having oral sex if you have blisters or open sores (in the genital area or around your mouth)  MAKE SURE YOU   Understand these instructions. Do not have sex without using a condom until you have been seen by a doctor and as instructed by the provider If you are not better or improved within 7 days, you MUST have a follow up at your doctor or the health department for evaluation. There are other causes of rashes in the genital region.  Thank you for choosing an e-visit.  Your e-visit answers were reviewed by a board certified advanced clinical practitioner to complete your personal care plan. Depending upon the condition, your plan could have included both over the counter or prescription medications.  Please review your pharmacy choice. Make sure the pharmacy is open so you can pick up prescription now. If there is a problem, you may contact your provider through MyChart messaging and have the prescription routed to another pharmacy.  Your safety is important to us. If you have drug allergies check your prescription carefully.   For the next 24 hours you can use MyChart to ask questions about today's visit, request a non-urgent call back, or ask for a work or school excuse. You will get an email in the next two days asking about your experience. I hope that your e-visit has been valuable and will speed your recovery.  Approximately 5 minutes was spent documenting  and reviewing patient's chart.     

## 2020-11-30 ENCOUNTER — Telehealth: Payer: Self-pay | Admitting: Neurology

## 2020-11-30 NOTE — Telephone Encounter (Signed)
I'm not sure that I understand the details.  Does she need a new RX?

## 2020-11-30 NOTE — Telephone Encounter (Signed)
Called patient and left a message for a call back to get some clarification.

## 2020-11-30 NOTE — Telephone Encounter (Signed)
Pt stated she has not been taking the propanolol. Stated she cant take it,  it had gotten wet and didn't feel comfortable. She still has the bottle and can show you. She doesn't know what to do, and her appt is not till sept Please call to discuss [phone]

## 2020-12-01 NOTE — Telephone Encounter (Signed)
Called patient and left a message for a call back.  

## 2020-12-02 NOTE — Telephone Encounter (Signed)
Called patient and left a message for a call back.  

## 2020-12-30 NOTE — Progress Notes (Deleted)
Assessment/Plan:    1.  Tremor, multifactorial  ***Patient has baseline essential tremor, that became bothersome only when Depakote was added  Subjective:   Morgan Galloway was seen today in follow up for tremor.  My previous records were reviewed prior to todays visit.  Last visit, patient was complaining of tremor.  Felt that she likely had baseline essential tremor, made much worse when she was started on Depakote.  Last visit, we decided to start her on as needed propranolol, but told her she needed to talk to her physician assistant about the Depakote, because she was pregnancy planning.  She called me in early August to state that she was not taking the propranolol.  She stated that the medication had gotten wet.  I was not sure if she just needed new prescription.  We tried to call her back multiple times, but she did not answer our phone calls.  Today, she states that ***.  Current prescribed movement disorder medications: ***Propranolol, 10 mg twice per day as needed   PREVIOUS MEDICATIONS: {Parkinson's RX:18200}  ALLERGIES:  No Known Allergies  CURRENT MEDICATIONS:  Outpatient Encounter Medications as of 12/31/2020  Medication Sig   clonazePAM (KLONOPIN) 1 MG tablet Take 1 mg by mouth daily.   divalproex (DEPAKOTE ER) 250 MG 24 hr tablet Take 250 mg by mouth 2 (two) times daily.   divalproex (DEPAKOTE ER) 500 MG 24 hr tablet Take 500 mg by mouth 2 (two) times a day.   norethindrone-ethinyl estradiol (LOESTRIN FE) 1-20 MG-MCG tablet Take 1 tablet by mouth daily. (Patient not taking: Reported on 08/19/2020)   propranolol (INDERAL) 10 MG tablet Take 1 tablet (10 mg total) by mouth 2 (two) times daily as needed.   QUEtiapine (SEROQUEL) 200 MG tablet Take 200 mg by mouth at bedtime.   No facility-administered encounter medications on file as of 12/31/2020.     Objective:    PHYSICAL EXAMINATION:    VITALS:  There were no vitals filed for this visit.  GEN:  The patient  appears stated age and is in NAD. HEENT:  Normocephalic, atraumatic.  The mucous membranes are moist. The superficial temporal arteries are without ropiness or tenderness. CV:  RRR Lungs:  CTAB Neck/HEME:  There are no carotid bruits bilaterally.  Neurological examination:  Orientation: The patient is alert and oriented x3. Cranial nerves: There is good facial symmetry. The speech is fluent and clear. Soft palate rises symmetrically and there is no tongue deviation. Hearing is intact to conversational tone. Sensation: Sensation is intact to light touch throughout Motor: Strength is at least antigravity x4.  Movement examination: Tone: There is normal tone in the UE/LE Abnormal movements: *** Coordination:  There is *** decremation with RAM's, *** Gait and Station: The patient has *** difficulty arising out of a deep-seated chair without the use of the hands. The patient's stride length is good I have reviewed and interpreted the following labs independently   Chemistry      Component Value Date/Time   NA 136 08/19/2020 1624   K 4.0 08/19/2020 1624   CL 96 08/19/2020 1624   CO2 23 06/09/2020 1403   BUN 12 08/19/2020 1624   CREATININE 0.93 08/19/2020 1624   CREATININE 0.85 12/23/2013 1843      Component Value Date/Time   CALCIUM 9.4 08/19/2020 1624   ALKPHOS 78 08/19/2020 1624   AST 16 08/19/2020 1624   ALT 16 06/09/2020 1403   BILITOT 0.6 08/19/2020 1624  Lab Results  Component Value Date   WBC 12.6 (H) 08/19/2020   HGB 13.7 08/19/2020   HCT 41.8 08/19/2020   MCV 93 08/19/2020   PLT 165 08/19/2020   Lab Results  Component Value Date   TSH 0.343 (L) 08/19/2020     Chemistry      Component Value Date/Time   NA 136 08/19/2020 1624   K 4.0 08/19/2020 1624   CL 96 08/19/2020 1624   CO2 23 06/09/2020 1403   BUN 12 08/19/2020 1624   CREATININE 0.93 08/19/2020 1624   CREATININE 0.85 12/23/2013 1843      Component Value Date/Time   CALCIUM 9.4 08/19/2020 1624    ALKPHOS 78 08/19/2020 1624   AST 16 08/19/2020 1624   ALT 16 06/09/2020 1403   BILITOT 0.6 08/19/2020 1624         Total time spent on today's visit was ***30 minutes, including both face-to-face time and nonface-to-face time.  Time included that spent on review of records (prior notes available to me/labs/imaging if pertinent), discussing treatment and goals, answering patient's questions and coordinating care.  Cc:  Kallie Locks, FNP (Inactive)

## 2020-12-31 ENCOUNTER — Ambulatory Visit: Payer: Medicaid Other | Admitting: Neurology

## 2021-02-15 ENCOUNTER — Ambulatory Visit: Payer: Medicaid Other | Admitting: Nurse Practitioner

## 2021-02-15 ENCOUNTER — Other Ambulatory Visit: Payer: Self-pay

## 2021-02-15 ENCOUNTER — Encounter: Payer: Self-pay | Admitting: Nurse Practitioner

## 2021-02-15 VITALS — BP 120/60 | HR 90 | Temp 98.0°F | Ht 63.8 in | Wt 160.6 lb

## 2021-02-15 DIAGNOSIS — F32A Depression, unspecified: Secondary | ICD-10-CM | POA: Diagnosis not present

## 2021-02-15 DIAGNOSIS — F419 Anxiety disorder, unspecified: Secondary | ICD-10-CM | POA: Diagnosis not present

## 2021-02-15 DIAGNOSIS — L708 Other acne: Secondary | ICD-10-CM

## 2021-02-15 MED ORDER — CLONAZEPAM 1 MG PO TABS: 1.0000 mg | ORAL_TABLET | Freq: Every day | ORAL | 0 refills | Status: DC

## 2021-02-15 NOTE — Patient Instructions (Signed)
Bipolar 1 Disorder Bipolar 1 disorder is a mental health disorder in which a person has episodes of emotional highs, or mania, and may also have episodes of lows, or depression. Bipolar 1 disorder is different from other bipolar disorders in that it involves extreme episodes of mania (manic episodes). These episodes last at least one week or involve symptoms that are so severe that hospitalization is needed to keep the person safe. What are the causes? The cause of this condition is not known. What increases the risk? The following factors may make you more likely to develop this condition: Having a family member with the disorder. Having an imbalance of certain chemicals in the brain (neurotransmitters). Experiencing stress, such as illness, financial problems, or a death. Having certain conditions that affect the brain or spinal cord (neurologic conditions). Having had a brain injury (trauma). What are the signs or symptoms? Symptoms of mania include: Very high self-esteem or self-confidence. Decreased need for sleep. Unusual talkativeness. Speech may be very fast. Racing thoughts with quick shifts between topics that may or may not be related (flight of ideas). Ability to concentrate either greatly improved or decreased. Increased purposeful activity, such as work, studies, or social activity. Increased agitation. This could be pacing, squirming, fidgeting, or finger and toe tapping. Impulsive behavior and poor judgment. This may result in high-risk activities that are sexual, financial, or physical. Symptoms of depression include: Extreme degrees of sadness, uncontrollable crying, hopelessness, worthlessness, or numbness. Sleep problems, such as insomnia, waking early, or sleeping too much. No longer enjoying things you used to enjoy. Isolation. You may often spend time alone. Lack of energy or motivation, and moving more slowly than normal. Trouble making decisions. Increased  appetite or loss of appetite. Thoughts of death, or wanting to harm yourself. Sometimes, you may have a mixed mood. This means having symptoms of mania and depression at the same time. Stress can often trigger these symptoms. How is this diagnosed? This condition may be diagnosed based on: Emotional episodes. Medical history. Use of alcohol, drugs, and prescription medicines. Certain medical conditions and substances can cause symptoms that seem like bipolar disorder. This is called secondary bipolar disorder. Your health care provider may ask you to take a short test. This helps to understand your symptoms. You may also be asked to see a mental health specialist to follow up on this diagnosis and start treatment. How is this treated?   This condition is a long-term (chronic) illness. It is often managed with ongoing treatment rather than treatment only when symptoms occur. A combination of treatments is the main approach. Treatment may include: Medicine. Medicine can be prescribed by a health care provider who specializes in treating mental health disorders (psychiatrist). Medicines called mood stabilizers are usually prescribed. If symptoms occur during treatment with a mood stabilizer, other medicines may be added. Psychotherapy. Some forms of talk therapy, such as cognitive behavioral therapy (CBT) and family therapy, can help with learning to manage bipolar disorder. Psychoeducation. This helps you and others understand how this disorder is managed. Include friends and family in educational sessions so they learn how best to support you. Methods of managing your condition, such as journaling or relaxation exercises. Relaxation exercises include: Yoga. Meditation. Deep breathing. Lifestyle changes, such as: Limiting alcohol and drug use. Exercising regularly. Structuring when you go to bed and get up. Eating a healthy diet. Electroconvulsive therapy (ECT). This is a procedure in which  electricity is applied to the brain through the scalp. It may   be used in cases of severe bipolar disorder when medicine and psychotherapy work too slowly or do not work. Follow these instructions at home: Activity Return to your normal activities as told by your health care provider. Find activities that you enjoy, and make time to do them. Exercise regularly as told by your health care provider. Lifestyle  Follow a set schedule for eating and sleeping. Eat a healthy diet that includes fresh fruits and vegetables, whole grains, low-fat dairy, and lean meat. Get at least 7-8 hours of sleep each night. Avoid using products that contain nicotine or tobacco. If you want help quitting, ask your health care provider. Do not use drugs. Alcohol use Do not drink alcohol if: Your health care provider tells you not to drink. You are pregnant, may be pregnant, or are planning to become pregnant. If you drink alcohol: Limit how much you use to: 0-1 drink a day for women. 0-2 drinks a day for men. Be aware of how much alcohol is in your drink. In the [place] one drink equals one 12 oz bottle of beer (355 mL), one 5 oz glass of wine (148 mL), or one 1 oz glass of hard liquor (44 mL). General instructions Take over-the-counter and prescription medicines only as told by your health care provider. You may think about stopping your medicine, but it is very important to take all your medicine as prescribed. Consider joining a support group. Your health care provider may be able to recommend one. Talk with your family and friends about your treatment goals and how they can help. Keep all follow-up visits as told by your health care provider. This is important. Where to find more information National Alliance on Mental Illness: www.nami.org National Institute of Mental Health: www.nimh.nih.gov Contact a health care provider if: Your symptoms get worse, or your loved ones tell you that your symptoms are  getting worse. You have uncomfortable side effects from your medicine. You have trouble sleeping. You have trouble doing daily activities. You feel unsafe in your surroundings. You are self-medicating with alcohol or drugs. Get help right away if: You have new symptoms. You have thoughts about harming yourself or others. You are considering suicide. If you ever feel like you may hurt yourself or others, or have thoughts about taking your own life, get help right away. You can go to your nearest emergency department or call: Your local emergency services (911 in the [place] A suicide crisis helpline, such as the National Suicide Prevention Lifeline at 1-800-273-8255. This is open 24 hours a day. Summary Bipolar 1 disorder is a lifelong mental health disorder in which a person has episodes of mania and depression. This disorder is mainly treated with a combination of medicines, talk and behavioral therapies, and, often, electroconvulsive therapy (ECT). Include friends and family in educational sessions so they know how best to support you. Get help right away if you are considering suicide. This information is not intended to replace advice given to you by your health care provider. Make sure you discuss any questions you have with your health care provider. Document Revised: 09/12/2018 Document Reviewed: 09/24/2018 Elsevier Patient Education  2022 Elsevier Inc.  

## 2021-02-15 NOTE — Progress Notes (Signed)
I,Tianna Badgett,acting as a Neurosurgeon for [employer], NP.,have documented all relevant documentation on the behalf of [employer], NP,as directed by  Charlesetta Ivory, NP while in the presence of Charlesetta Ivory, NP.  This visit occurred during the SARS-CoV-2 public health emergency.  Safety protocols were in place, including screening questions prior to the visit, additional usage of staff PPE, and extensive cleaning of exam room while observing appropriate contact time as indicated for disinfecting solutions.  Subjective:     Patient ID: Morgan Galloway , female    DOB: 07/05/1990 , 30 y.o.   MRN: [id number]   Chief Complaint  Patient presents with   Establish Care    HPI  Patient is here to establish care. Her last PCP was Dr Bradly Chris. She would like a referral to a new psychiatrist. She would also like a referral to dermatology. She was seeing Raliegh Ip.   She was seeing a psychiatrist for bipolar, anxiety and depression. She was taking clozapine for a long time but has been out of it for a while and since have been feeling more anxiety and shakiness. She has been taking it for many years.  Denies shortness of breath, chest pain, suicidal thoughts.    Past Medical History:  Diagnosis Date   Anxiety    Bipolar 1 disorder, manic, mild (HCC)    Chronic kidney disease    Depression    Zoloft in past; stopped due to potential of pregnancy.   Gonorrhea    Headache    History of stillbirth    June 2012   Infection    UTI   Vaginal Pap smear, abnormal    f/u ok    Family History  Problem Relation Age of Onset   Bipolar disorder Mother    Stroke Father 64   Bipolar disorder Sister    Healthy Daughter    Healthy Son    Cancer Maternal Grandmother    Other Neg Hx     Current Outpatient Medications:    clonazePAM (KLONOPIN) 1 MG tablet, Take 1 tablet (1 mg total) by mouth daily., Disp: 30 tablet, Rfl: 0   divalproex (DEPAKOTE ER) 500 MG 24 hr tablet, Take  500 mg by mouth 2 (two) times a day., Disp: , Rfl:    No Known Allergies   Review of Systems  Constitutional: Negative.  Negative for chills and fever.  HENT:  Negative for congestion, facial swelling, rhinorrhea, sinus pressure, sinus pain and voice change.   Respiratory: Negative.  Negative for shortness of breath and wheezing.   Cardiovascular: Negative.  Negative for chest pain and palpitations.  Gastrointestinal: Negative.  Negative for abdominal distention, constipation, diarrhea and nausea.  Endocrine: Negative for polydipsia, polyphagia and polyuria.  Musculoskeletal:  Negative for arthralgias and myalgias.  Skin:  Positive for rash.       Facial acne  Neurological: Negative.   Psychiatric/Behavioral:  Positive for decreased concentration and sleep disturbance. The patient is nervous/anxious.     Today's Vitals   02/15/21 1452  BP: 120/60  Pulse: 90  Temp: 98 F (36.7 C)  TempSrc: Oral  Weight: 160 lb 9.6 oz (72.8 kg)  Height: 5' 3.8" (1.621 m)   Body mass index is 27.74 kg/m.   Objective:  Physical Exam Constitutional:      Appearance: Normal appearance.  HENT:     Head: Normocephalic and atraumatic.  Cardiovascular:     Rate and Rhythm: Normal rate and regular rhythm.     Pulses:  Normal pulses.     Heart sounds: Normal heart sounds. No murmur heard. Pulmonary:     Effort: Pulmonary effort is normal. No respiratory distress.     Breath sounds: Normal breath sounds. No wheezing.  Skin:    General: Skin is warm and dry.     Capillary Refill: Capillary refill takes less than 2 seconds.  Neurological:     Mental Status: She is oriented to person, place, and time.  Psychiatric:        Mood and Affect: Mood normal.        Behavior: Behavior normal.        Assessment And Plan:     1. Depression, unspecified depression type -Patient currently has a therapist.  -She is taking meds (Seroquel, Depakote) previously from her old psychiatrist.  -Referral put in  for psychiatrist.  -Advised pt.to try relaxation methods. Get support from her family. -Continue her meds. If she ever has a mental crisis, number to the hotline is provided for her and pt. Advised to go to the hospital.   2. Anxiety - Ambulatory referral to Psychiatry -Continue meds.  -She has been out of her clonazepam for a couple of weeks now and states she has been feeling more depressed, anxious and her shakiness has gotten worse. -Will refill this time so her symptoms do not get worse; with no refill until she sees specialist and to avoid her symptoms getting worse. Patient given side-effects of meds. Agrees to the plan.  - clonazePAM (KLONOPIN) 1 MG tablet; Take 1 tablet (1 mg total) by mouth daily.  Dispense: 30 tablet; Refill: 0  3. Other acne -patient has used products in the past with no success.  - Ambulatory referral to Dermatology   The patient was encouraged to call or send a message through MyChart for any questions or concerns.   Follow up: if symptoms persist or do not get better.   Side effects and appropriate use of all the medication(s) were discussed with the patient today. Patient advised to use the medication(s) as directed by their healthcare provider. The patient was encouraged to read, review, and understand all associated package inserts and contact our office with any questions or concerns. The patient accepts the risks of the treatment plan and had an opportunity to ask questions.   Staying healthy and adopting a healthy lifestyle for your overall health is important. You should eat 7 or more servings of fruits and vegetables per day. You should drink plenty of water to keep yourself hydrated and your kidneys healthy. This includes about 65-80+ fluid ounces of water. Limit your intake of animal fats especially for elevated cholesterol. Avoid highly processed food and limit your salt intake if you have hypertension. Avoid foods high in saturated/Trans fats. Along  with a healthy diet it is also very important to maintain time for yourself to maintain a healthy mental health with low stress levels. You should get atleast 150 min of moderate intensity exercise weekly for a healthy heart. Along with eating right and exercising, aim for at least 7-9 hours of sleep daily.  Eat more whole grains which includes barley, wheat berries, oats, brown rice and whole wheat pasta. Use healthy plant oils which include olive, soy, corn, sunflower and peanut. Limit your caffeine and sugary drinks. Limit your intake of fast foods. Limit milk and dairy products to one or two daily servings.   Patient was given opportunity to ask questions. Patient verbalized understanding of the plan and was  able to repeat key elements of the plan. All questions were answered to their satisfaction.  Raman Alexis Reber, DNP   I, Raman Rosmery Duggin have reviewed all documentation for this visit. The documentation on 02/15/21 for the exam, diagnosis, procedures, and orders are all accurate and complete.   IF YOU HAVE BEEN REFERRED TO A SPECIALIST, IT MAY TAKE 1-2 WEEKS TO SCHEDULE/PROCESS THE REFERRAL. IF YOU HAVE NOT HEARD FROM US/SPECIALIST IN TWO WEEKS, PLEASE GIVE [place] A CALL AT [phone].   THE PATIENT IS ENCOURAGED TO PRACTICE SOCIAL DISTANCING DUE TO THE COVID-19 PANDEMIC.

## 2021-03-09 ENCOUNTER — Telehealth: Payer: Medicaid Other | Admitting: Physician Assistant

## 2021-03-09 DIAGNOSIS — A6004 Herpesviral vulvovaginitis: Secondary | ICD-10-CM

## 2021-03-09 MED ORDER — VALACYCLOVIR HCL 500 MG PO TABS: 500.0000 mg | ORAL_TABLET | Freq: Two times a day (BID) | ORAL | 0 refills | Status: AC

## 2021-03-09 NOTE — Progress Notes (Signed)
E-Visit for Herpes Simplex  We are sorry that you are not feeling well.  Here is how we plan to help!  Based on what you have shared ith me, it looks like you may be having an outbreak/flare-up of genital herpes.    I have prescribed I have prescribed Valacyclovir 500 mg Take one by mouth twice a day for 3 days.    If you have been prescribed long term medications to be taken on a regular basis, it is important to follow the recommendations and take them as ordered.    Outbreaks usually include blisters and open sores in the genital area. Outbreaks that happen after the first time are usually not as severe and do not last as long. Genital Herpes Simplex is a commonly sexually transmitted viral infection that is found worldwide. Most of these genital infections are caused by one or two herpes simplex viruses that is passed from person to person during vaginal, oral, or anal sex. Sometimes, people do not know they have herpes because they do not have any symptoms.  Please be aware that if you have genital herpes you can be contagious even when you are not having rash or flare-up and you may not have any symptoms, even when you are taking suppressive medicines.  Herpes cannot be cured. The disease usually causes most problems during the first few years. After that, the virus is still there, but it causes few to no symptoms. Even when the virus is active, people with herpes can take medicines to reduce and help prevent symptoms.  Herpes is an infection that can cause blisters and open sores on the genital area. Herpes is caused by a virus that is passed from person to person during vaginal, oral, or anal sex. Sometimes, people do not know they have herpes because they do not have any symptoms. Herpes cannot be cured. The disease usually causes most problems during the first few years. After that, the virus is still there, but it causes few to no symptoms. Even when the virus is active, people with herpes  can take medicines to reduce and help prevent symptoms.  If you have been prescribed medications to be taken on a regular basis, it is important to follow the recommendations and take them as ordered.  Some people with herpes never have any symptoms. But other people can develop symptoms within a few weeks of being infected with the herpes virus   Symptoms usually include blisters in the genital area. In women, this area includes the vagina, buttocks, anus, or thighs. In men, this area includes the penis, scrotum, anus, butt, or thighs. The blisters can become painful open sores, which then crust over as they heal. Sometimes, people can have other symptoms that include:  ?Blisters on the mouth or lips ?Fever, headache, or pain in the joints ?Trouble urinating  Outbreaks might occur every month or more often, or just once or twice a year. Sometimes, people can tell when an outbreak will occur, because they feel itching or pain beforehand. Sometimes they do not know that an outbreak is coming because they have no symptoms. Whatever your pattern is, keep in mind that herpes outbreaks usually become less frequent over time as you get older. Certain things, called "triggers," can make outbreaks more likely to occur. These include stress, sunlight, menstrual periods,or getting sick.  Antiviral therapy can shorten the duration of symptoms and signs in primary infection, which, when untreated, can be associated with significant increase in the   symptoms of the disease.  HOME CARE Use a portable bath (such as a "Sitz bath") where you can sit in warm water for about 20 minutes. Your bathtub could also work. Avoid bubble baths.  Keep the genital area clean and dry and avoid tight clothes.  Take over-the-counter pain medicine such as acetaminophen (brand name: Tylenol) or ibuprofen sample brand names: Advil, Motrin). But avoid aspirin.  Only take medications as instructed by your medical team.  You are  most likely to spread herpes to a sex partner when you have blisters and open sores on your body. But it's also possible to spread herpes to your partner when you do not have any symptoms. That is because herpes can be present on your body without causing any symptoms, like blisters or pain.  Telling your sex partner that you have herpes can be hard. But it can help protect them, since there are ways to lower the risk of spreading the infection.   Using a condom every time you have sex  Not having sex when you have symptoms  Not having oral sex if you have blisters or open sores (in the genital area or around your mouth)  MAKE SURE YOU   Understand these instructions. Do not have sex without using a condom until you have been seen by a doctor and as instructed by the provider If you are not better or improved within 7 days, you MUST have a follow up at your doctor or the health department for evaluation. There are other causes of rashes in the genital region.  Thank you for choosing an e-visit.  Your e-visit answers were reviewed by a board certified advanced clinical practitioner to complete your personal care plan. Depending upon the condition, your plan could have included both over the counter or prescription medications.  Please review your pharmacy choice. Make sure the pharmacy is open so you can pick up prescription now. If there is a problem, you may contact your provider through MyChart messaging and have the prescription routed to another pharmacy.  Your safety is important to us. If you have drug allergies check your prescription carefully.   For the next 24 hours you can use MyChart to ask questions about today's visit, request a non-urgent call back, or ask for a work or school excuse. You will get an email in the next two days asking about your experience. I hope that your e-visit has been valuable and will speed your recovery.      

## 2021-03-09 NOTE — Progress Notes (Signed)
I have spent 5 minutes in review of e-visit questionnaire, review and updating patient chart, medical decision making and response to patient.   Nochum Fenter Cody Dayanis Bergquist, PA-C    

## 2021-04-24 NOTE — L&D Delivery Note (Signed)
[facility] Delivery Note  Morgan Galloway is a 31 y.o. [id number] s/p vag del at [redacted]w[redacted]d. She was admitted for labor.   ROM: 1h 52m with clear fluid GBS Status: neg Maximum Maternal Temperature: 99.1   Delivery Date/Time: July 31st, 2023 at 1:31pm Delivery: Called to room to stand-by for Dr Normand Sloop, who was in the OR. The patient was crowning. On the next contraction, without mom pushing, the head delivered LOA. Nuchal cord present x 1; tight and not reduced; delivered through using the somersault. Shoulder and body delivered in usual fashion. Infant with spontaneous cry, placed on mother's abdomen, dried and stimulated. Cord clamped x 2 after 1-minute delay, and cut by FOB. Cord blood drawn. Placenta delivered spontaneously with gentle cord traction. Fundus firm with massage and Pitocin. Labia, perineum, vagina, and cervix inspected and found to be intact.   Placenta: spont, intact; to L&D Complications: none Lacerations: none EBL: 51cc Analgesia: epidural  Infant: girl  APGARs 9/9  weight: pending  JEANANNE BEDWELL, CNM  11/21/2021 1:42 PM

## 2021-05-04 LAB — OB RESULTS CONSOLE HIV ANTIBODY (ROUTINE TESTING): HIV: NONREACTIVE

## 2021-05-04 LAB — OB RESULTS CONSOLE RUBELLA ANTIBODY, IGM: Rubella: IMMUNE

## 2021-05-04 LAB — OB RESULTS CONSOLE HEPATITIS B SURFACE ANTIGEN: Hepatitis B Surface Ag: NEGATIVE

## 2021-05-10 ENCOUNTER — Encounter (HOSPITAL_COMMUNITY): Payer: Self-pay | Admitting: Obstetrics & Gynecology

## 2021-05-10 ENCOUNTER — Other Ambulatory Visit: Payer: Self-pay

## 2021-05-10 ENCOUNTER — Inpatient Hospital Stay (HOSPITAL_COMMUNITY)
Admission: AD | Admit: 2021-05-10 | Discharge: 2021-05-10 | Disposition: A | Discharge status: Home / Self Care | Payer: Medicaid Other | Attending: Obstetrics & Gynecology | Admitting: Obstetrics & Gynecology

## 2021-05-10 DIAGNOSIS — R112 Nausea with vomiting, unspecified: Secondary | ICD-10-CM | POA: Diagnosis present

## 2021-05-10 DIAGNOSIS — O219 Vomiting of pregnancy, unspecified: Secondary | ICD-10-CM

## 2021-05-10 DIAGNOSIS — Z3A09 9 weeks gestation of pregnancy: Secondary | ICD-10-CM

## 2021-05-10 DIAGNOSIS — O218 Other vomiting complicating pregnancy: Secondary | ICD-10-CM | POA: Insufficient documentation

## 2021-05-10 LAB — URINALYSIS, ROUTINE W REFLEX MICROSCOPIC
Bilirubin Urine: NEGATIVE
Glucose, UA: NEGATIVE mg/dL
Hgb urine dipstick: NEGATIVE
Ketones, ur: >80 mg/dL — AB
Leukocytes,Ua: NEGATIVE
Nitrite: NEGATIVE
Protein, ur: NEGATIVE mg/dL
Specific Gravity, Urine: >1.03 — ABNORMAL HIGH (ref 1.005–1.030)
pH: 6 (ref 5.0–8.0)

## 2021-05-10 MED ORDER — LACTATED RINGERS IV BOLUS
1000.0000 mL | Freq: Once | INTRAVENOUS | Status: AC
  Administered 2021-05-10: 1000 mL via INTRAVENOUS

## 2021-05-10 MED ORDER — SCOPOLAMINE 1 MG/3DAYS TD PT72: 1.0000 | MEDICATED_PATCH | TRANSDERMAL | 1 refills | Status: DC

## 2021-05-10 MED ORDER — SCOPOLAMINE 1 MG/3DAYS TD PT72
1.0000 | MEDICATED_PATCH | Freq: Once | TRANSDERMAL | Status: DC
  Administered 2021-05-10: 1.5 mg via TRANSDERMAL
  Filled 2021-05-10: qty 1

## 2021-05-10 MED ORDER — SODIUM CHLORIDE 0.9 % IV SOLN
25.0000 mg | Freq: Once | INTRAVENOUS | Status: AC
  Administered 2021-05-10: 25 mg via INTRAVENOUS
  Filled 2021-05-10: qty 1

## 2021-05-10 MED ORDER — FAMOTIDINE IN NACL 20-0.9 MG/50ML-% IV SOLN
20.0000 mg | Freq: Once | INTRAVENOUS | Status: AC
  Administered 2021-05-10: 20 mg via INTRAVENOUS
  Filled 2021-05-10: qty 50

## 2021-05-10 MED ORDER — PROMETHAZINE HCL 25 MG PO TABS
25.0000 mg | ORAL_TABLET | Freq: Four times a day (QID) | ORAL | 1 refills | Status: DC | PRN
Start: 2021-05-10 — End: 2023-11-12

## 2021-05-10 NOTE — Progress Notes (Signed)
Discussed discharge instructions with pt and significant other; pt verbalized understanding.

## 2021-05-10 NOTE — MAU Provider Note (Signed)
History     CSN: [id number]  Arrival date and time: 05/10/21 1633   Event Date/Time   First Provider Initiated Contact with Patient 05/10/21 1741      Chief Complaint  Patient presents with   Nausea   Emesis   HPI Morgan Galloway is a 31 y.o. [id number] at [redacted]w[redacted]d who presents with n/v. This has been an ongoing issue with the pregnancy. States hasn't been able to eat in the last 2 days. Reports vomiting every 2 hours. Has been taking phenergan every 6 hours without relief. Was taking zofran earlier in the pregnancy but states that didn't work so stopped using it. Previously only fluid she could keep down was Dr. Malachi Bonds but reports not she vomits after drinking it.  Denies abdominal pain, fever, vaginal bleeding, or diarrhea.  OB History     Gravida  7   Para  2   Term  2   Preterm  0   AB  4   Living  2      SAB  3   IAB      Ectopic      Multiple  0   Live Births  2           Past Medical History:  Diagnosis Date   Anxiety    Bipolar 1 disorder, manic, mild (HCC)    Chronic kidney disease    Depression    Zoloft in past; stopped due to potential of pregnancy.   Gonorrhea    Headache    Vaginal Pap smear, abnormal    f/u ok    Past Surgical History:  Procedure Laterality Date   WISDOM TOOTH EXTRACTION      Family History  Problem Relation Age of Onset   Bipolar disorder Mother    Stroke Father 79   Bipolar disorder Sister    Healthy Daughter    Healthy Son    Cancer Maternal Grandmother    Other Neg Hx     Social History   Tobacco Use   Smoking status: Every Day    Packs/day: 0.50    Years: 15.00    Pack years: 7.50    Types: Cigarettes   Smokeless tobacco: Never  Vaping Use   Vaping Use: Never used  Substance Use Topics   Alcohol use: Yes    Comment: 3-5 beers   Drug use: Yes    Types: Marijuana    Comment: last use 11/03/18    Allergies: No Known Allergies  Medications Prior to Admission  Medication Sig Dispense Refill  Last Dose   promethazine (PHENERGAN) 25 MG tablet Take 25 mg by mouth every 6 (six) hours as needed for nausea or vomiting.      cephALEXin (KEFLEX) 250 MG capsule Take by mouth.      clonazePAM (KLONOPIN) 1 MG tablet Take 1 tablet (1 mg total) by mouth daily. 30 tablet 0    divalproex (DEPAKOTE ER) 500 MG 24 hr tablet Take 500 mg by mouth 2 (two) times a day.      ondansetron (ZOFRAN-ODT) 4 MG disintegrating tablet Take 4 mg by mouth every 8 (eight) hours as needed.      promethazine (PHENERGAN) 12.5 MG tablet Take 12.5 mg by mouth every 6 (six) hours as needed.      QUEtiapine (SEROQUEL) 300 MG tablet Take 300 mg by mouth at bedtime.      valACYclovir (VALTREX) 500 MG tablet valacyclovir 500 mg tablet  Review of Systems  Constitutional: Negative.   Gastrointestinal:  Positive for nausea and vomiting. Negative for abdominal pain, constipation and diarrhea.  Genitourinary: Negative.   Physical Exam   Blood pressure 124/68, pulse 70, temperature 98.4 F (36.9 C), temperature source Axillary, resp. rate 17, height 5\' 4"  (1.626 m), weight 67.7 kg, SpO2 100 %, unknown if currently breastfeeding.  Physical Exam Vitals and nursing note reviewed.  Constitutional:      General: She is not in acute distress.    Appearance: Normal appearance. She is not ill-appearing or toxic-appearing.  HENT:     Head: Normocephalic and atraumatic.  Eyes:     General: No scleral icterus.    Conjunctiva/sclera: Conjunctivae normal.  Pulmonary:     Effort: Pulmonary effort is normal. No respiratory distress.  Neurological:     General: No focal deficit present.     Mental Status: She is alert.  Psychiatric:        Mood and Affect: Mood normal.        Behavior: Behavior normal.    MAU Course  Procedures Results for orders placed or performed during the hospital encounter of 05/10/21 (from the past 24 hour(s))  Urinalysis, Routine w reflex microscopic Urine, Clean Catch     Status: Abnormal    Collection Time: 05/10/21  5:32 PM  Result Value Ref Range   Color, Urine YELLOW YELLOW   APPearance CLEAR CLEAR   Specific Gravity, Urine >1.030 (H) 1.005 - 1.030   pH 6.0 5.0 - 8.0   Glucose, UA NEGATIVE NEGATIVE mg/dL   Hgb urine dipstick NEGATIVE NEGATIVE   Bilirubin Urine NEGATIVE NEGATIVE   Ketones, ur >80 (A) NEGATIVE mg/dL   Protein, ur NEGATIVE NEGATIVE mg/dL   Nitrite NEGATIVE NEGATIVE   Leukocytes,Ua NEGATIVE NEGATIVE    MDM Patient given IV fluids, phenergan, pepcid, & scopolamine patch. Not vomiting in MAU. Tolerated crackers & ginger ale. Will add pepcid & scop to regimen   Assessment and Plan   1. Nausea and vomiting during pregnancy prior to [redacted] weeks gestation   2. [redacted] weeks gestation of pregnancy    -rx scop patch & pepcid -refilled phenergan -discussed reasons to return to [facility] 05/10/2021, 8:35 PM

## 2021-05-10 NOTE — MAU Note (Signed)
Just really bad nausea and vomiting. Unable to hold anything down the last 2 days.  Was given Promethazine, not working. Reports wt is down 10 #. Never had anything like this with first.  Really feels weak, no energy.  No pain.

## 2021-05-11 ENCOUNTER — Ambulatory Visit (HOSPITAL_COMMUNITY): Payer: Medicaid Other | Admitting: Licensed Clinical Social Worker

## 2021-05-18 ENCOUNTER — Ambulatory Visit (HOSPITAL_COMMUNITY): Payer: Medicaid Other | Admitting: Physician Assistant

## 2021-05-24 ENCOUNTER — Encounter: Payer: Medicaid Other | Admitting: Nurse Practitioner

## 2021-05-24 NOTE — Patient Instructions (Signed)

## 2021-05-24 NOTE — Progress Notes (Signed)
[facility]-ERROR.  ?

## 2021-05-26 ENCOUNTER — Telehealth: Payer: Medicaid Other | Admitting: Family Medicine

## 2021-05-26 DIAGNOSIS — B001 Herpesviral vesicular dermatitis: Secondary | ICD-10-CM | POA: Diagnosis not present

## 2021-05-26 MED ORDER — VALACYCLOVIR HCL 1 G PO TABS: 2000.0000 mg | ORAL_TABLET | Freq: Two times a day (BID) | ORAL | 0 refills | Status: AC

## 2021-05-26 NOTE — Progress Notes (Signed)

## 2021-06-06 ENCOUNTER — Telehealth: Payer: Medicaid Other | Admitting: Physician Assistant

## 2021-06-06 DIAGNOSIS — B001 Herpesviral vesicular dermatitis: Secondary | ICD-10-CM

## 2021-06-06 MED ORDER — ACYCLOVIR 5 % EX OINT: 1.0000 "application " | TOPICAL_OINTMENT | CUTANEOUS | 0 refills | Status: DC

## 2021-06-06 NOTE — Progress Notes (Signed)
We are sorry that you are not feeling well.  Here is how we plan to help!  Based on what you have shared with me it does look like you have a viral infection.    Most cold sores or fever blisters are small fluid filled blisters around the mouth caused by herpes simplex virus.  The most common strain of the virus causing cold sores is herpes simplex virus 1.  It can be spread by skin contact, sharing eating utensils, or even sharing towels.  Cold sores are contagious to other people until dry. (Approximately 5-7 days).  Wash your hands. You can spread the virus to your eyes through handling your contact lenses after touching the lesions.  Most people experience pain at the sight or tingling sensations in their lips that may begin before the ulcers erupt.  Herpes simplex is treatable but not curable.  It may lie dormant for a long time and then reappear due to stress or prolonged sun exposure.  Many patients have success in treating their cold sores with an over the counter topical called Abreva.  You may apply the cream up to 5 times daily (maximum 10 days) until healing occurs.  If you would like to use an oral antiviral medication to speed the healing of your cold sore, I have sent a prescription to your local pharmacy Acyclovir 5% external ointment. Apply topically every 3 hours as needed for 2-3 days. If symptoms persist please be seen in person.  HOME CARE:  Wash your hands frequently. Do not pick at or rub the sore. Don't open the blisters. Avoid kissing other people during this time. Avoid sharing drinking glasses, eating utensils, or razors. Do not handle contact lenses unless you have thoroughly washed your hands with soap and warm water! Avoid oral sex during this time.  Herpes from sores on your mouth can spread to your partner's genital area. Avoid contact with anyone who has eczema or a weakened immune system. Cold sores are often triggered by exposure to intense sunlight, use a lip  balm containing a sunscreen (SPF 30 or higher).  GET HELP RIGHT AWAY IF:  Blisters look infected. Blisters occur near or in the eye. Symptoms last longer than 10 days. Your symptoms become worse.  MAKE SURE YOU:  Understand these instructions. Will watch your condition. Will get help right away if you are not doing well or get worse.    Your e-visit answers were reviewed by a board certified advanced clinical practitioner to complete your personal care plan.  Depending upon the condition, your plan could have  Included both over the counter or prescription medications.    Please review your pharmacy choice.  Be sure that the pharmacy you have chosen is open so that you can pick up your prescription now.  If there is a problem you can message your provider in MyChart to have the prescription routed to another pharmacy.    Your safety is important to [place].  If you have drug allergies check our prescription carefully.  For the next 24 hours you can use MyChart to ask questions about today's visit, request a non-urgent call back, or ask for a work or school excuse from your e-visit provider.  You will get an email in the next two days asking about your experience.  I hope that your e-visit has been valuable and will speed your recovery.  I provided 5 minutes of non face-to-face time during this encounter for chart review and documentation.

## 2021-09-03 ENCOUNTER — Encounter (HOSPITAL_COMMUNITY): Payer: Self-pay | Admitting: Obstetrics and Gynecology

## 2021-09-03 ENCOUNTER — Inpatient Hospital Stay (HOSPITAL_COMMUNITY)
Admission: AD | Admit: 2021-09-03 | Discharge: 2021-09-03 | Disposition: A | Discharge status: Home / Self Care | Payer: Medicaid Other | Attending: Obstetrics and Gynecology | Admitting: Obstetrics and Gynecology

## 2021-09-03 DIAGNOSIS — Z3A26 26 weeks gestation of pregnancy: Secondary | ICD-10-CM | POA: Diagnosis not present

## 2021-09-03 DIAGNOSIS — O26892 Other specified pregnancy related conditions, second trimester: Secondary | ICD-10-CM | POA: Insufficient documentation

## 2021-09-03 DIAGNOSIS — N898 Other specified noninflammatory disorders of vagina: Secondary | ICD-10-CM | POA: Diagnosis present

## 2021-09-03 DIAGNOSIS — Z3689 Encounter for other specified antenatal screening: Secondary | ICD-10-CM

## 2021-09-03 LAB — WET PREP, GENITAL
Clue Cells Wet Prep HPF POC: NONE SEEN
Sperm: NONE SEEN
Trich, Wet Prep: NONE SEEN
WBC, Wet Prep HPF POC: >=10 — AB (ref <10)
Yeast Wet Prep HPF POC: NONE SEEN

## 2021-09-03 LAB — URINALYSIS, ROUTINE W REFLEX MICROSCOPIC
Bilirubin Urine: NEGATIVE
Glucose, UA: 50 mg/dL — AB
Hgb urine dipstick: NEGATIVE
Ketones, ur: NEGATIVE mg/dL
Leukocytes,Ua: NEGATIVE
Nitrite: NEGATIVE
Protein, ur: NEGATIVE mg/dL
Specific Gravity, Urine: 1.019 (ref 1.005–1.030)
pH: 7 (ref 5.0–8.0)

## 2021-09-03 NOTE — MAU Note (Addendum)
.  Morgan Galloway is a 31 y.o. at [redacted]w[redacted]d here in MAU reporting: gush of clear watery fluid at 2000 last night after she got out of the shower. States it got on her rug. No leaking since. Denies recent intercourse. No VB. Denies pain. +FM ? ?Pain score: 0 ?Vitals:  ? 09/03/21 0931  ?BP: (!) 115/55  ?Pulse: (!) 103  ?Resp: 15  ?Temp: 98.1 ?F (36.7 ?C)  ?SpO2: 100%  ?   ?FHT:147 ?Lab orders placed from triage:  UA ?

## 2021-09-03 NOTE — MAU Provider Note (Signed)
?History  ?  ? ?CSN: [id number] ? ?Arrival date and time: 09/03/21 [id number] ? ? Event Date/Time  ? First Provider Initiated Contact with Patient 09/03/21 1016   ?  ? ?Chief Complaint  ?Patient presents with  ? Rupture of Membranes  ? ?Morgan Galloway is a 31 y.o. [id number] at [redacted]w[redacted]d who presents to MAU for vaginal discharge. Patient states last night she was getting out of the shower and felt something come out of her vagina. Patient states she reached down with her hand to check out the fluid and it was white and watery, but states that her hand was also wet at the time. Patient then states she stepped out of the shower and the same discharge fell on to the rug. Patient states it did not have an odor. Patient states she did not think her water broke, but she called her OB this morning and was told to come to MAU for evaluation. Patient states she has not been wearing a pad or needing to change her clothes since this time and has not experienced any additional discharge since that time. ? ?Pt denies VB, LOF, ctx, decreased FM, vaginal odor/itching. ?Pt denies N/V, abdominal pain, constipation, diarrhea, or urinary problems. ?Pt denies fever, chills, fatigue, sweating or changes in appetite. ?Pt denies SOB or chest pain. ?Pt denies dizziness, HA, light-headedness, weakness. ? ?Problems this pregnancy include: pt states her cervix is being monitored via [place], but that she does not know why ?Prenatal care provider? [facility] ? ? ?OB History   ? ? Gravida  ?7  ? Para  ?2  ? Term  ?2  ? Preterm  ?0  ? AB  ?4  ? Living  ?2  ?  ? ? SAB  ?3  ? IAB  ?   ? Ectopic  ?   ? Multiple  ?0  ? Live Births  ?2  ?   ?  ?  ? ? ?Past Medical History:  ?Diagnosis Date  ? Anxiety   ? Bipolar 1 disorder, manic, mild ([place])   ? Chronic kidney disease   ? Depression   ? Zoloft in past; stopped due to potential of pregnancy.  ? Gonorrhea   ? Headache   ? Vaginal Pap smear, abnormal   ? f/u ok  ? ? ?Past Surgical History:  ?Procedure Laterality Date  ?  WISDOM TOOTH EXTRACTION    ? ? ?Family History  ?Problem Relation Age of Onset  ? Bipolar disorder Mother   ? Stroke Father 9  ? Bipolar disorder Sister   ? Healthy Daughter   ? Healthy Son   ? Cancer Maternal Grandmother   ? Other Neg Hx   ? ? ?Social History  ? ?Tobacco Use  ? Smoking status: Former  ?  Packs/day: 0.50  ?  Years: 15.00  ?  Pack years: 7.50  ?  Types: Cigarettes  ? Smokeless tobacco: Never  ?Vaping Use  ? Vaping Use: Never used  ?Substance Use Topics  ? Alcohol use: Not Currently  ?  Comment: 3-5 beers  ? Drug use: Yes  ?  Types: Marijuana  ?  Comment: last used 08/27/21  ? ? ?Allergies: No Known Allergies ? ?Medications Prior to Admission  ?Medication Sig Dispense Refill Last Dose  ? hydrOXYzine (ATARAX) 25 MG tablet Take 25 mg by mouth daily.     ? Prenatal Vit-Fe Fumarate-FA (PRENATAL MULTIVITAMIN) TABS tablet Take 1 tablet by mouth daily at 12 noon.     ?  acyclovir ointment (ZOVIRAX) 5 % Apply 1 application topically every 3 (three) hours. 15 g 0   ? cephALEXin (KEFLEX) 250 MG capsule Take by mouth.     ? clonazePAM (KLONOPIN) 1 MG tablet Take 1 tablet (1 mg total) by mouth daily. 30 tablet 0   ? divalproex (DEPAKOTE ER) 500 MG 24 hr tablet Take 500 mg by mouth 2 (two) times a day.     ? ondansetron (ZOFRAN-ODT) 4 MG disintegrating tablet Take 4 mg by mouth every 8 (eight) hours as needed.     ? promethazine (PHENERGAN) 25 MG tablet Take 1 tablet (25 mg total) by mouth every 6 (six) hours as needed for nausea or vomiting. 30 tablet 1   ? QUEtiapine (SEROQUEL) 300 MG tablet Take 300 mg by mouth at bedtime.     ? scopolamine (TRANSDERM-SCOP) 1 MG/3DAYS Place 1 patch (1.5 mg total) onto the skin every 3 (three) days. 4 patch 1   ? ? ?Review of Systems  ?Constitutional:  Negative for chills, diaphoresis, fatigue and fever.  ?Eyes:  Negative for visual disturbance.  ?Respiratory:  Negative for shortness of breath.   ?Cardiovascular:  Negative for chest pain.  ?Gastrointestinal:  Negative for  abdominal pain, constipation, diarrhea, nausea and vomiting.  ?Genitourinary:  Positive for vaginal discharge. Negative for dysuria, flank pain, frequency, pelvic pain, urgency and vaginal bleeding.  ?Neurological:  Negative for dizziness, weakness, light-headedness and headaches.  ? ?Physical Exam  ? ?Blood pressure (!) 117/58, pulse 78, temperature 98.1 ?F (36.7 ?C), temperature source Oral, resp. rate 16, height 5\' 4"  (1.626 m), weight 70.6 kg, SpO2 100 %, unknown if currently breastfeeding. ? ?Patient Vitals for the past 24 hrs: ? BP Temp Temp src Pulse Resp SpO2 Height Weight  ?09/03/21 1040 (!) 117/58 -- -- 78 16 -- -- --  ?09/03/21 0947 114/64 -- -- 97 -- -- -- --  ?09/03/21 0931 (!) 115/55 98.1 ?F (36.7 ?C) Oral (!) 103 15 100 % -- --  ?09/03/21 0930 -- -- -- -- -- -- 5\' 4"  (1.626 m) 70.6 kg  ? ?Physical Exam ?Vitals and nursing note reviewed. Exam conducted with a chaperone present.  ?Constitutional:   ?   General: She is not in acute distress. ?   Appearance: Normal appearance. She is not ill-appearing, toxic-appearing or diaphoretic.  ?HENT:  ?   Head: Normocephalic and atraumatic.  ?Pulmonary:  ?   Effort: Pulmonary effort is normal.  ?Genitourinary: ?   General: Normal vulva.  ?   Labia:     ?   Right: No rash, tenderness, lesion or injury.     ?   Left: No rash, tenderness, lesion or injury.   ?   Vagina: Normal.  ?   Cervix: Normal.  ?   Comments: CE: long/closed/posterior ?Skin: ?   General: Skin is warm and dry.  ?Neurological:  ?   Mental Status: She is alert and oriented to person, place, and time.  ?Psychiatric:     ?   Mood and Affect: Mood normal.     ?   Behavior: Behavior normal.     ?   Thought Content: Thought content normal.     ?   Judgment: Judgment normal.  ? ?Results for orders placed or performed during the hospital encounter of 09/03/21 (from the past 24 hour(s))  ?Urinalysis, Routine w reflex microscopic Urine, Clean Catch     Status: Abnormal  ? Collection Time: 09/03/21  9:47 AM   ?Result Value  Ref Range  ? Color, Urine YELLOW YELLOW  ? APPearance CLOUDY (A) CLEAR  ? Specific Gravity, Urine 1.019 1.005 - 1.030  ? pH 7.0 5.0 - 8.0  ? Glucose, UA 50 (A) NEGATIVE mg/dL  ? Hgb urine dipstick NEGATIVE NEGATIVE  ? Bilirubin Urine NEGATIVE NEGATIVE  ? Ketones, ur NEGATIVE NEGATIVE mg/dL  ? Protein, ur NEGATIVE NEGATIVE mg/dL  ? Nitrite NEGATIVE NEGATIVE  ? Leukocytes,Ua NEGATIVE NEGATIVE  ? ?MAU Course  ?Procedures ? ?MDM ?-vaginal discharge, no concern for rupture ?-speculum exam with normal discharge ?-cervix long/closed/posterior on exam ?-WetPrEP/GC/CT collected, pt wishes to wait at home for results ?EFM: reactive ?      -baseline: 150 ?      -variability: moderate ?      -accels: present, 10x10 ?      -decels: absent ?      -TOCO: irritability ?-pt discharged to home in stable condition ? ?Assessment and Plan  ? ?1. Vaginal discharge in pregnancy in second trimester   ?2. [redacted] weeks gestation of pregnancy   ?3. NST (non-stress test) reactive   ? ?Allergies as of 09/03/2021   ?No Known Allergies ?  ? ?  ?Medication List  ?  ? ?TAKE these medications   ? ?acyclovir ointment 5 % ?Commonly known as: Zovirax ?Apply 1 application topically every 3 (three) hours. ?  ?cephALEXin 250 MG capsule ?Commonly known as: KEFLEX ?Take by mouth. ?  ?clonazePAM 1 MG tablet ?Commonly known as: KLONOPIN ?Take 1 tablet (1 mg total) by mouth daily. ?  ?divalproex 500 MG 24 hr tablet ?Commonly known as: DEPAKOTE ER ?Take 500 mg by mouth 2 (two) times a day. ?  ?hydrOXYzine 25 MG tablet ?Commonly known as: ATARAX ?Take 25 mg by mouth daily. ?  ?ondansetron 4 MG disintegrating tablet ?Commonly known as: ZOFRAN-ODT ?Take 4 mg by mouth every 8 (eight) hours as needed. ?  ?prenatal multivitamin Tabs tablet ?Take 1 tablet by mouth daily at 12 noon. ?  ?promethazine 25 MG tablet ?Commonly known as: PHENERGAN ?Take 1 tablet (25 mg total) by mouth every 6 (six) hours as needed for nausea or vomiting. ?  ?QUEtiapine 300 MG  tablet ?Commonly known as: SEROQUEL ?Take 300 mg by mouth at bedtime. ?  ?scopolamine 1 MG/3DAYS ?Commonly known as: TRANSDERM-SCOP ?Place 1 patch (1.5 mg total) onto the skin every 3 (three) days. ?  ? ?  ? ?-return

## 2021-09-05 LAB — GC/CHLAMYDIA PROBE AMP (~~LOC~~) NOT AT ARMC
Chlamydia: NEGATIVE
Comment: NEGATIVE
Comment: NORMAL
Neisseria Gonorrhea: NEGATIVE

## 2021-09-15 LAB — OB RESULTS CONSOLE RPR: RPR: NONREACTIVE

## 2021-10-26 LAB — OB RESULTS CONSOLE GC/CHLAMYDIA: Neisseria Gonorrhea: NEGATIVE

## 2021-11-15 LAB — OB RESULTS CONSOLE GBS: GBS: NEGATIVE

## 2021-11-21 ENCOUNTER — Inpatient Hospital Stay (HOSPITAL_COMMUNITY): Payer: Medicaid Other | Admitting: Anesthesiology

## 2021-11-21 ENCOUNTER — Other Ambulatory Visit: Payer: Self-pay

## 2021-11-21 ENCOUNTER — Inpatient Hospital Stay (HOSPITAL_COMMUNITY)
Admission: AD | Admit: 2021-11-21 | Discharge: 2021-11-24 | DRG: 797 | Disposition: A | Discharge status: Home / Self Care | Payer: Medicaid Other | Attending: Obstetrics & Gynecology | Admitting: Obstetrics & Gynecology

## 2021-11-21 ENCOUNTER — Encounter (HOSPITAL_COMMUNITY): Payer: Self-pay | Admitting: Obstetrics and Gynecology

## 2021-11-21 DIAGNOSIS — O26893 Other specified pregnancy related conditions, third trimester: Secondary | ICD-10-CM | POA: Diagnosis present

## 2021-11-21 DIAGNOSIS — O9832 Other infections with a predominantly sexual mode of transmission complicating childbirth: Secondary | ICD-10-CM | POA: Diagnosis present

## 2021-11-21 DIAGNOSIS — O1414 Severe pre-eclampsia complicating childbirth: Secondary | ICD-10-CM | POA: Diagnosis present

## 2021-11-21 DIAGNOSIS — A6000 Herpesviral infection of urogenital system, unspecified: Secondary | ICD-10-CM | POA: Diagnosis present

## 2021-11-21 DIAGNOSIS — Z302 Encounter for sterilization: Secondary | ICD-10-CM

## 2021-11-21 DIAGNOSIS — Z87891 Personal history of nicotine dependence: Secondary | ICD-10-CM | POA: Diagnosis not present

## 2021-11-21 DIAGNOSIS — Z3A37 37 weeks gestation of pregnancy: Secondary | ICD-10-CM | POA: Diagnosis not present

## 2021-11-21 LAB — COMPREHENSIVE METABOLIC PANEL
ALT: 12 U/L (ref 0–44)
AST: 23 U/L (ref 15–41)
Albumin: 3 g/dL — ABNORMAL LOW (ref 3.5–5.0)
Alkaline Phosphatase: 147 U/L — ABNORMAL HIGH (ref 38–126)
Anion gap: 8 (ref 5–15)
BUN: 9 mg/dL (ref 6–20)
CO2: 21 mmol/L — ABNORMAL LOW (ref 22–32)
Calcium: 9 mg/dL (ref 8.9–10.3)
Chloride: 108 mmol/L (ref 98–111)
Creatinine, Ser: 0.74 mg/dL (ref 0.44–1.00)
GFR, Estimated: >60 mL/min (ref >60)
Glucose, Bld: 95 mg/dL (ref 70–99)
Potassium: 3.5 mmol/L (ref 3.5–5.1)
Sodium: 137 mmol/L (ref 135–145)
Total Bilirubin: 0.5 mg/dL (ref 0.3–1.2)
Total Protein: 6.4 g/dL — ABNORMAL LOW (ref 6.5–8.1)

## 2021-11-21 LAB — URINALYSIS, ROUTINE W REFLEX MICROSCOPIC
Bilirubin Urine: NEGATIVE
Glucose, UA: NEGATIVE mg/dL
Hgb urine dipstick: NEGATIVE
Ketones, ur: NEGATIVE mg/dL
Nitrite: NEGATIVE
Protein, ur: 100 mg/dL — AB
Specific Gravity, Urine: 1.01 (ref 1.005–1.030)
pH: 7 (ref 5.0–8.0)

## 2021-11-21 LAB — CBC
HCT: 40.7 % (ref 36.0–46.0)
Hemoglobin: 13.7 g/dL (ref 12.0–15.0)
MCH: 30.2 pg (ref 26.0–34.0)
MCHC: 33.7 g/dL (ref 30.0–36.0)
MCV: 89.8 fL (ref 80.0–100.0)
Platelets: 133 10*3/uL — ABNORMAL LOW (ref 150–400)
RBC: 4.53 MIL/uL (ref 3.87–5.11)
RDW: 13.3 % (ref 11.5–15.5)
WBC: 8.1 10*3/uL (ref 4.0–10.5)
nRBC: 0 % (ref 0.0–0.2)

## 2021-11-21 LAB — TYPE AND SCREEN
ABO/RH(D): O POS
Antibody Screen: NEGATIVE

## 2021-11-21 LAB — LACTATE DEHYDROGENASE: LDH: 168 U/L (ref 98–192)

## 2021-11-21 LAB — URIC ACID: Uric Acid, Serum: 6 mg/dL (ref 2.5–7.1)

## 2021-11-21 LAB — RPR: RPR Ser Ql: NONREACTIVE

## 2021-11-21 MED ORDER — ZOLPIDEM TARTRATE 5 MG PO TABS: 5.0000 mg | ORAL_TABLET | Freq: Every evening | ORAL | Status: DC | PRN

## 2021-11-21 MED ORDER — PRENATAL MULTIVITAMIN CH
1.0000 | ORAL_TABLET | Freq: Every day | ORAL | Status: DC
Start: 2021-11-22 — End: 2021-11-24
  Administered 2021-11-23 – 2021-11-24 (×2): 1 via ORAL
  Filled 2021-11-21 (×2): qty 1

## 2021-11-21 MED ORDER — FENTANYL CITRATE (PF) 100 MCG/2ML IJ SOLN
INTRAMUSCULAR | Status: DC | PRN
  Administered 2021-11-21: 100 ug via EPIDURAL

## 2021-11-21 MED ORDER — LACTATED RINGERS IV SOLN: 500.0000 mL | Freq: Once | INTRAVENOUS | Status: DC

## 2021-11-21 MED ORDER — BUPIVACAINE HCL (PF) 0.25 % IJ SOLN
INTRAMUSCULAR | Status: DC | PRN
  Administered 2021-11-21: 8 mL via EPIDURAL

## 2021-11-21 MED ORDER — OXYCODONE-ACETAMINOPHEN 5-325 MG PO TABS: 2.0000 | ORAL_TABLET | ORAL | Status: DC | PRN

## 2021-11-21 MED ORDER — LIDOCAINE HCL (PF) 1 % IJ SOLN
INTRAMUSCULAR | Status: DC | PRN
  Administered 2021-11-21 (×2): 4 mL via EPIDURAL

## 2021-11-21 MED ORDER — DIPHENHYDRAMINE HCL 25 MG PO CAPS: 25.0000 mg | ORAL_CAPSULE | Freq: Four times a day (QID) | ORAL | Status: DC | PRN

## 2021-11-21 MED ORDER — CEPHALEXIN 250 MG PO CAPS
250.0000 mg | ORAL_CAPSULE | Freq: Four times a day (QID) | ORAL | Status: AC
Start: 2021-11-21 — End: 2021-11-23
  Administered 2021-11-21 – 2021-11-23 (×6): 250 mg via ORAL
  Filled 2021-11-21 (×10): qty 1

## 2021-11-21 MED ORDER — COCONUT OIL OIL: 1.0000 | TOPICAL_OIL | Status: DC | PRN

## 2021-11-21 MED ORDER — OXYCODONE HCL 5 MG PO TABS: 10.0000 mg | ORAL_TABLET | ORAL | Status: DC | PRN

## 2021-11-21 MED ORDER — SOD CITRATE-CITRIC ACID 500-334 MG/5ML PO SOLN
30.0000 mL | ORAL | Status: DC | PRN
  Administered 2021-11-21: 30 mL via ORAL
  Filled 2021-11-21: qty 30

## 2021-11-21 MED ORDER — FENTANYL CITRATE (PF) 100 MCG/2ML IJ SOLN
50.0000 ug | INTRAMUSCULAR | Status: DC | PRN
  Filled 2021-11-21: qty 2

## 2021-11-21 MED ORDER — ACETAMINOPHEN 325 MG PO TABS: 650.0000 mg | ORAL_TABLET | ORAL | Status: DC | PRN

## 2021-11-21 MED ORDER — ACETAMINOPHEN 325 MG PO TABS
650.0000 mg | ORAL_TABLET | ORAL | Status: DC | PRN
  Filled 2021-11-21: qty 2

## 2021-11-21 MED ORDER — LACTATED RINGERS IV SOLN: 500.0000 mL | INTRAVENOUS | Status: DC | PRN

## 2021-11-21 MED ORDER — SENNOSIDES-DOCUSATE SODIUM 8.6-50 MG PO TABS
2.0000 | ORAL_TABLET | Freq: Every day | ORAL | Status: DC
Start: 2021-11-22 — End: 2021-11-24
  Administered 2021-11-22 – 2021-11-24 (×3): 2 via ORAL
  Filled 2021-11-21 (×3): qty 2

## 2021-11-21 MED ORDER — LACTATED RINGERS IV SOLN: INTRAVENOUS | Status: DC

## 2021-11-21 MED ORDER — ACETAMINOPHEN 500 MG PO TABS
1000.0000 mg | ORAL_TABLET | Freq: Four times a day (QID) | ORAL | Status: DC | PRN
  Administered 2021-11-22 – 2021-11-23 (×3): 1000 mg via ORAL
  Filled 2021-11-21 (×4): qty 2

## 2021-11-21 MED ORDER — WITCH HAZEL-GLYCERIN EX PADS: 1.0000 | MEDICATED_PAD | CUTANEOUS | Status: DC | PRN

## 2021-11-21 MED ORDER — TETANUS-DIPHTH-ACELL PERTUSSIS 5-2.5-18.5 LF-MCG/0.5 IM SUSY: 0.5000 mL | PREFILLED_SYRINGE | Freq: Once | INTRAMUSCULAR | Status: DC

## 2021-11-21 MED ORDER — DIPHENHYDRAMINE HCL 50 MG/ML IJ SOLN
12.5000 mg | INTRAMUSCULAR | Status: DC | PRN
  Administered 2021-11-21: 12.5 mg via INTRAVENOUS
  Filled 2021-11-21: qty 1

## 2021-11-21 MED ORDER — LACTATED RINGERS IV BOLUS
500.0000 mL | Freq: Once | INTRAVENOUS | Status: AC
Start: 2021-11-21 — End: 2021-11-21
  Administered 2021-11-21: 500 mL via INTRAVENOUS

## 2021-11-21 MED ORDER — CYCLOBENZAPRINE HCL 10 MG PO TABS
10.0000 mg | ORAL_TABLET | Freq: Every day | ORAL | Status: DC
Start: 2021-11-21 — End: 2021-11-24
  Administered 2021-11-21: 10 mg via ORAL
  Filled 2021-11-21 (×3): qty 1

## 2021-11-21 MED ORDER — PHENYLEPHRINE 80 MCG/ML (10ML) SYRINGE FOR IV PUSH (FOR BLOOD PRESSURE SUPPORT): 80.0000 ug | PREFILLED_SYRINGE | INTRAVENOUS | Status: DC | PRN

## 2021-11-21 MED ORDER — EPHEDRINE 5 MG/ML INJ: 10.0000 mg | INTRAVENOUS | Status: DC | PRN

## 2021-11-21 MED ORDER — VALACYCLOVIR HCL 500 MG PO TABS
500.0000 mg | ORAL_TABLET | Freq: Two times a day (BID) | ORAL | Status: DC
  Administered 2021-11-21 – 2021-11-24 (×5): 500 mg via ORAL
  Filled 2021-11-21 (×6): qty 1

## 2021-11-21 MED ORDER — FENTANYL-BUPIVACAINE-NACL 0.5-0.125-0.9 MG/250ML-% EP SOLN
12.0000 mL/h | EPIDURAL | Status: DC | PRN
  Administered 2021-11-21: 12 mL/h via EPIDURAL
  Filled 2021-11-21: qty 250

## 2021-11-21 MED ORDER — DEXTROSE IN LACTATED RINGERS 5 % IV SOLN: INTRAVENOUS | Status: DC

## 2021-11-21 MED ORDER — ONDANSETRON HCL 4 MG/2ML IJ SOLN: 4.0000 mg | Freq: Four times a day (QID) | INTRAMUSCULAR | Status: DC | PRN

## 2021-11-21 MED ORDER — LIDOCAINE HCL (PF) 1 % IJ SOLN: 30.0000 mL | INTRAMUSCULAR | Status: DC | PRN

## 2021-11-21 MED ORDER — IBUPROFEN 600 MG PO TABS
600.0000 mg | ORAL_TABLET | Freq: Four times a day (QID) | ORAL | Status: DC
  Administered 2021-11-21 – 2021-11-23 (×6): 600 mg via ORAL
  Filled 2021-11-21 (×6): qty 1

## 2021-11-21 MED ORDER — OXYCODONE-ACETAMINOPHEN 5-325 MG PO TABS: 1.0000 | ORAL_TABLET | ORAL | Status: DC | PRN

## 2021-11-21 MED ORDER — OXYTOCIN BOLUS FROM INFUSION
333.0000 mL | Freq: Once | INTRAVENOUS | Status: AC
  Administered 2021-11-21: 333 mL via INTRAVENOUS

## 2021-11-21 MED ORDER — SIMETHICONE 80 MG PO CHEW
80.0000 mg | CHEWABLE_TABLET | ORAL | Status: DC | PRN
  Administered 2021-11-23: 80 mg via ORAL
  Filled 2021-11-21: qty 1

## 2021-11-21 MED ORDER — ONDANSETRON HCL 4 MG PO TABS: 4.0000 mg | ORAL_TABLET | ORAL | Status: DC | PRN

## 2021-11-21 MED ORDER — DIBUCAINE (PERIANAL) 1 % EX OINT: 1.0000 | TOPICAL_OINTMENT | CUTANEOUS | Status: DC | PRN

## 2021-11-21 MED ORDER — OXYTOCIN-SODIUM CHLORIDE 30-0.9 UT/500ML-% IV SOLN
2.5000 [IU]/h | INTRAVENOUS | Status: DC
  Administered 2021-11-21: 2.5 [IU]/h via INTRAVENOUS
  Filled 2021-11-21: qty 500

## 2021-11-21 MED ORDER — BENZOCAINE-MENTHOL 20-0.5 % EX AERO
1.0000 | INHALATION_SPRAY | CUTANEOUS | Status: DC | PRN
  Administered 2021-11-21: 1 via TOPICAL
  Filled 2021-11-21: qty 56

## 2021-11-21 MED ORDER — ONDANSETRON HCL 4 MG/2ML IJ SOLN: 4.0000 mg | INTRAMUSCULAR | Status: DC | PRN

## 2021-11-21 MED ORDER — OXYCODONE HCL 5 MG PO TABS
5.0000 mg | ORAL_TABLET | ORAL | Status: DC | PRN
  Administered 2021-11-24: 5 mg via ORAL
  Filled 2021-11-21: qty 1

## 2021-11-21 NOTE — Lactation Note (Signed)
This note was copied from a baby's chart. Lactation Consultation Note  Patient Name: Morgan Galloway [id number] Date: 11/21/2021 Reason for consult: Initial assessment;Early term 37-38.6wks;Infant < 6lbs;Breastfeeding assistance;Other (Comment) (Keflex, Valcyclovir) Age:31  LC reviewed feeding cues, LPTI guidelines to reduce calorie loss (birthweight < 6lbs), pumping and supplementation.  Currently birth parent wants to EBF and glucoses are stable.   Plan 1. To feed based on cues 8-12x 24hr period. BP to offer breasts and look for signs of milk transfer.  2. BP, need supplement arises, 12 ml or more per feeding EBM with pace bottle feeding and slow flow nipple.  Currently offering colostrum on spoon if unable to latch.   All questions answered at the end of the visit.  LC shared notes above with RN, [name].  BP has electric pump at home. Will work on [employer] certification in am.   Maternal Data Has patient been taught Hand Expression?: Yes  Feeding Mother's Current Feeding Choice: Breast Milk  LATCH Score Latch: Grasps breast easily, tongue down, lips flanged, rhythmical sucking.  Audible Swallowing: None  Type of Nipple: Everted at rest and after stimulation  Comfort (Breast/Nipple): Soft / non-tender  Hold (Positioning): Assistance needed to correctly position infant at breast and maintain latch.  LATCH Score: 7   Lactation Tools Discussed/Used    Interventions Interventions: Breast feeding basics reviewed;Skin to skin;Hand express;Expressed milk;DEBP;Education;LC Services brochure;Infant Driven Feeding Algorithm education;LPT handout/interventions  Discharge Pump: Personal WIC Program: Yes  Consult Status Consult Status: Follow-up Date: 11/22/21 Follow-up type: In-patient    Jaquae Rieves  [name] 11/21/2021, 6:14 PM

## 2021-11-21 NOTE — MAU Note (Signed)
..  Morgan Galloway is a 31 y.o. at [redacted]w[redacted]d here in MAU reporting: Reports contractions since midnight that are now every 8-34minutes. Denies vaginal bleeding or leaking of fluid. +FM  GYI:RSWNIOE in room  Lab orders placed from triage: labor

## 2021-11-21 NOTE — Anesthesia Procedure Notes (Signed)
Epidural Patient location during procedure: OB Start time: 11/21/2021 8:14 AM End time: 11/21/2021 8:17 AM  Staffing Anesthesiologist: Kaylyn Layer, MD Performed: anesthesiologist   Preanesthetic Checklist Completed: patient identified, IV checked, risks and benefits discussed, monitors and equipment checked, pre-op evaluation and timeout performed  Epidural Patient position: sitting Prep: DuraPrep and site prepped and draped Patient monitoring: continuous pulse ox, blood pressure and heart rate Approach: midline Location: L3-L4 Injection technique: LOR air  Needle:  Needle type: Tuohy  Needle gauge: 17 G Needle length: 9 cm Needle insertion depth: 5 cm Catheter type: closed end flexible Catheter size: 19 Gauge Catheter at skin depth: 10 cm Test dose: negative and Other (1% lidocaine)  Assessment Events: blood not aspirated, injection not painful, no injection resistance, no paresthesia and negative IV test  Additional Notes Patient identified. Risks, benefits, and alternatives discussed with patient including but not limited to bleeding, infection, nerve damage, paralysis, failed block, incomplete pain control, headache, blood pressure changes, nausea, vomiting, reactions to medication, itching, and postpartum back pain. Confirmed with bedside nurse the patient's most recent platelet count. Confirmed with patient that they are not currently taking any anticoagulation, have any bleeding history, or any family history of bleeding disorders. Patient expressed understanding and wished to proceed. All questions were answered. Sterile technique was used throughout the entire procedure. Please see nursing notes for vital signs.   Crisp LOR on first pass. Test dose was given through epidural catheter and negative prior to continuing to dose epidural or start infusion. Warning signs of high block given to the patient including shortness of breath, tingling/numbness in hands, complete  motor block, or any concerning symptoms with instructions to call for help. Patient was given instructions on fall risk and not to get out of bed. All questions and concerns addressed with instructions to call with any issues or inadequate analgesia.  Reason for block:procedure for pain

## 2021-11-21 NOTE — MAU Note (Signed)
Transferred via WC in stable condition to LD room 217. Report given per MAU charge nurse

## 2021-11-21 NOTE — H&P (Addendum)
Morgan Galloway is a 31 y.o. female presenting for contractions and bloody show. OB History     Gravida  7   Para  2   Term  2   Preterm  0   AB  4   Living  2      SAB  3   IAB      Ectopic      Multiple  0   Live Births  2          Past Medical History:  Diagnosis Date   Anxiety    Bipolar 1 disorder, manic, mild ([facility])    Chronic kidney disease    Depression    Zoloft in past; stopped due to potential of pregnancy.   Gonorrhea    Headache    Vaginal Pap smear, abnormal    f/u ok   Past Surgical History:  Procedure Laterality Date   WISDOM TOOTH EXTRACTION     Family History: family history includes Bipolar disorder in her mother and sister; Cancer in her maternal grandmother; Healthy in her daughter and son; Stroke (age of onset: 32) in her father. Social History:  reports that she has quit smoking. Her smoking use included cigarettes. She has a 7.50 pack-year smoking history. She has never used smokeless tobacco. She reports that she does not currently use alcohol. She reports current drug use. Drug: Marijuana.     Maternal Diabetes: No Genetic Screening: Declined Maternal Ultrasounds/Referrals: Normal Fetal Ultrasounds or other Referrals:  None Maternal Substance Abuse:  No Significant Maternal Medications:  Meds include: Other: valacyclovir Significant Maternal Lab Results:  Group B Strep negative Number of Prenatal Visits:greater than 3 verified prenatal visits Other Comments:   h/o taking seroquel, klonopin, clonazepam, depakote  Review of Systems Denies F/C/N/V/D History Dilation: 5.5 Effacement (%): 80 Station: -2 Exam by:: Alondra Twitty RN Blood pressure (!) 144/91, pulse 69, temperature 98.7 F (37.1 C), weight 76.7 kg, SpO2 100 %, unknown if currently breastfeeding. Exam Physical Exam  Lungs CTA CV RRR Abdomen gravid NT Ext no calf tenderness FHT 150, mod variablity, small accels, no decels  Prenatal labs: ABO, Rh:  O  positive Antibody:  negative Rubella:  Immune RPR:   NR HBsAg:   Negative HIV:   NR GBS:   Negative  Assessment/Plan: P2 at 37 3/7wks admitted in labor.  Plans an epidural. Cat 1 FHT.  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  Last outbreak in Feb.  Denies any prodromal s/sxs and RN reports no external lesions.  Rn also reported BP elevated.  Labs and Urine PCR ordered.  Denies and GHTN sxs.  Purcell Nails 11/21/2021, 5:46 AM

## 2021-11-21 NOTE — Anesthesia Preprocedure Evaluation (Signed)
Anesthesia Evaluation  Patient identified by MRN, date of birth, ID band Patient awake    Reviewed: Allergy & Precautions, Patient's Chart, lab work & pertinent test results  History of Anesthesia Complications Negative for: history of anesthetic complications  Airway Mallampati: II  TM Distance: >3 FB Neck ROM: Full    Dental no notable dental hx.    Pulmonary former smoker,    Pulmonary exam normal        Cardiovascular negative cardio ROS Normal cardiovascular exam     Neuro/Psych Anxiety Depression Bipolar Disorder negative neurological ROS     GI/Hepatic negative GI ROS, Neg liver ROS,   Endo/Other  negative endocrine ROS  Renal/GU negative Renal ROS  negative genitourinary   Musculoskeletal negative musculoskeletal ROS (+)   Abdominal   Peds  Hematology negative hematology ROS (+)   Anesthesia Other Findings Day of surgery medications reviewed with patient.  Reproductive/Obstetrics (+) Pregnancy                             Anesthesia Physical Anesthesia Plan  ASA: 2  Anesthesia Plan: Epidural   Post-op Pain Management:    Induction:   PONV Risk Score and Plan: Treatment may vary due to age or medical condition  Airway Management Planned: Natural Airway  Additional Equipment: Fetal Monitoring  Intra-op Plan:   Post-operative Plan:   Informed Consent: I have reviewed the patients History and Physical, chart, labs and discussed the procedure including the risks, benefits and alternatives for the proposed anesthesia with the patient or authorized representative who has indicated his/her understanding and acceptance.       Plan Discussed with:   Anesthesia Plan Comments:         Anesthesia Quick Evaluation

## 2021-11-21 NOTE — Lactation Note (Addendum)
This note was copied from a baby's chart. Lactation Consultation Note  Patient Name: Morgan Galloway [id number] Date: 11/21/2021   Age:32 hours  LC asked RN, [name] to see if birth parent like to be seen by lactation. Birth parent declined in L &D.   Maternal Data    Feeding    LATCH Score Latch: Grasps breast easily, tongue down, lips flanged, rhythmical sucking.  Audible Swallowing: None  Type of Nipple: Everted at rest and after stimulation  Comfort (Breast/Nipple): Soft / non-tender  Hold (Positioning): Assistance needed to correctly position infant at breast and maintain latch.  LATCH Score: 7   Lactation Tools Discussed/Used    Interventions    Discharge    Consult Status      [name]  [name] 11/21/2021, 4:37 PM

## 2021-11-22 ENCOUNTER — Inpatient Hospital Stay (HOSPITAL_COMMUNITY): Payer: Medicaid Other | Admitting: Anesthesiology

## 2021-11-22 ENCOUNTER — Encounter (HOSPITAL_COMMUNITY)
Admission: AD | Disposition: A | Discharge status: Home / Self Care | Payer: Self-pay | Source: Home / Self Care | Attending: Obstetrics & Gynecology

## 2021-11-22 ENCOUNTER — Encounter (HOSPITAL_COMMUNITY): Payer: Self-pay | Admitting: Obstetrics & Gynecology

## 2021-11-22 ENCOUNTER — Other Ambulatory Visit: Payer: Self-pay

## 2021-11-22 DIAGNOSIS — Z302 Encounter for sterilization: Secondary | ICD-10-CM | POA: Diagnosis not present

## 2021-11-22 HISTORY — PX: TUBAL LIGATION: SHX77

## 2021-11-22 LAB — COMPREHENSIVE METABOLIC PANEL
ALT: 13 U/L (ref 0–44)
AST: 32 U/L (ref 15–41)
Albumin: 3.2 g/dL — ABNORMAL LOW (ref 3.5–5.0)
Alkaline Phosphatase: 127 U/L — ABNORMAL HIGH (ref 38–126)
Anion gap: 9 (ref 5–15)
BUN: <5 mg/dL — ABNORMAL LOW (ref 6–20)
CO2: 24 mmol/L (ref 22–32)
Calcium: 9.1 mg/dL (ref 8.9–10.3)
Chloride: 104 mmol/L (ref 98–111)
Creatinine, Ser: 0.9 mg/dL (ref 0.44–1.00)
GFR, Estimated: >60 mL/min (ref >60)
Glucose, Bld: 109 mg/dL — ABNORMAL HIGH (ref 70–99)
Potassium: 3.8 mmol/L (ref 3.5–5.1)
Sodium: 137 mmol/L (ref 135–145)
Total Bilirubin: 0.5 mg/dL (ref 0.3–1.2)
Total Protein: 6.9 g/dL (ref 6.5–8.1)

## 2021-11-22 LAB — CBC
HCT: 36.1 % (ref 36.0–46.0)
Hemoglobin: 12.4 g/dL (ref 12.0–15.0)
MCH: 30.9 pg (ref 26.0–34.0)
MCHC: 34.3 g/dL (ref 30.0–36.0)
MCV: 90 fL (ref 80.0–100.0)
Platelets: 106 10*3/uL — ABNORMAL LOW (ref 150–400)
RBC: 4.01 MIL/uL (ref 3.87–5.11)
RDW: 13.4 % (ref 11.5–15.5)
WBC: 9.6 10*3/uL (ref 4.0–10.5)
nRBC: 0 % (ref 0.0–0.2)

## 2021-11-22 LAB — URIC ACID: Uric Acid, Serum: 5.3 mg/dL (ref 2.5–7.1)

## 2021-11-22 SURGERY — LIGATION, FALLOPIAN TUBE, POSTPARTUM
Anesthesia: Epidural

## 2021-11-22 MED ORDER — DEXAMETHASONE SODIUM PHOSPHATE 4 MG/ML IJ SOLN
INTRAMUSCULAR | Status: AC
  Filled 2021-11-22: qty 2

## 2021-11-22 MED ORDER — OXYTOCIN-SODIUM CHLORIDE 30-0.9 UT/500ML-% IV SOLN
INTRAVENOUS | Status: AC
  Filled 2021-11-22: qty 500

## 2021-11-22 MED ORDER — DEXAMETHASONE SODIUM PHOSPHATE 10 MG/ML IJ SOLN
INTRAMUSCULAR | Status: AC
  Filled 2021-11-22: qty 1

## 2021-11-22 MED ORDER — LABETALOL HCL 5 MG/ML IV SOLN: 20.0000 mg | INTRAVENOUS | Status: DC | PRN

## 2021-11-22 MED ORDER — PHENYLEPHRINE HCL-NACL 20-0.9 MG/250ML-% IV SOLN
INTRAVENOUS | Status: AC
  Filled 2021-11-22: qty 250

## 2021-11-22 MED ORDER — ONDANSETRON HCL 4 MG/2ML IJ SOLN
INTRAMUSCULAR | Status: AC
  Filled 2021-11-22: qty 2

## 2021-11-22 MED ORDER — MIDAZOLAM HCL 2 MG/2ML IJ SOLN
INTRAMUSCULAR | Status: AC
  Filled 2021-11-22: qty 2

## 2021-11-22 MED ORDER — BUPIVACAINE HCL (PF) 0.25 % IJ SOLN
INTRAMUSCULAR | Status: DC | PRN
  Administered 2021-11-22: 20 mL

## 2021-11-22 MED ORDER — DEXAMETHASONE SODIUM PHOSPHATE 10 MG/ML IJ SOLN
INTRAMUSCULAR | Status: DC | PRN
  Administered 2021-11-22: 8 mg via INTRAVENOUS

## 2021-11-22 MED ORDER — LABETALOL HCL 5 MG/ML IV SOLN
INTRAVENOUS | Status: AC
  Filled 2021-11-22: qty 8

## 2021-11-22 MED ORDER — HYDROMORPHONE HCL 1 MG/ML IJ SOLN: 0.2500 mg | INTRAMUSCULAR | Status: DC | PRN

## 2021-11-22 MED ORDER — LACTATED RINGERS IV SOLN: INTRAVENOUS | Status: DC

## 2021-11-22 MED ORDER — KETOROLAC TROMETHAMINE 30 MG/ML IJ SOLN
INTRAMUSCULAR | Status: AC
  Filled 2021-11-22: qty 1

## 2021-11-22 MED ORDER — LIDOCAINE-EPINEPHRINE (PF) 2 %-1:200000 IJ SOLN
INTRAMUSCULAR | Status: AC
  Filled 2021-11-22: qty 20

## 2021-11-22 MED ORDER — HYDRALAZINE HCL 20 MG/ML IJ SOLN: 10.0000 mg | INTRAMUSCULAR | Status: DC | PRN

## 2021-11-22 MED ORDER — PROPOFOL 10 MG/ML IV BOLUS
INTRAVENOUS | Status: AC
  Filled 2021-11-22: qty 20

## 2021-11-22 MED ORDER — DEXMEDETOMIDINE HCL IN NACL 80 MCG/20ML IV SOLN
INTRAVENOUS | Status: AC
  Filled 2021-11-22: qty 20

## 2021-11-22 MED ORDER — PROMETHAZINE HCL 25 MG/ML IJ SOLN: 6.2500 mg | INTRAMUSCULAR | Status: DC | PRN

## 2021-11-22 MED ORDER — DIPHENHYDRAMINE HCL 50 MG/ML IJ SOLN
12.5000 mg | Freq: Once | INTRAMUSCULAR | Status: AC
  Administered 2021-11-22: 12.5 mg via INTRAVENOUS

## 2021-11-22 MED ORDER — IBUPROFEN 600 MG PO TABS
600.0000 mg | ORAL_TABLET | Freq: Four times a day (QID) | ORAL | Status: DC | PRN
  Administered 2021-11-23 – 2021-11-24 (×2): 600 mg via ORAL
  Filled 2021-11-22 (×2): qty 1

## 2021-11-22 MED ORDER — DEXMEDETOMIDINE (PRECEDEX) IN NS 20 MCG/5ML (4 MCG/ML) IV SYRINGE
PREFILLED_SYRINGE | INTRAVENOUS | Status: DC | PRN
  Administered 2021-11-22: 8 ug via INTRAVENOUS

## 2021-11-22 MED ORDER — FAMOTIDINE 20 MG PO TABS
40.0000 mg | ORAL_TABLET | Freq: Once | ORAL | Status: AC
  Administered 2021-11-22: 40 mg via ORAL
  Filled 2021-11-22: qty 2

## 2021-11-22 MED ORDER — LACTATED RINGERS IV SOLN: INTRAVENOUS | Status: DC | PRN

## 2021-11-22 MED ORDER — FENTANYL CITRATE (PF) 100 MCG/2ML IJ SOLN
INTRAMUSCULAR | Status: AC
  Filled 2021-11-22: qty 2

## 2021-11-22 MED ORDER — MENTHOL 3 MG MT LOZG: 1.0000 | LOZENGE | OROMUCOSAL | Status: DC | PRN

## 2021-11-22 MED ORDER — OXYCODONE-ACETAMINOPHEN 5-325 MG PO TABS: 1.0000 | ORAL_TABLET | ORAL | Status: DC | PRN

## 2021-11-22 MED ORDER — MEPERIDINE HCL 25 MG/ML IJ SOLN: 6.2500 mg | INTRAMUSCULAR | Status: DC | PRN

## 2021-11-22 MED ORDER — MIDAZOLAM HCL 5 MG/5ML IJ SOLN
INTRAMUSCULAR | Status: DC | PRN
  Administered 2021-11-22 (×2): 1 mg via INTRAVENOUS

## 2021-11-22 MED ORDER — LIDOCAINE-EPINEPHRINE (PF) 2 %-1:200000 IJ SOLN
INTRAMUSCULAR | Status: DC | PRN
  Administered 2021-11-22 (×4): 5 mL via EPIDURAL

## 2021-11-22 MED ORDER — FENTANYL CITRATE (PF) 100 MCG/2ML IJ SOLN
INTRAMUSCULAR | Status: DC | PRN
  Administered 2021-11-22 (×2): 50 ug via INTRAVENOUS

## 2021-11-22 MED ORDER — NIFEDIPINE ER OSMOTIC RELEASE 30 MG PO TB24
30.0000 mg | ORAL_TABLET | Freq: Every day | ORAL | Status: DC
  Administered 2021-11-22 – 2021-11-24 (×3): 30 mg via ORAL
  Filled 2021-11-22 (×3): qty 1

## 2021-11-22 MED ORDER — LIDOCAINE HCL 1 % IJ SOLN
INTRAMUSCULAR | Status: DC | PRN
  Administered 2021-11-22: 20 mL

## 2021-11-22 MED ORDER — LABETALOL HCL 5 MG/ML IV SOLN
INTRAVENOUS | Status: AC
  Filled 2021-11-22: qty 4

## 2021-11-22 MED ORDER — ONDANSETRON HCL 4 MG/2ML IJ SOLN
INTRAMUSCULAR | Status: DC | PRN
  Administered 2021-11-22: 4 mg via INTRAVENOUS

## 2021-11-22 MED ORDER — METOCLOPRAMIDE HCL 10 MG PO TABS
10.0000 mg | ORAL_TABLET | Freq: Once | ORAL | Status: AC
  Administered 2021-11-22: 10 mg via ORAL
  Filled 2021-11-22: qty 1

## 2021-11-22 MED ORDER — LABETALOL HCL 5 MG/ML IV SOLN
20.0000 mg | Freq: Once | INTRAVENOUS | Status: AC
  Administered 2021-11-22: 20 mg via INTRAVENOUS

## 2021-11-22 MED ORDER — LIDOCAINE HCL (PF) 1 % IJ SOLN
INTRAMUSCULAR | Status: AC
  Filled 2021-11-22: qty 30

## 2021-11-22 MED ORDER — OXYCODONE HCL 5 MG/5ML PO SOLN: 5.0000 mg | Freq: Once | ORAL | Status: DC | PRN

## 2021-11-22 MED ORDER — LABETALOL HCL 5 MG/ML IV SOLN
40.0000 mg | INTRAVENOUS | Status: DC | PRN
  Administered 2021-11-22: 40 mg via INTRAVENOUS

## 2021-11-22 MED ORDER — PROPOFOL 10 MG/ML IV BOLUS
INTRAVENOUS | Status: DC | PRN
  Administered 2021-11-22: 150 mg via INTRAVENOUS

## 2021-11-22 MED ORDER — SUCCINYLCHOLINE CHLORIDE 200 MG/10ML IV SOSY
PREFILLED_SYRINGE | INTRAVENOUS | Status: DC | PRN
  Administered 2021-11-22: 140 mg via INTRAVENOUS

## 2021-11-22 MED ORDER — OXYCODONE HCL 5 MG PO TABS: 5.0000 mg | ORAL_TABLET | Freq: Once | ORAL | Status: DC | PRN

## 2021-11-22 MED ORDER — BUPIVACAINE HCL (PF) 0.25 % IJ SOLN
INTRAMUSCULAR | Status: AC
  Filled 2021-11-22: qty 30

## 2021-11-22 MED ORDER — PHENYLEPHRINE HCL (PRESSORS) 10 MG/ML IV SOLN
INTRAVENOUS | Status: DC | PRN
  Administered 2021-11-22: 80 ug via INTRAVENOUS

## 2021-11-22 MED ORDER — STERILE WATER FOR IRRIGATION IR SOLN
Status: DC | PRN
  Administered 2021-11-22: 1000 mL

## 2021-11-22 MED ORDER — FENTANYL CITRATE (PF) 100 MCG/2ML IJ SOLN
INTRAMUSCULAR | Status: DC | PRN
  Administered 2021-11-22: 100 ug via EPIDURAL

## 2021-11-22 MED ORDER — DIPHENHYDRAMINE HCL 50 MG/ML IJ SOLN
INTRAMUSCULAR | Status: AC
  Filled 2021-11-22: qty 1

## 2021-11-22 MED ORDER — SUCCINYLCHOLINE CHLORIDE 200 MG/10ML IV SOSY
PREFILLED_SYRINGE | INTRAVENOUS | Status: AC
  Filled 2021-11-22: qty 10

## 2021-11-22 MED ORDER — KETOROLAC TROMETHAMINE 30 MG/ML IJ SOLN
INTRAMUSCULAR | Status: DC | PRN
  Administered 2021-11-22: 30 mg via INTRAVENOUS

## 2021-11-22 MED ORDER — HYDRALAZINE HCL 20 MG/ML IJ SOLN: 5.0000 mg | INTRAMUSCULAR | Status: DC | PRN

## 2021-11-22 MED ORDER — PHENYLEPHRINE 80 MCG/ML (10ML) SYRINGE FOR IV PUSH (FOR BLOOD PRESSURE SUPPORT)
PREFILLED_SYRINGE | INTRAVENOUS | Status: AC
  Filled 2021-11-22: qty 10

## 2021-11-22 MED ORDER — KETOROLAC TROMETHAMINE 30 MG/ML IJ SOLN: 30.0000 mg | Freq: Once | INTRAMUSCULAR | Status: DC

## 2021-11-22 SURGICAL SUPPLY — 27 items
ADH SKN CLS APL DERMABOND .7 (GAUZE/BANDAGES/DRESSINGS) ×1
CLIP FILSHIE TUBAL LIGA STRL (Clip) IMPLANT
CLOTH BEACON ORANGE TIMEOUT ST (SAFETY) ×2 IMPLANT
DERMABOND ADVANCED (GAUZE/BANDAGES/DRESSINGS) ×1
DERMABOND ADVANCED .7 DNX12 (GAUZE/BANDAGES/DRESSINGS) ×1 IMPLANT
DRSG OPSITE POSTOP 3X4 (GAUZE/BANDAGES/DRESSINGS) ×2 IMPLANT
DURAPREP 26ML APPLICATOR (WOUND CARE) ×2 IMPLANT
ELECT REM PT RETURN 9FT ADLT (ELECTROSURGICAL) ×2
ELECTRODE REM PT RTRN 9FT ADLT (ELECTROSURGICAL) ×1 IMPLANT
GLOVE BIO SURGEON STRL SZ 6.5 (GLOVE) ×2 IMPLANT
GLOVE BIOGEL PI IND STRL 7.0 (GLOVE) ×3 IMPLANT
GLOVE BIOGEL PI INDICATOR 7.0 (GLOVE) ×3
GOWN STRL REUS W/TWL LRG LVL3 (GOWN DISPOSABLE) ×4 IMPLANT
LIGASURE IMPACT 36 18CM CVD LR (INSTRUMENTS) ×1 IMPLANT
NEEDLE HYPO 22GX1.5 SAFETY (NEEDLE) ×2 IMPLANT
NS IRRIG 1000ML POUR BTL (IV SOLUTION) ×2 IMPLANT
PACK ABDOMINAL MINOR (CUSTOM PROCEDURE TRAY) ×2 IMPLANT
PENCIL BUTTON HOLSTER BLD 10FT (ELECTRODE) ×2 IMPLANT
PROTECTOR NERVE ULNAR (MISCELLANEOUS) ×2 IMPLANT
SPONGE LAP 4X18 RFD (DISPOSABLE) ×2 IMPLANT
SUT MON AB 4-0 PS1 27 (SUTURE) ×2 IMPLANT
SUT PLAIN 0 NONE (SUTURE) IMPLANT
SUT VICRYL 0 UR6 27IN ABS (SUTURE) ×2 IMPLANT
SYR CONTROL 10ML LL (SYRINGE) ×2 IMPLANT
TOWEL OR 17X24 6PK STRL BLUE (TOWEL DISPOSABLE) ×4 IMPLANT
TRAY FOLEY CATH SILVER 14FR (SET/KITS/TRAYS/PACK) ×2 IMPLANT
WATER STERILE IRR 1000ML POUR (IV SOLUTION) ×2 IMPLANT

## 2021-11-22 NOTE — Lactation Note (Signed)
This note was copied from a baby's chart. Lactation Consultation Note  Patient Name: Morgan Galloway [id number] Date: 11/22/2021   Age:32 hours  LC attempted to see, Birth parent in the OR for a surgical procedure.   Maternal Data    Feeding    LATCH Score                    Lactation Tools Discussed/Used    Interventions    Discharge    Consult Status      Morgan Scali  Galloway 11/22/2021, 3:08 PM

## 2021-11-22 NOTE — Transfer of Care (Signed)
Immediate Anesthesia Transfer of Care Note  Patient: Morgan Galloway  Procedure(s) Performed: POST PARTUM TUBAL LIGATION  Patient Location: PACU  Anesthesia Type:General  Level of Consciousness: awake  Airway & Oxygen Therapy: Patient Spontanous Breathing  Post-op Assessment: Report given to RN and Post -op Vital signs reviewed and stable  Post vital signs: Reviewed and stable  Last Vitals:  Vitals Value Taken Time  BP 127/74 11/22/21 1328  Temp    Pulse 75 11/22/21 1330  Resp 15 11/22/21 1330  SpO2 97 % 11/22/21 1330  Vitals shown include unvalidated device data.  Last Pain:  Vitals:   11/22/21 1127  TempSrc: Oral  PainSc:          Complications: No notable events documented.

## 2021-11-22 NOTE — Progress Notes (Signed)
MD Progress Note  Called from PACU while in Main OR.   Patient with recurrent episodes of severe range BPs.  Patient received a total of 60 mg IV labetalol.   Patient denies headache, scotomata, blurry vision.  No RUQ pain.  No abdominal pain.   O:    11/22/2021    4:20 PM 11/22/2021    4:15 PM 11/22/2021    4:10 PM  Vitals   Systolic 163 147 951  Diastolic 93 89 90  Pulse 86 86 81    General: Alert awake and oriented, no acute distress  A/P: 31 yo s/p post partum salpingectomy / ppd#1 s/p SVD With new onset hypertension.  Will transfer to [facility] specialty care in case patient needs IV magnesium sulfate for seizure prophylaxis Will order preeclampsia labs Will start procardia 30 mg daily   Essie Hart, MD  11/22/2021 4:46 PM

## 2021-11-22 NOTE — Anesthesia Preprocedure Evaluation (Addendum)
Anesthesia Evaluation  Patient identified by MRN, date of birth, ID band Patient awake    Reviewed: Allergy & Precautions, NPO status , Patient's Chart, lab work & pertinent test results  History of Anesthesia Complications Negative for: history of anesthetic complications  Airway Mallampati: II  TM Distance: >3 FB Neck ROM: Full    Dental no notable dental hx. (+) Dental Advisory Given, Teeth Intact   Pulmonary former smoker,    Pulmonary exam normal        Cardiovascular negative cardio ROS Normal cardiovascular exam Rhythm:Regular Rate:Normal     Neuro/Psych  Headaches, PSYCHIATRIC DISORDERS Anxiety Depression Bipolar Disorder    GI/Hepatic negative GI ROS, Neg liver ROS,   Endo/Other  negative endocrine ROS  Renal/GU Renal disease  negative genitourinary   Musculoskeletal negative musculoskeletal ROS (+)   Abdominal   Peds  Hematology negative hematology ROS (+)   Anesthesia Other Findings Day of surgery medications reviewed with patient.  Reproductive/Obstetrics                           Anesthesia Physical  Anesthesia Plan  ASA: 2  Anesthesia Plan: Epidural   Post-op Pain Management: Ofirmev IV (intra-op)*, Toradol IV (intra-op)* and Epidural*   Induction:   PONV Risk Score and Plan: 3 and Treatment may vary due to age or medical condition, TIVA, Ondansetron, Dexamethasone and Scopolamine patch - Pre-op  Airway Management Planned: Natural Airway  Additional Equipment: Fetal Monitoring  Intra-op Plan:   Post-operative Plan:   Informed Consent: I have reviewed the patients History and Physical, chart, labs and discussed the procedure including the risks, benefits and alternatives for the proposed anesthesia with the patient or authorized representative who has indicated his/her understanding and acceptance.     Dental advisory given  Plan Discussed with:  CRNA  Anesthesia Plan Comments:       Anesthesia Quick Evaluation

## 2021-11-22 NOTE — Lactation Note (Signed)
This note was copied from a baby's chart. Lactation Consultation Note  Patient Name: Morgan Galloway [id number] Date: 11/22/2021 Reason for consult: Follow-up assessment;Mother's request;Difficult latch;Early term 37-38.6wks;Infant < 6lbs;Breastfeeding assistance;Other (Comment);RN request (PIH) Age:32 hours  BP concerned infant sleeping since 1300 after getting formula while she was in the OR. LC assisted giving drops of colostrum on a spoon, placing infant STS and observing a 15 minute feeding. Infant latch on football on one side and cross cradle prone to get more depth on the other.  Compression stripe noted on left and some soreness but nipple intact.   Infant had adequate urine and stool output and emesis resolved from earlier today.  LC shared feeding results of latch with RN, Franchot Gallo.     Maternal Data Has patient been taught Hand Expression?: Yes  Feeding Mother's Current Feeding Choice: Breast Milk Nipple Type: Extra Slow Flow  LATCH Score Latch: Repeated attempts needed to sustain latch, nipple held in mouth throughout feeding, stimulation needed to elicit sucking reflex.  Audible Swallowing: Spontaneous and intermittent  Type of Nipple: Everted at rest and after stimulation  Comfort (Breast/Nipple): Soft / non-tender  Hold (Positioning): No assistance needed to correctly position infant at breast.  LATCH Score: 9   Lactation Tools Discussed/Used Tools: Pump;Flanges Flange Size: 27 (BP stated increased flange size to 27. LC not able to check since worked on breastfeeding during visit.) Breast pump type: Manual Pump Education: Setup, frequency, and cleaning;Milk Storage Reason for Pumping: increase stimulation Pumping frequency: BP preference to use ad lib to offer colostrum if infant not latching.  Interventions Interventions: Breast feeding basics reviewed;Assisted with latch;Skin to skin;Breast massage;Hand express;Breast compression;Adjust  position;Support pillows;Position options;Expressed milk;Hand pump;Education;Visual merchandiser education  Discharge    Consult Status Consult Status: Follow-up Date: 11/23/21 Follow-up type: In-patient    [name]  [name] 11/22/2021, 7:23 PM

## 2021-11-22 NOTE — Clinical Social Work Maternal (Addendum)
CLINICAL SOCIAL WORK MATERNAL/CHILD NOTE  Patient Details  Name: Morgan Galloway MRN: [id number] Date of Birth: 07/14/1990  Date:  11/22/2021  Clinical Social Worker Initiating Note:  Kathrin Greathouse, [place] Date/Time: Initiated:  11/22/21/1045     Child's Name:  Morgan Galloway   Biological Parents:  Father, Mother (MOB: Morgan Galloway 07/15/1990, FOB: Morgan Galloway 10-14-1982)   Need for Interpreter:  None   Reason for Referral:  Current Substance Use/Substance Use During Pregnancy  , Behavioral Health Concerns   Address:  [place] [address]    Phone number:  [phone] (home)     Additional phone number:   Household Members/Support Persons (HM/SP):   Household Member/Support Person 1, Household Member/Support Person 2, Household Member/Support Person 3, Household Member/Support Person 4   HM/SP Name Relationship DOB or Age  HM/SP -1 Morgan Galloway Daugter 05-06-2017  HM/SP -2 Morgan Galloway Spouse 10-14-1982  HM/SP -3 Morgan Galloway Father 77  HM/SP -[place]. Son 08-25-2014  HM/SP -5        HM/SP -6        HM/SP -7        HM/SP -8          Natural Supports (not living in the home):  Parent   Professional Supports: Transport planner, Other (Comment) (Psychiatrist Eino Farber)   Employment: Full-time   Type of Work: [employer]   Education:  Other (comment) (Associates Degree)   Homebound arranged:    Museum/gallery curator Resources:  Medicaid   Other Resources:  Physicist, medical  , [place]   Cultural/Religious Considerations Which May Impact Care:    Strengths:  Ability to meet basic needs  , Home prepared for child  , Psychotropic Medications   Psychotropic Medications:  Other meds (Vistaril, Hx.)      Pediatrician:       Pediatrician List:   [facility]      Pediatrician Fax Number:    Risk Factors/Current Problems:  Mental Health Concerns  , Substance Use      Cognitive State:  Able to Concentrate  , Alert  , Linear Thinking  , Insightful     Mood/Affect:  Calm  , Comfortable  , Interested     CSW Assessment: CSW received consult hx anxiety/dep/bipolar. CSW met with MOB to offer support and complete assessment. CSW also noted THC use during the pregnancy. RN aware to place order for UDS and CDS.   CSW met with MOB at bedside and introduced CSW role. CSW observed MOB hand expressing breast milk and the infant was asleep at bedside. MOB presented calm and welcomed CSW visit. MOB confirmed that the demographic information on hospital file is correct. MOB reported that she lives with her spouse, children, and father. MOB identified her mom as a great support. MOB reported that she is employed at [employer] and receives [employer]. She plans to apply for [facility]. CSW inquired how MOB has been feeling since giving birth. MOB reported feeling "good, just tired." CSW inquired about MOB mental health history. MOB acknowledged that she has history of anxiety, depression and bipolar I with manic features. MOB reported during the beginning of the pregnancy she felt "Horrible, depressed and anxious." She started to feel better towards the end of the pregnancy.  MOB reported does not recall the last manic episode she had but believes  it was "some years ago. "MOB reported prior to the pregnancy she treated her symptoms with psychotropic medications Depakote, Seroquel and Clonazepam prescribed by psychiatrist Dr. Jo Hughes. She stopped the medication after learning about the pregnancy and feels it exacerbated to her symptoms of depression and anxiety during the beginning of the pregnancy. She is taking Vistaril at night to help sleep. MOB reported that since she is breastfeeding, she will not immediately resume the medication but will reach out to her psychiatrist to discuss her options that are safe while breastfeeding.  MOB reported in the past she has seen a therapist at [facility] which she felt was helpful and has access to a therapist if therapy is desired. CSW encouraged MOB to follow up with her psychiatrist to discuss medication management.  CSW discussed PPD symptoms and provided resources. MOB reported that she had PPD after the birth of her daughter about four years ago that lasted for eight months. MOB reported that had thoughts of wanting to harm herself so reached out to her provider for support and was started on medication. MOB expressed she felt much better after starting the medication. CSW encouraged MOB to reach out to a medical provider if concerns arise. CSW provided education regarding the baby blues period vs. perinatal mood disorders, discussed treatment and gave resources for mental health follow up if concerns arise.  CSW recommended MOB complete a self-evaluation during the postpartum time period using the New Mom Checklist from Postpartum Progress and encouraged MOB to contact a medical professional if symptoms are noted at any time. CSW assessed MOB for safety. MOB denied thoughts of harm to self and others.   CSW inquired about MOB substance use during the pregnancy. MOB reported that she used marijuana for the first five months of the pregnancy because it helped with nausea. The last time she used marijuana she was about 5-6 months pregnant. CSW informed MOB about the hospital drug screen policy. MOB made aware that CSW will monitor the infant's CDS/UDS and make a report to CPS, if warranted. MOB reported understanding and denied CPS history.   MOB reported she has all items for the infant including a bassinet where the infant will sleep. MOB has chosen [facility]. CSW provided review of Sudden Infant Death Syndrome (SIDS) precautions. CSW assessed MOB for additional needs. MOB reported no further need.   CSW will monitor the infant's CDS/UDS and make a report to CPS, if warranted.  CSW  identifies no further need for intervention and no barriers to discharge at this time.   CSW Plan/Description:  Sudden Infant Death Syndrome (SIDS) Education, CSW Will Continue to Monitor Umbilical Cord Tissue Drug Screen Results and Make Report if Warranted, Hospital Drug Screen Policy Information, Perinatal Mood and Anxiety Disorder (PMADs) Education, No Further Intervention Required/No Barriers to Discharge    Chenita Ruda A Verlaine Embry, LCSW 11/22/2021, 4:02 PM 

## 2021-11-22 NOTE — Progress Notes (Signed)
MD PROGRESS NOTE  Morgan Galloway [id number]  PPD# 1 s/p SVD Subjective:   Patient doing well and w/o complaints. She is tolerating PO fluid and solids w/o nausea or vomiting Bleeding is light Up ad lib / ambulatory / voiding w/o difficulty Feeding: Bottle and Breast. Mother and baby are bonding well.    Objective:   VS: BP 127/71 (BP Location: Right Arm)   Pulse (!) 53   Temp 98.3 F (36.8 C) (Oral)   Resp 18   Ht 5\' 4"  (1.626 m)   Wt 76.7 kg   SpO2 100%   Breastfeeding Unknown   BMI 29.02 kg/m   Physical Exam: Alert and oriented X3 Lungs: Clear and unlabored Heart: regular rate and rhythm / no mumurs Abdomen: soft, non-tender, non-distended  Fundus: firm, non-tender, U-1 Perineum: intact Lochia: minimal Extremities: No edema, no calf pain, tenderness, or cords  Labs:  Recent Labs    11/21/21 0547 11/22/21 0529  WBC 8.1 9.6  HGB 13.7 12.4  PLT 133* 106*   Blood type: --/--/O POS (07/31 0547) Rubella: Immune (01/11 0000)                     Assessment:  31 year old G50P3 PPD # 1 s/p NSVD doing well  Patient desires postpartum tubal ligation / bilateral salpingectomy  Pt was counseled on the risks, benefits, alternatives of permanent sterilization. We discussed the risks of the procedure include injury to include but not limited to: bowel/bladder/ureters, bleeding and infection, the risk of failure, and the related risk of ectopic pregnancy.  Patient counseled that the number one risk of permanent sterilization is regret.   Patient made aware that spousal vasectomy is a safer, easier and more effective form of permanent sterilization.  Other forms of birth control including IUD, OPCs, Nuvaring, Ortho Evra patch, Depo Provera, and Nexplanon were also discussed.  Pt understands options and risks.  She desires to proceed with postpartum tubal ligation via bilateral salpingectomy. All questions answered.  Consent signed, witnessed and placed into chart.     Plan:  Continue routine post partum care On call to OR for postpartum tubal Currently NPO No antibiotics necessary Epidural in place.   Anticipate D/C tomorrow    G11P1, MD 11/22/2021, 11:21 AM

## 2021-11-22 NOTE — Anesthesia Procedure Notes (Signed)
Procedure Name: Intubation Date/Time: 11/22/2021 12:36 PM  Performed by: Renford Dills, CRNAPre-anesthesia Checklist: Patient identified, Patient being monitored, Timeout performed, Emergency Drugs available and Suction available Patient Re-evaluated:Patient Re-evaluated prior to induction Oxygen Delivery Method: Circle System Utilized Preoxygenation: Pre-oxygenation with 100% oxygen Induction Type: IV induction, Rapid sequence and Cricoid Pressure applied Laryngoscope Size: 3 and Glidescope Grade View: Grade I Tube type: Oral Tube size: 7.0 mm Number of attempts: 1 Airway Equipment and Method: stylet Placement Confirmation: ETT inserted through vocal cords under direct vision, positive ETCO2 and breath sounds checked- equal and bilateral Secured at: 21 cm Tube secured with: Tape Dental Injury: Teeth and Oropharynx as per pre-operative assessment

## 2021-11-22 NOTE — Anesthesia Postprocedure Evaluation (Signed)
Anesthesia Post Note  Patient: Kwynn A Digirolamo  Procedure(s) Performed: AN AD HOC LABOR EPIDURAL     Patient location during evaluation: Mother Baby Anesthesia Type: Epidural Level of consciousness: awake and alert and oriented Pain management: satisfactory to patient Vital Signs Assessment: post-procedure vital signs reviewed and stable Respiratory status: respiratory function stable Cardiovascular status: stable Postop Assessment: no headache, no backache, epidural receding, patient able to bend at knees, no signs of nausea or vomiting, adequate PO intake and able to ambulate Anesthetic complications: no   No notable events documented.  Last Vitals:  Vitals:   11/22/21 0510 11/22/21 0631  BP: (!) 149/84 127/71  Pulse: (!) 54 (!) 53  Resp: 18   Temp: 36.8 C   SpO2: 100%     Last Pain:  Vitals:   11/22/21 0510  TempSrc: Oral  PainSc: 0-No pain   Pain Goal:                   Irmalee Riemenschneider

## 2021-11-22 NOTE — Op Note (Signed)
OPERATIVE NOTE  Morgan Galloway  DOB:    1990/09/21  MRN:    [id number]  CSN:    [id number]  Date of Surgery:  11/22/2021   Preoperative Diagnosis: Desired Sterilization  Postoperative Diagnosis: Same as above  Procedure:  Post partum tubal ligation via bilateral salpingectomy  Surgeon: Delila Spence. Mora Appl, M.D.  Assistant: None  Anesthesia: Local, General ETA  Fluids: 800 mL EBL: < 5 mL Complications: None  Indication:  Morgan Galloway is a 31 y.o. year old female,who desires permanent sterilization. Risks, benefits, alternatives and indication was explained to patient prior to the procedure.   Findings:  Level of epidural was determined inadequate for surgery so the patient was given general anesthesia and intubated.  The patient was prepped and draped in the usual sterile fashion.    5The Subumbilical area was tented up between two Allis clamps and area infiltrated with 0.25% Marcaine. A horizontal subumbilical incision was performed along a previous scar using a 15 blade scalpel.  The fascia was entered sharply using Mayo scissors and the peritoneum identified and easily entered. The fundus of the uterus was easily located with the surgeon's fingers through the incision and the left fallopian tube was swept into view so that the midportion of the tube could be grasped with Babcock clamps and delivered. As the loop was held up, the fallopian tube was traced down to the fimbriated end to ensure that this was the fallopian tube. From the fimbriated end of the tube the mesosalpinx was sequentially clamped, cauterized and transected with the Ligasure Impact. The ligated tubal stump was noted to be hemostatic. The process was repeated on the opposite side, grasping the midportion of the right fallopian tube, visualizing the fimbriated end and sequentially clamping, cauterizing and cutting the tube off the uterus with the Ligasure Impact.  The tubal segments were handed off to be sent to  Pathology. The pedicles were drizzled with Marcaine. The fascia was reapproximated with the tagged 0 vicryl. The skin was reapproximated with subcuticular stitch of 4-0 monocryl. The patient's incision was cleaned and dermabond and honeycomb op site was placed.      The patient tolerated the procedure well and was sent to the PACU in good condition. All instrument, sponge and needle counts were correct x 2    [name] STACIA

## 2021-11-23 MED ORDER — LABETALOL HCL 5 MG/ML IV SOLN: 80.0000 mg | INTRAVENOUS | Status: DC | PRN

## 2021-11-23 MED ORDER — HYDRALAZINE HCL 20 MG/ML IJ SOLN: 10.0000 mg | INTRAMUSCULAR | Status: DC | PRN

## 2021-11-23 MED ORDER — LABETALOL HCL 5 MG/ML IV SOLN: 40.0000 mg | INTRAVENOUS | Status: DC | PRN

## 2021-11-23 MED ORDER — MAGNESIUM SULFATE BOLUS VIA INFUSION
4.0000 g | Freq: Once | INTRAVENOUS | Status: AC
  Administered 2021-11-23: 4 g via INTRAVENOUS
  Filled 2021-11-23: qty 1000

## 2021-11-23 MED ORDER — LACTATED RINGERS IV SOLN: INTRAVENOUS | Status: DC

## 2021-11-23 MED ORDER — LABETALOL HCL 5 MG/ML IV SOLN: 20.0000 mg | INTRAVENOUS | Status: DC | PRN

## 2021-11-23 MED ORDER — LABETALOL HCL 200 MG PO TABS
200.0000 mg | ORAL_TABLET | Freq: Two times a day (BID) | ORAL | Status: DC
Start: 2021-11-23 — End: 2021-11-24
  Administered 2021-11-23 – 2021-11-24 (×3): 200 mg via ORAL
  Filled 2021-11-23 (×3): qty 1

## 2021-11-23 MED ORDER — MAGNESIUM SULFATE 40 GM/1000ML IV SOLN
2.0000 g/h | INTRAVENOUS | Status: AC
  Filled 2021-11-23: qty 1000

## 2021-11-23 NOTE — Anesthesia Postprocedure Evaluation (Signed)
Anesthesia Post Note  Patient: Morgan Galloway  Procedure(s) Performed: POST PARTUM TUBAL LIGATION     Patient location during evaluation: PACU Anesthesia Type: Epidural and General Level of consciousness: sedated and patient cooperative Pain management: pain level controlled Vital Signs Assessment: post-procedure vital signs reviewed and stable Respiratory status: spontaneous breathing Cardiovascular status: stable Anesthetic complications: no Comments: Converted to GA due to inadequate epidural block   No notable events documented.  Last Vitals:  Vitals:   11/23/21 0815 11/23/21 1155  BP: 136/86 (!) 151/89  Pulse: 76 88  Resp: 16 16  Temp: 36.9 C 37.2 C  SpO2: 100% 100%    Last Pain:  Vitals:   11/23/21 1155  TempSrc: Oral  PainSc:                  Lewie Loron

## 2021-11-23 NOTE — Lactation Note (Addendum)
This note was copied from a baby's chart. Lactation Consultation Note  Patient Name: Morgan Galloway [id number] Date: 11/23/2021 Reason for consult: Follow-up assessment;Mother's request;Early term 37-38.6wks;Infant < 6lbs;Infant weight loss;Breastfeeding assistance Age:32 hours BP states infant tolerating switch to Enfamil.  At the time of LC visit, BP fed 10 ml of EBM on spoon and 15 ml of formula.  We discussed offering EBM in a bottle followed by formula. BP to supplement after each breastfeeding 20-30 ml increase as tolerated.  BP post pumping after feeding for 15 mins.   All questions answered at the end of the visit.   BP denied any pain with latch and pumping. BP to call for latch check with next feeding.   LC alerted RN, to assist with use of breast pump.  Maternal Data Has patient been taught Hand Expression?: Yes  Feeding Mother's Current Feeding Choice: Breast Milk and Formula Nipple Type: Nfant Slow Flow (purple)  LATCH Score                    Lactation Tools Discussed/Used Tools: Pump;Flanges Breast pump type: Double-Electric Breast Pump Pump Education: Setup, frequency, and cleaning;Milk Storage Reason for Pumping: increase stimulation Pumping frequency: post pumping after feeding  Interventions Interventions: Breast feeding basics reviewed;Assisted with latch;Skin to skin;Breast massage;Breast compression;Adjust position;Support pillows;Position options;Expressed milk;DEBP;Education;Pace feeding;Infant Driven Feeding Algorithm education;LPT handout/interventions  Discharge    Consult Status Consult Status: Follow-up Date: 11/24/21 Follow-up type: In-patient    Plato Alspaugh  [name] 11/23/2021, 1:18 PM

## 2021-11-23 NOTE — Progress Notes (Addendum)
MD POSTPARTUM NOTE  PPD# 2 s/p SVD POD #1 s/p postpartum tubal via bilateral salpingectomy   Subjective: Patient denies headache but reports scotomata. Denies RUQ pain.  Tolerating PO fluid and solids No nausea or vomiting Bleeding is minimal Up ad lib / ambulatory / voiding w/o difficulty   Objective:  Vitals: BP (!) 151/89 (BP Location: Left Arm)   Pulse 88   Temp 99 F (37.2 C) (Oral)   Resp 16   Ht 5\' 4"  (1.626 m)   Wt 76.7 kg   SpO2 100%   Breastfeeding Unknown   BMI 29.02 kg/m   LABS:  Recent Labs    11/21/21 0547 11/22/21 0529  WBC 8.1 9.6  HGB 13.7 12.4  PLT 133* 106*    Physical Exam: Alert and oriented X3 Abdomen: soft, non-tender, non-distended  Fundus: firm, non-tender, U-1 Lochia: minimal Extremities: 1+ edema, no calf pain or tenderness Neuro: 2+ DTRs bilaterally no clonus    Assessment/Plan: 31 year old G41P3 PPD # 2 s/p SVD with elevated blood pressures And new onset neurologic symptoms.  Will start magnesium sulfate IV for seizure prophylaxis at least 12 hours Will start PO labetalol 200 mg bid during magnesium then increase the  Procardia to 60 mg XL Protein / Creatinine ratio to be drawn.   G11P1 MD 11/23/2021 1:52 PM

## 2021-11-24 ENCOUNTER — Other Ambulatory Visit (HOSPITAL_COMMUNITY): Payer: Self-pay

## 2021-11-24 LAB — SURGICAL PATHOLOGY

## 2021-11-24 MED ORDER — IBUPROFEN 600 MG PO TABS
600.0000 mg | ORAL_TABLET | Freq: Four times a day (QID) | ORAL | 3 refills | Status: DC | PRN
  Filled 2021-11-24: qty 30, 8d supply, fill #0

## 2021-11-24 MED ORDER — NIFEDIPINE ER 60 MG PO TB24
60.0000 mg | ORAL_TABLET | Freq: Every day | ORAL | 3 refills | Status: DC
  Filled 2021-11-24: qty 30, 30d supply, fill #0

## 2021-11-24 MED ORDER — OXYCODONE HCL 5 MG PO TABS
5.0000 mg | ORAL_TABLET | ORAL | 0 refills | Status: DC | PRN
  Filled 2021-11-24: qty 20, 4d supply, fill #0

## 2021-11-24 MED ORDER — SENNOSIDES-DOCUSATE SODIUM 8.6-50 MG PO TABS
2.0000 | ORAL_TABLET | Freq: Every evening | ORAL | 3 refills | Status: DC | PRN
  Filled 2021-11-24: qty 30, 15d supply, fill #0

## 2021-11-24 NOTE — Discharge Summary (Addendum)
[facility] Discharge Summary   Patient Name:   Morgan Galloway DOB:     1991-04-23 MRN:     [id number]  Date of Admission:   11/21/2021 Date of Discharge:  11/24/2021  Admitting diagnosis:    Normal labor [O80, Z37.9] Principal Problem:   Normal labor    Discharge diagnosis:    Normal labor [O80, Z37.9] Principal Problem:   Normal labor Preeclampsia (severe)        Additional problems: None                                          Post partum procedures: postpartum tubal via bilateral salpingectomy; IV magnesium sulfate for seizure prophylaxis Augmentation: N/A Complications: None  Hospital course: Onset of Labor With Vaginal Delivery      31 y.o. yo [id number] at 49w3dwas admitted in Active Labor on 11/21/2021. Patient had an uncomplicated labor course as follows:  Membrane Rupture Time/Date: 12:22 PM ,11/21/2021   Delivery Method:Vaginal, Spontaneous  Episiotomy: None  Lacerations:  None  Patient had an uncomplicated postpartum course.  She is ambulating, tolerating a regular diet, passing flatus, and urinating well. Patient is discharged home in stable condition on 11/24/21.  Newborn Data: Birth date:11/21/2021  Birth time:1:31 PM  Gender:Female  Living status:Living  Apgars:9 ,9  Weight:2370 g   Magnesium Sulfate received: Yes: Seizure prophylaxis BMZ received: No Rhophylac:No MMR:No T-DaP:Given prenatally Flu: No Transfusion:No                                                               Type of Delivery:  NSVD Delivering Provider: SSerita GrammesD  Date of Delivery:  11/21/21  Newborn Data:  Baby Feeding:   Bottle and Breast Disposition:   home with mother  Physical Exam:   Vitals:   11/24/21 0100 11/24/21 0200 11/24/21 0308 11/24/21 0756  BP:   126/86 (!) 136/91  Pulse:   72 92  Resp: _0 Temp:   (!) 97.5 F (36.4 C) 98.2 F (36.8 C)  TempSrc:   Oral Oral  SpO2:   99%   Weight:      Height:       General: alert,  cooperative, and no distress Lochia: appropriate Uterine Fundus: firm Incision: N/A DVT Evaluation: No evidence of DVT seen on physical exam. Negative Homan's sign. No cords or calf tenderness.  Labs: Lab Results  Component Value Date   WBC 9.6 11/22/2021   HGB 12.4 11/22/2021   HCT 36.1 11/22/2021   MCV 90.0 11/22/2021   PLT 106 (L) 11/22/2021      Latest Ref Rng & Units 11/22/2021    5:09 PM  CMP  Glucose 70 - 99 mg/dL 109   BUN 6 - 20 mg/dL <5   Creatinine 0.44 - 1.00 mg/dL 0.90   Sodium 135 - 145 mmol/L 137   Potassium 3.5 - 5.1 mmol/L 3.8   Chloride 98 - 111 mmol/L 104   CO2 22 - 32 mmol/L 24   Calcium 8.9 - 10.3 mg/dL 9.1   Total Protein 6.5 - 8.1 g/dL 6.9   Total Bilirubin 0.3 - 1.2 mg/dL 0.5  Alkaline Phos 38 - 126 U/L 127   AST 15 - 41 U/L 32   ALT 0 - 44 U/L 13     Discharge instruction: per After Visit Summary and "Baby and Me Booklet".  After Visit Meds:  Allergies as of 11/24/2021   No Known Allergies      Medication List     STOP taking these medications    cephALEXin 500 MG capsule Commonly known as: KEFLEX   valACYclovir 500 MG tablet Commonly known as: VALTREX       TAKE these medications    acetaminophen 500 MG tablet Commonly known as: TYLENOL Take 1,000 mg by mouth every 8 (eight) hours as needed for mild pain.   calcium carbonate 500 MG chewable tablet Commonly known as: TUMS - dosed in mg elemental calcium Chew 500 mg by mouth daily as needed for indigestion or heartburn.   cyclobenzaprine 10 MG tablet Commonly known as: FLEXERIL Take 10 mg by mouth at bedtime.   ibuprofen 600 MG tablet Commonly known as: ADVIL Take 1 tablet (600 mg total) by mouth every 6 (six) hours as needed for mild pain, moderate pain or cramping.   NIFEdipine 60 MG 24 hr tablet Commonly known as: ADALAT CC Take 1 tablet (60 mg total) by mouth daily.   ondansetron 4 MG disintegrating tablet Commonly known as: ZOFRAN-ODT Take 4 mg by mouth every  8 (eight) hours as needed for nausea or vomiting.   oxyCODONE 5 MG immediate release tablet Commonly known as: Oxy IR/ROXICODONE Take 1 tablet (5 mg total) by mouth every 4 (four) hours as needed (pain scale 4-7).   promethazine 25 MG tablet Commonly known as: PHENERGAN Take 1 tablet (25 mg total) by mouth every 6 (six) hours as needed for nausea or vomiting.   senna-docusate 8.6-50 MG tablet Commonly known as: Senokot-S Take 2 tablets by mouth at bedtime as needed for mild constipation.   Taron-C DHA 35-1 MG Caps Take 1 capsule by mouth daily.        Diet: low salt diet  Activity: Advance as tolerated. Pelvic rest for 6 weeks.   Outpatient follow up:1 week Follow up Appt:No future appointments. Follow up visit: No follow-ups on file.  Postpartum contraception: Tubal Ligation  11/24/2021 Sanjuana Kava, MD

## 2021-11-24 NOTE — Lactation Note (Signed)
This note was copied from a baby's chart. Lactation Consultation Note  Patient Name: Morgan Galloway FEXMD'Y Date: 11/24/2021   Age:32 hours  Phone call received stating mother's milk "is in" and should birth parent continue to supplement with formula.  Spoke with Ophelia Charter RN and suggest birth parent give volume pumped and then formula difference as needed.  Lactation to follow up later today.    Dahlia Byes [facility] 11/24/2021, 8:46 AM

## 2021-11-29 ENCOUNTER — Telehealth (HOSPITAL_COMMUNITY): Payer: Self-pay | Admitting: *Deleted

## 2021-11-29 NOTE — Telephone Encounter (Signed)
Patient voiced no questions or concerns regarding her health at this time. EPDS=2. Patient voiced no questions or concerns regarding infant at this time. Patient reports infant sleeps in a "baby bed" on her back. RN reviewed ABCs of safe sleep. Patient verbalized understanding. Patient informed about hospital's virtual postpartum classes and support groups. Declined email information at this time. Deforest Hoyles, RN, 11/29/21, [phone]

## 2021-12-03 ENCOUNTER — Telehealth: Payer: Medicaid Other | Admitting: Nurse Practitioner

## 2021-12-03 DIAGNOSIS — A6000 Herpesviral infection of urogenital system, unspecified: Secondary | ICD-10-CM

## 2021-12-03 MED ORDER — VALACYCLOVIR HCL 500 MG PO TABS: 500.0000 mg | ORAL_TABLET | Freq: Two times a day (BID) | ORAL | 1 refills | Status: AC

## 2021-12-03 NOTE — Progress Notes (Signed)
E-Visit for Herpes Simplex  We are sorry that you are not feeling well.  Here is how we plan to help!  Based on what you have shared ith me, it looks like you may be having an outbreak/flare-up of genital herpes.    I have prescribed I have prescribed Valacyclovir 500 mg Take one by mouth twice a day for 3 days.    If you have been prescribed long term medications to be taken on a regular basis, it is important to follow the recommendations and take them as ordered.    Outbreaks usually include blisters and open sores in the genital area. Outbreaks that happen after the first time are usually not as severe and do not last as long. Genital Herpes Simplex is a commonly sexually transmitted viral infection that is found worldwide. Most of these genital infections are caused by one or two herpes simplex viruses that is passed from person to person during vaginal, oral, or anal sex. Sometimes, people do not know they have herpes because they do not have any symptoms.  Please be aware that if you have genital herpes you can be contagious even when you are not having rash or flare-up and you may not have any symptoms, even when you are taking suppressive medicines.  Herpes cannot be cured. The disease usually causes most problems during the first few years. After that, the virus is still there, but it causes few to no symptoms. Even when the virus is active, people with herpes can take medicines to reduce and help prevent symptoms.  Herpes is an infection that can cause blisters and open sores on the genital area. Herpes is caused by a virus that is passed from person to person during vaginal, oral, or anal sex. Sometimes, people do not know they have herpes because they do not have any symptoms. Herpes cannot be cured. The disease usually causes most problems during the first few years. After that, the virus is still there, but it causes few to no symptoms. Even when the virus is active, people with herpes  can take medicines to reduce and help prevent symptoms.  If you have been prescribed medications to be taken on a regular basis, it is important to follow the recommendations and take them as ordered.  Some people with herpes never have any symptoms. But other people can develop symptoms within a few weeks of being infected with the herpes virus   Symptoms usually include blisters in the genital area. In women, this area includes the vagina, buttocks, anus, or thighs. In men, this area includes the penis, scrotum, anus, butt, or thighs. The blisters can become painful open sores, which then crust over as they heal. Sometimes, people can have other symptoms that include:  ?Blisters on the mouth or lips ?Fever, headache, or pain in the joints ?Trouble urinating  Outbreaks might occur every month or more often, or just once or twice a year. Sometimes, people can tell when an outbreak will occur, because they feel itching or pain beforehand. Sometimes they do not know that an outbreak is coming because they have no symptoms. Whatever your pattern is, keep in mind that herpes outbreaks usually become less frequent over time as you get older. Certain things, called "triggers," can make outbreaks more likely to occur. These include stress, sunlight, menstrual periods,or getting sick.  Antiviral therapy can shorten the duration of symptoms and signs in primary infection, which, when untreated, can be associated with significant increase in the   symptoms of the disease.  HOME CARE Use a portable bath (such as a "Sitz bath") where you can sit in warm water for about 20 minutes. Your bathtub could also work. Avoid bubble baths.  Keep the genital area clean and dry and avoid tight clothes.  Take over-the-counter pain medicine such as acetaminophen (brand name: Tylenol) or ibuprofen sample brand names: Advil, Motrin). But avoid aspirin.  Only take medications as instructed by your medical team.  You are  most likely to spread herpes to a sex partner when you have blisters and open sores on your body. But it's also possible to spread herpes to your partner when you do not have any symptoms. That is because herpes can be present on your body without causing any symptoms, like blisters or pain.  Telling your sex partner that you have herpes can be hard. But it can help protect them, since there are ways to lower the risk of spreading the infection.   Using a condom every time you have sex  Not having sex when you have symptoms  Not having oral sex if you have blisters or open sores (in the genital area or around your mouth)  MAKE SURE YOU   Understand these instructions. Do not have sex without using a condom until you have been seen by a doctor and as instructed by the provider If you are not better or improved within 7 days, you MUST have a follow up at your doctor or the health department for evaluation. There are other causes of rashes in the genital region.  Thank you for choosing an e-visit.  Your e-visit answers were reviewed by a board certified advanced clinical practitioner to complete your personal care plan. Depending upon the condition, your plan could have included both over the counter or prescription medications.  Please review your pharmacy choice. Make sure the pharmacy is open so you can pick up prescription now. If there is a problem, you may contact your provider through MyChart messaging and have the prescription routed to another pharmacy.  Your safety is important to us. If you have drug allergies check your prescription carefully.   For the next 24 hours you can use MyChart to ask questions about today's visit, request a non-urgent call back, or ask for a work or school excuse. You will get an email in the next two days asking about your experience. I hope that your e-visit has been valuable and will speed your recovery.      

## 2021-12-03 NOTE — Progress Notes (Signed)
I have spent 5 minutes in review of e-visit questionnaire, review and updating patient chart, medical decision making and response to patient.  ° °Shanara Schnieders W Geoffrey Mankin, NP ° °  °

## 2022-04-27 IMAGING — CT CT RENAL STONE PROTOCOL
2 of 4 series · 17 of 46 positions shown, 19 images · non-contrast
Comparison: None.

CLINICAL DATA: Pain

EXAM:
CT ABDOMEN AND PELVIS WITHOUT CONTRAST
TECHNIQUE: Multidetector CT imaging of the abdomen and pelvis was performed
following the standard protocol without IV contrast.

[Series 3: ap without · axial · non-contrast · 0.70mm/px · z∈[-525,-135]mm · 14 of 86 slices shown, 16 images]
[im 4/86  soft-tissue]
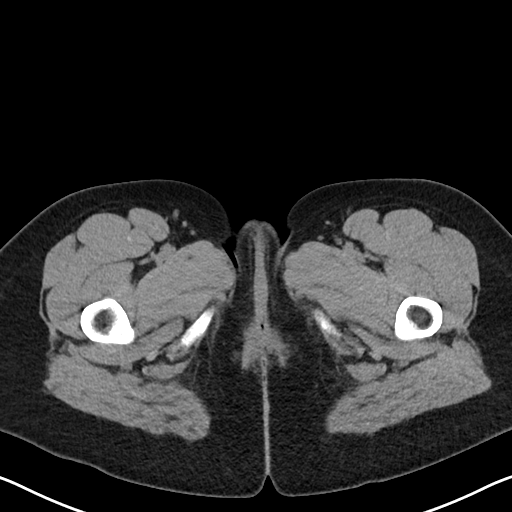
[im 4/86  bone]
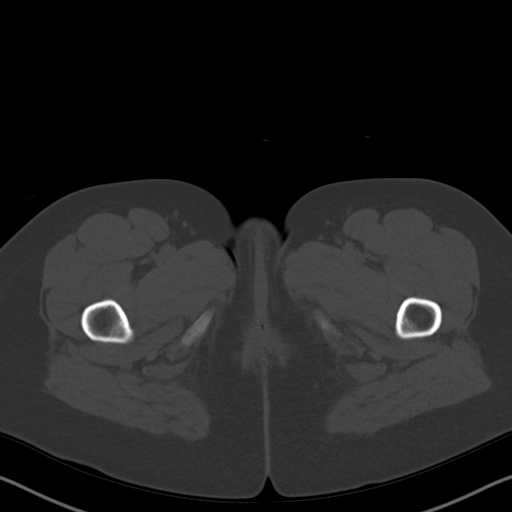
[im 12/86  soft-tissue]
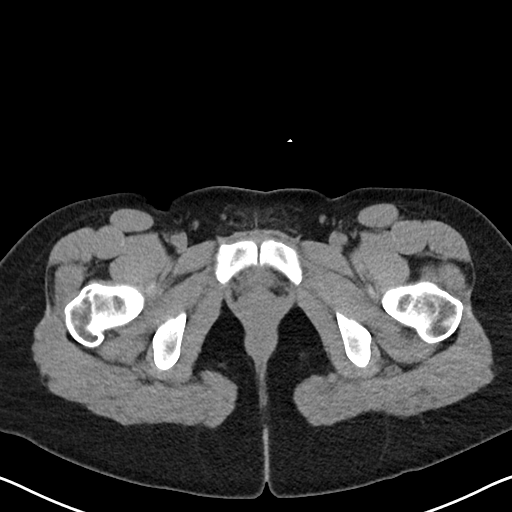
[im 16/86  soft-tissue]
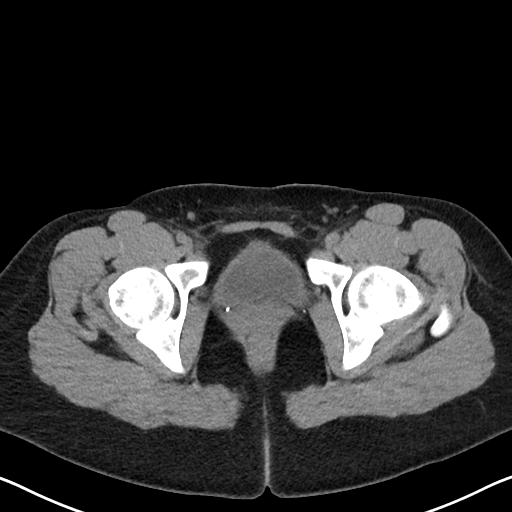
[im 24/86  soft-tissue]
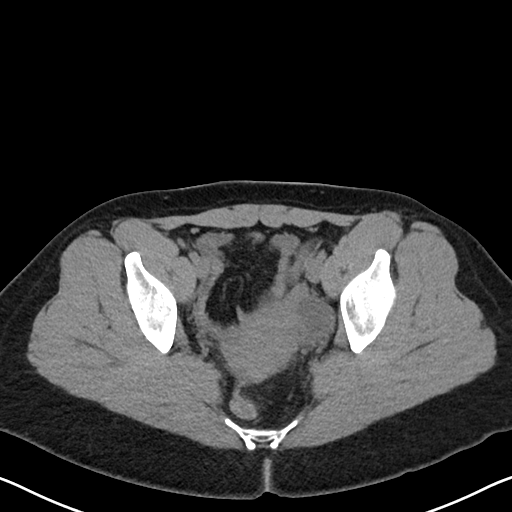
[im 28/86  soft-tissue]
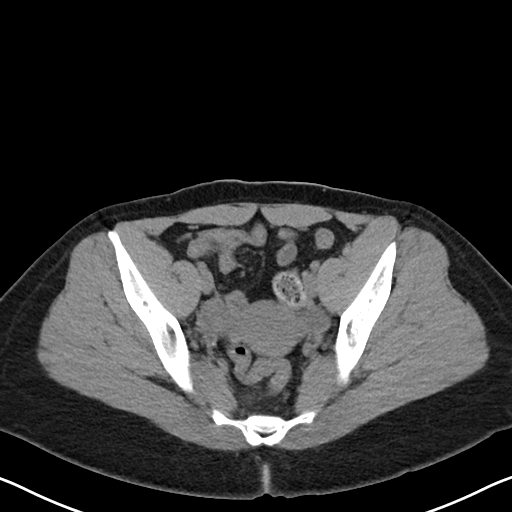
[im 35/86  soft-tissue]
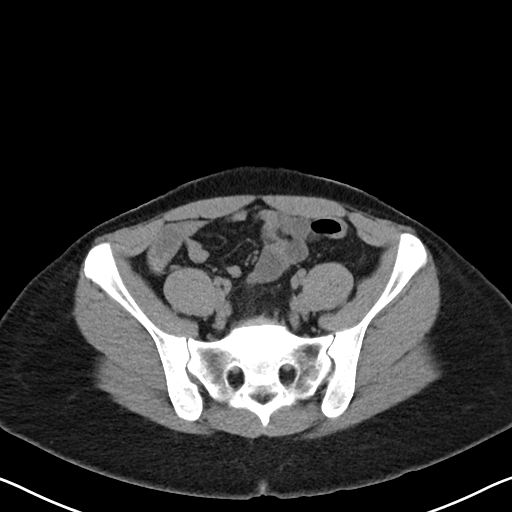
[im 39/86  soft-tissue]
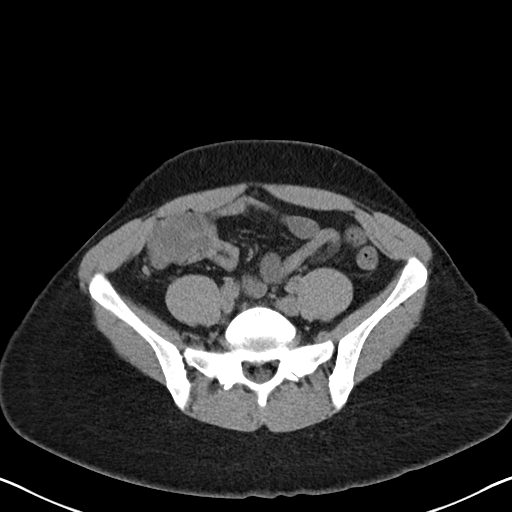
[im 47/86  soft-tissue]
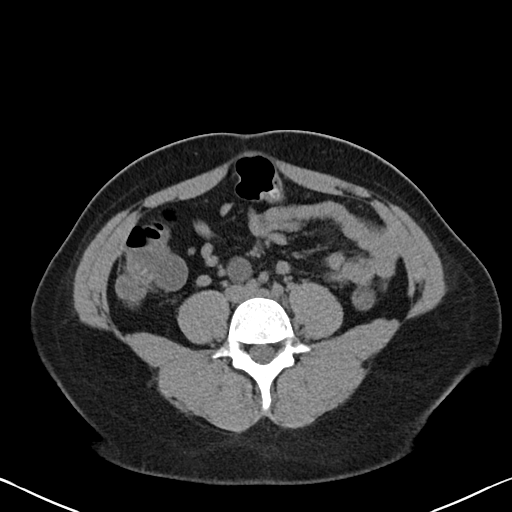
[im 51/86  soft-tissue]
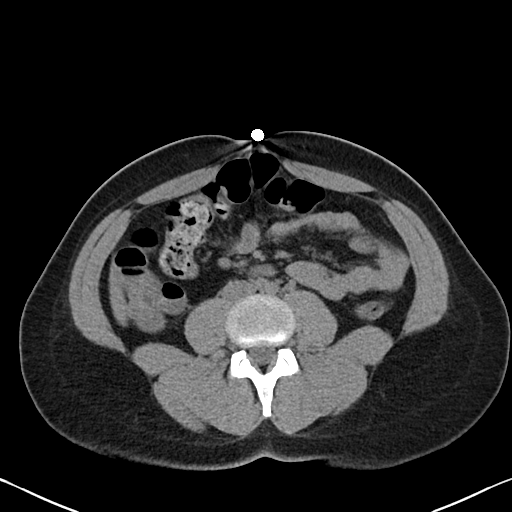
[im 51/86  bone]
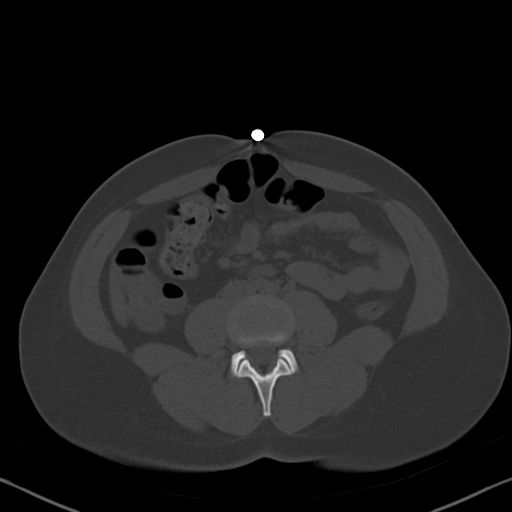
[im 58/86  soft-tissue]
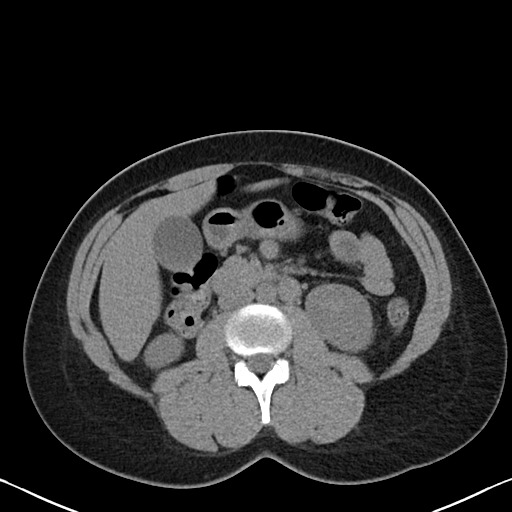
[im 62/86  soft-tissue]
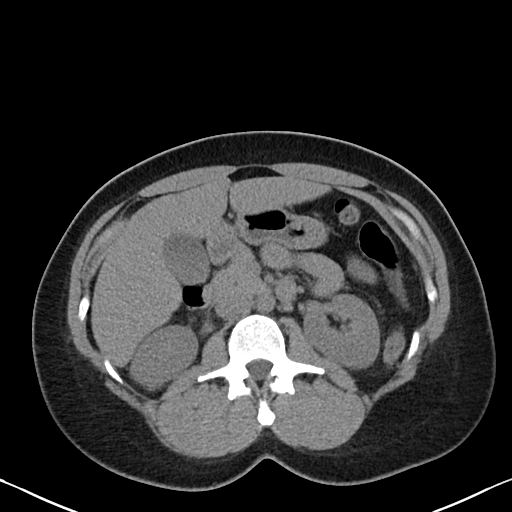
[im 70/86  soft-tissue]
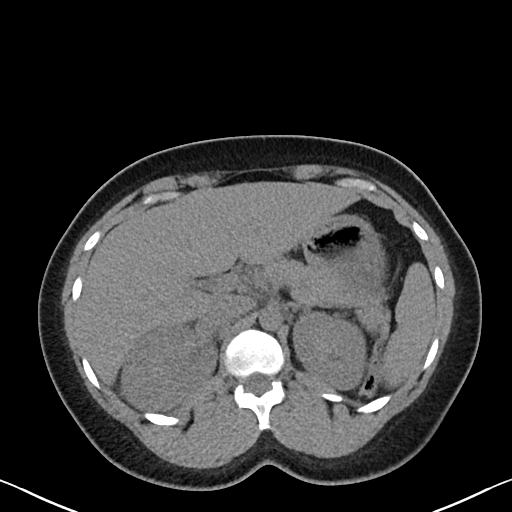
[im 74/86  soft-tissue]
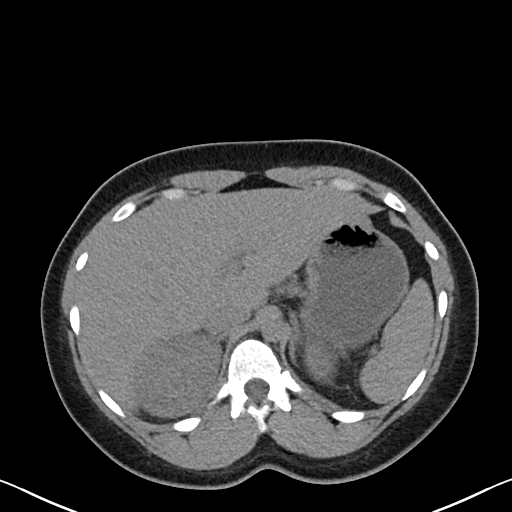
[im 82/86  soft-tissue]
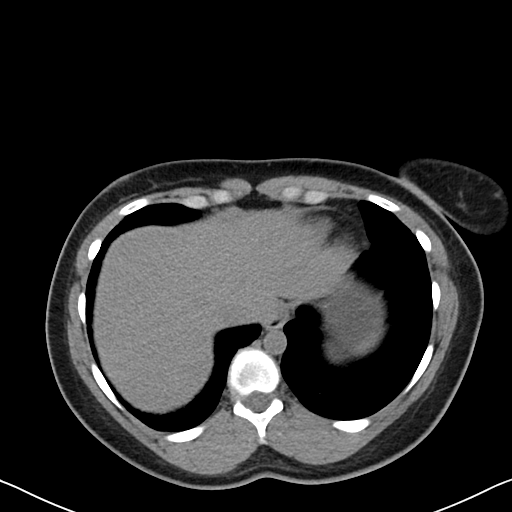

[Series 6: cor · coronal · 0.84mm/px · 3 of 86 slices shown]
[im 29/86  soft-tissue]
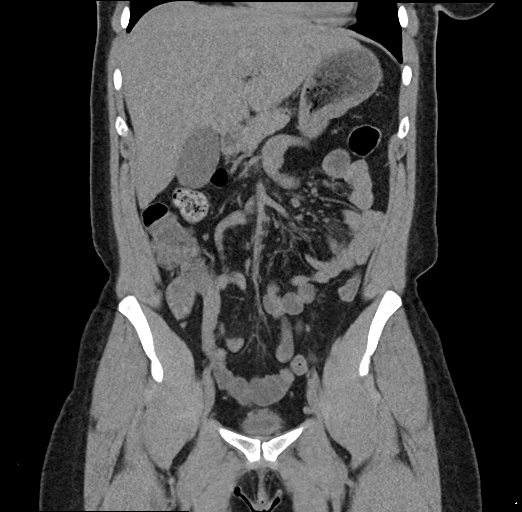
[im 38/86  soft-tissue]
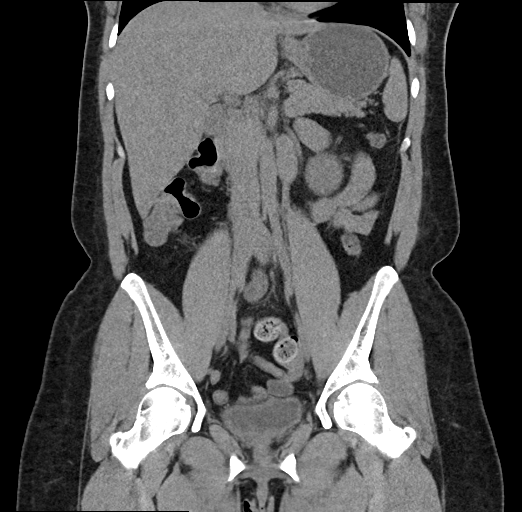
[im 48/86  soft-tissue]
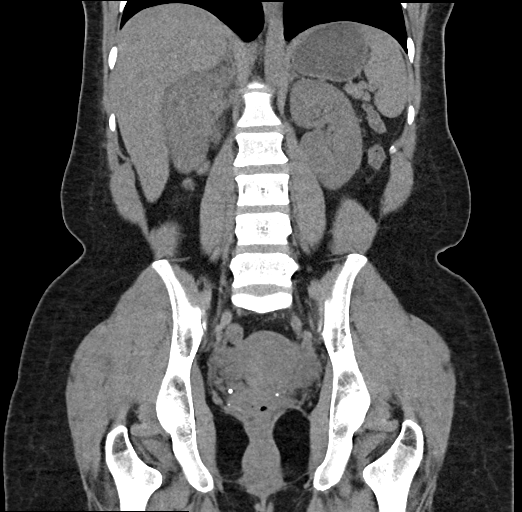

[17 of 46 positions shown; findings below may reference images not displayed]

FINDINGS: Lower chest: The lung bases are clear. The heart size is normal.

Hepatobiliary: The liver is normal. Normal gallbladder.There is no
biliary ductal dilation.

Pancreas: Normal contours without ductal dilatation. No
peripancreatic fluid collection.

Spleen: Unremarkable.

Adrenals/Urinary Tract:

--Adrenal glands: Unremarkable.

--Right kidney/ureter: There is a punctate nonobstructing stone in
the right kidney. There is no hydronephrosis. The right kidney
appears to be enlarged with adjacent fat stranding.

--Left kidney/ureter: No hydronephrosis or radiopaque kidney stones.

--Urinary bladder: The urinary bladder is underdistended.

Stomach/Bowel:

--Stomach/Duodenum: No hiatal hernia or other gastric abnormality.
Normal duodenal course and caliber.

--Small bowel: Unremarkable.

--Colon: Unremarkable.

--Appendix: Normal.

Vascular/Lymphatic: Normal course and caliber of the major abdominal
vessels.

--No retroperitoneal lymphadenopathy.

--No mesenteric lymphadenopathy.

--No pelvic or inguinal lymphadenopathy.

Reproductive: Unremarkable

Other: No ascites or free air. The abdominal wall is normal.

Musculoskeletal. No acute displaced fractures.
IMPRESSION: 1. The right kidney appears to be enlarged with adjacent fat
stranding. This could be secondary to pyelonephritis or recently
passed stone. There is no hydronephrosis. Correlation with
urinalysis is recommended.
2. Punctate nonobstructing stone in the right kidney.
3. Normal appendix.

## 2023-02-20 ENCOUNTER — Ambulatory Visit: Payer: Medicaid Other | Admitting: Dermatology

## 2023-08-01 ENCOUNTER — Ambulatory Visit: Payer: Medicaid Other | Admitting: Dermatology

## 2023-11-08 ENCOUNTER — Emergency Department (HOSPITAL_BASED_OUTPATIENT_CLINIC_OR_DEPARTMENT_OTHER)
Admission: EM | Admit: 2023-11-08 | Discharge: 2023-11-09 | Disposition: A | Discharge status: Home / Self Care | Attending: Emergency Medicine | Admitting: Emergency Medicine

## 2023-11-08 ENCOUNTER — Other Ambulatory Visit: Payer: Self-pay

## 2023-11-08 ENCOUNTER — Emergency Department (HOSPITAL_BASED_OUTPATIENT_CLINIC_OR_DEPARTMENT_OTHER): Admitting: Radiology

## 2023-11-08 DIAGNOSIS — R509 Fever, unspecified: Secondary | ICD-10-CM | POA: Diagnosis not present

## 2023-11-08 DIAGNOSIS — M545 Low back pain, unspecified: Secondary | ICD-10-CM | POA: Diagnosis present

## 2023-11-08 DIAGNOSIS — R109 Unspecified abdominal pain: Secondary | ICD-10-CM

## 2023-11-08 LAB — CBC WITH DIFFERENTIAL/PLATELET
Abs Immature Granulocytes: 0.03 K/uL (ref 0.00–0.07)
Basophils Absolute: 0 K/uL (ref 0.0–0.1)
Basophils Relative: 0 %
Eosinophils Absolute: 0.1 K/uL (ref 0.0–0.5)
Eosinophils Relative: 1 %
HCT: 38.5 % (ref 36.0–46.0)
Hemoglobin: 13.3 g/dL (ref 12.0–15.0)
Immature Granulocytes: 0 %
Lymphocytes Relative: 18 %
Lymphs Abs: 2.1 K/uL (ref 0.7–4.0)
MCH: 30.6 pg (ref 26.0–34.0)
MCHC: 34.5 g/dL (ref 30.0–36.0)
MCV: 88.5 fL (ref 80.0–100.0)
Monocytes Absolute: 1.2 K/uL — ABNORMAL HIGH (ref 0.1–1.0)
Monocytes Relative: 10 %
Neutro Abs: 8.6 K/uL — ABNORMAL HIGH (ref 1.7–7.7)
Neutrophils Relative %: 71 %
Platelets: 158 K/uL (ref 150–400)
RBC: 4.35 MIL/uL (ref 3.87–5.11)
RDW: 11.9 % (ref 11.5–15.5)
WBC: 11.9 K/uL — ABNORMAL HIGH (ref 4.0–10.5)
nRBC: 0 % (ref 0.0–0.2)

## 2023-11-08 LAB — COMPREHENSIVE METABOLIC PANEL WITH GFR
ALT: 8 U/L (ref 0–44)
AST: 16 U/L (ref 15–41)
Albumin: 4.6 g/dL (ref 3.5–5.0)
Alkaline Phosphatase: 67 U/L (ref 38–126)
Anion gap: 13 (ref 5–15)
BUN: 9 mg/dL (ref 6–20)
CO2: 22 mmol/L (ref 22–32)
Calcium: 9.5 mg/dL (ref 8.9–10.3)
Chloride: 101 mmol/L (ref 98–111)
Creatinine, Ser: 0.91 mg/dL (ref 0.44–1.00)
GFR, Estimated: >60 mL/min (ref >60)
Glucose, Bld: 103 mg/dL — ABNORMAL HIGH (ref 70–99)
Potassium: 3.6 mmol/L (ref 3.5–5.1)
Sodium: 135 mmol/L (ref 135–145)
Total Bilirubin: 0.8 mg/dL (ref 0.0–1.2)
Total Protein: 7.7 g/dL (ref 6.5–8.1)

## 2023-11-08 LAB — URINALYSIS, W/ REFLEX TO CULTURE (INFECTION SUSPECTED)
Bacteria, UA: NONE SEEN
Bilirubin Urine: NEGATIVE
Glucose, UA: NEGATIVE mg/dL
Hgb urine dipstick: NEGATIVE
Ketones, ur: NEGATIVE mg/dL
Leukocytes,Ua: NEGATIVE
Nitrite: NEGATIVE
Protein, ur: NEGATIVE mg/dL
Specific Gravity, Urine: <1.005 — ABNORMAL LOW (ref 1.005–1.030)
pH: 7 (ref 5.0–8.0)

## 2023-11-08 LAB — LACTIC ACID, PLASMA: Lactic Acid, Venous: 0.9 mmol/L (ref 0.5–1.9)

## 2023-11-08 LAB — PREGNANCY, URINE: Preg Test, Ur: NEGATIVE

## 2023-11-08 MED ORDER — SODIUM CHLORIDE 0.9 % IV BOLUS
1000.0000 mL | Freq: Once | INTRAVENOUS | Status: AC
  Administered 2023-11-08: 1000 mL via INTRAVENOUS

## 2023-11-08 MED ORDER — KETOROLAC TROMETHAMINE 15 MG/ML IJ SOLN
15.0000 mg | Freq: Once | INTRAMUSCULAR | Status: AC
  Administered 2023-11-08: 15 mg via INTRAVENOUS
  Filled 2023-11-08: qty 1

## 2023-11-08 NOTE — ED Triage Notes (Signed)
 Pt to ED reporting right flank pain and generalized body aches starting today. Temp of 100 at home. Tylenol  taken at home at 20:00.

## 2023-11-08 NOTE — ED Provider Notes (Signed)
 [place] EMERGENCY DEPARTMENT AT [facility] Provider Note   CSN: [id number] Arrival date & time: 11/08/23  2110     Patient presents with: Flank Pain   Morgan Galloway is a 33 y.o. female.   Patient is a 33 year old female who presents with back pain and fever.  She states that started earlier today with pain in her right mid back area.  She denies any associate abdominal pain.  She has had some other body aches and temperatures up to 100 at home.  She has had a little bit of cough which is mostly dry.  No shortness of breath.  No urinary symptoms.  Although she has had similar symptoms in the past with a UTI.  She denies any vomiting or diarrhea.  No sore throat.  No vaginal bleeding or discharge.       Prior to Admission medications   Medication Sig Start Date End Date Taking? Authorizing Provider  acetaminophen  (TYLENOL ) 500 MG tablet Take 1,000 mg by mouth every 8 (eight) hours as needed for mild pain.    [provider]  calcium carbonate (TUMS - DOSED IN MG ELEMENTAL CALCIUM) 500 MG chewable tablet Chew 500 mg by mouth daily as needed for indigestion or heartburn.    [provider]  cyclobenzaprine  (FLEXERIL ) 10 MG tablet Take 10 mg by mouth at bedtime.    [provider]  ibuprofen  (ADVIL ) 600 MG tablet Take 1 tablet (600 mg total) by mouth every 6 (six) hours as needed for mild pain, moderate pain or cramping. 11/24/21   Pinn, Walda, MD  NIFEdipine  (ADALAT  CC) 60 MG 24 hr tablet Take 1 tablet (60 mg total) by mouth daily. 11/24/21   Pinn, Walda, MD  ondansetron  (ZOFRAN -ODT) 4 MG disintegrating tablet Take 4 mg by mouth every 8 (eight) hours as needed for nausea or vomiting. 04/14/21   [provider]  oxyCODONE  (OXY IR/ROXICODONE ) 5 MG immediate release tablet Take 1 tablet (5 mg total) by mouth every 4 (four) hours as needed (pain scale 4-7). 11/24/21   Pinn, Walda, MD  Prenat-FeFum-FePo-FA-Omega 3 (TARON-C DHA) 35-1 MG CAPS Take 1  capsule by mouth daily. 11/18/21   [provider]  promethazine  (PHENERGAN ) 25 MG tablet Take 1 tablet (25 mg total) by mouth every 6 (six) hours as needed for nausea or vomiting. 05/10/21   Jerilynn Longs, NP  senna-docusate (SENOKOT-S) 8.6-50 MG tablet Take 2 tablets by mouth at bedtime as needed for mild constipation. 11/24/21   Pinn, Walda, MD    Allergies: Lamotrigine    Review of Systems  Constitutional:  Positive for fatigue and fever. Negative for chills and diaphoresis.  HENT:  Negative for congestion, rhinorrhea and sneezing.   Eyes: Negative.   Respiratory:  Negative for cough, chest tightness and shortness of breath.   Cardiovascular:  Negative for chest pain and leg swelling.  Gastrointestinal:  Negative for abdominal pain, blood in stool, diarrhea, nausea and vomiting.  Genitourinary:  Negative for difficulty urinating, flank pain, frequency and hematuria.  Musculoskeletal:  Positive for back pain and myalgias. Negative for arthralgias.  Skin:  Negative for rash.  Neurological:  Negative for dizziness, speech difficulty, weakness, numbness and headaches.    Updated Vital Signs BP (!) 139/91 (BP Location: Right Arm)   Pulse (!) 119   Temp 99.4 F (37.4 C) (Oral)   Resp 18   LMP 10/19/2023   SpO2 98%   Physical Exam Constitutional:      Appearance: She is  well-developed.  HENT:     Head: Normocephalic and atraumatic.     Mouth/Throat:     Mouth: Mucous membranes are moist.     Pharynx: No oropharyngeal exudate or posterior oropharyngeal erythema.  Eyes:     Pupils: Pupils are equal, round, and reactive to light.  Cardiovascular:     Rate and Rhythm: Normal rate and regular rhythm.     Heart sounds: Normal heart sounds.  Pulmonary:     Effort: Pulmonary effort is normal. No respiratory distress.     Breath sounds: Normal breath sounds. No wheezing or rales.  Chest:     Chest wall: No tenderness.  Abdominal:     General: Bowel sounds are normal.      Palpations: Abdomen is soft.     Tenderness: There is no abdominal tenderness. There is no guarding or rebound.  Musculoskeletal:        General: Normal range of motion.     Cervical back: Normal range of motion and neck supple.  Lymphadenopathy:     Cervical: No cervical adenopathy.  Skin:    General: Skin is warm and dry.     Findings: No rash.  Neurological:     Mental Status: She is alert and oriented to person, place, and time.     (all labs ordered are listed, but only abnormal results are displayed) Labs Reviewed  COMPREHENSIVE METABOLIC PANEL WITH GFR - Abnormal; Notable for the following components:      Result Value   Glucose, Bld 103 (*)    All other components within normal limits  CBC WITH DIFFERENTIAL/PLATELET - Abnormal; Notable for the following components:   WBC 11.9 (*)    Neutro Abs 8.6 (*)    Monocytes Absolute 1.2 (*)    All other components within normal limits  URINALYSIS, W/ REFLEX TO CULTURE (INFECTION SUSPECTED) - Abnormal; Notable for the following components:   Color, Urine COLORLESS (*)    Specific Gravity, Urine <1.005 (*)    All other components within normal limits  RESP PANEL BY RT-PCR (RSV, FLU A&B, COVID)  RVPGX2  LACTIC ACID, PLASMA  PREGNANCY, URINE  LACTIC ACID, PLASMA    EKG: None  Radiology: No results found.   Procedures   Medications Ordered in the ED  sodium chloride  0.9 % bolus 1,000 mL (has no administration in time range)  ketorolac  (TORADOL ) 15 MG/ML injection 15 mg (has no administration in time range)                                    Medical Decision Making Amount and/or Complexity of Data Reviewed Labs: ordered. Radiology: ordered.  Risk Prescription drug management.   This patient presents to the ED for concern of back pain, fever, this involves an extensive number of treatment options, and is a complaint that carries with it a high risk of complications and morbidity.  I considered the following  differential and admission for this acute, potentially life threatening condition.  The differential diagnosis includes kidney stone, UTI, pyelonephritis, URI, influenza, viral infection, pneumonia   MDM:    Patient is a 33 year old who presents with right sided back pain as well as low-grade fever and other body aches.  She has a little bit of a cough.  She does not have any associated abdominal pain.  No vomiting.  She is nontender on her abdominal exam.  Her urine is not  consistent with infection.  Labs show mild elevation in her WBC count.  Will check a chest x-ray and a respiratory viral panel.  She was tachycardic on arrival, will give IV fluids and reassess, care turned over to Morgan Galloway pending reassessment after labs and chest x-ray.  (Labs, imaging, consults)  Labs: I Ordered, and personally interpreted labs.  The pertinent results include: Elevated WBC count, normal urine  Imaging Studies ordered: I ordered imaging studies including chest x-ray   Additional history obtained from chart.  External records from outside source obtained and reviewed including prior records  Cardiac Monitoring: The patient was maintained on a cardiac monitor.  If on the cardiac monitor, I personally viewed and interpreted the cardiac monitored which showed an underlying rhythm of: Sinus tachycardia  Reevaluation: After the interventions noted above, I reevaluated the patient and found that they have :improved  Social Determinants of Health:    Disposition: Pending  Co morbidities that complicate the patient evaluation  Past Medical History:  Diagnosis Date   Anxiety    Bipolar 1 disorder, manic, mild (HCC)    Chronic kidney disease    Depression    Zoloft in past; stopped due to potential of pregnancy.   Gonorrhea    Headache    Vaginal Pap smear, abnormal    f/u ok     Medicines Meds ordered this encounter  Medications   sodium chloride  0.9 % bolus 1,000 mL   ketorolac  (TORADOL )  15 MG/ML injection 15 mg    I have reviewed the patients home medicines and have made adjustments as needed  Problem List / ED Course: Problem List Items Addressed This Visit   None            Final diagnoses:  None    ED Discharge Orders     None          Lenor Hollering, MD 11/08/23 2307

## 2023-11-09 ENCOUNTER — Encounter (HOSPITAL_BASED_OUTPATIENT_CLINIC_OR_DEPARTMENT_OTHER): Payer: Self-pay

## 2023-11-09 LAB — RESP PANEL BY RT-PCR (RSV, FLU A&B, COVID)  RVPGX2
Influenza A by PCR: NEGATIVE
Influenza B by PCR: NEGATIVE
Resp Syncytial Virus by PCR: NEGATIVE
SARS Coronavirus 2 by RT PCR: NEGATIVE

## 2023-11-09 LAB — LACTIC ACID, PLASMA: Lactic Acid, Venous: 0.9 mmol/L (ref 0.5–1.9)

## 2023-11-09 NOTE — ED Provider Notes (Signed)
 Patient signed out pending fluids and viral testing.  Patient in brief presented with leg pain and urinary symptoms.  Her work up has been reassuring including a urinalysis that does not indicate a UTI.  Physical exam was benign as documented by prior provider.  Lactate normal.  Chest x-ray and viral panel are negative.  Unclear etiology of the patient's symptoms but significant improvement after fluids.  Heart rate improved into the 70s.  On recheck, she states she feels better and is ready to go home.  Recommend ibuprofen  for pain and hydration.  Follow-up with primary physician. Physical Exam  BP (!) 107/53   Pulse 73   Temp 98.7 F (37.1 C) (Oral)   Resp 18   LMP 10/19/2023   SpO2 97%   Physical Exam Awake, alert, no acute distress Procedures  Procedures  ED Course / MDM    Medical Decision Making Amount and/or Complexity of Data Reviewed Labs: ordered. Radiology: ordered.  Risk Prescription drug management.          Morgan Charmaine FALCON, MD 11/09/23 [phone]

## 2023-11-09 NOTE — Discharge Instructions (Addendum)
 You were seen today for flank pain and urinary symptoms.  Your workup is reassuring.  There is no indication of a urinary tract infection.  Your viral testing is negative.  Take ibuprofen  for any noted pain.  Follow-up closely with your primary physician.

## 2023-11-11 ENCOUNTER — Emergency Department (HOSPITAL_COMMUNITY)

## 2023-11-11 ENCOUNTER — Emergency Department (HOSPITAL_BASED_OUTPATIENT_CLINIC_OR_DEPARTMENT_OTHER)

## 2023-11-11 ENCOUNTER — Other Ambulatory Visit: Payer: Self-pay

## 2023-11-11 ENCOUNTER — Observation Stay (HOSPITAL_BASED_OUTPATIENT_CLINIC_OR_DEPARTMENT_OTHER)
Admission: EM | Admit: 2023-11-11 | Discharge: 2023-11-12 | Disposition: A | Discharge status: Home / Self Care | Attending: Emergency Medicine | Admitting: Emergency Medicine

## 2023-11-11 DIAGNOSIS — D72829 Elevated white blood cell count, unspecified: Secondary | ICD-10-CM | POA: Diagnosis not present

## 2023-11-11 DIAGNOSIS — F419 Anxiety disorder, unspecified: Secondary | ICD-10-CM | POA: Diagnosis not present

## 2023-11-11 DIAGNOSIS — F32A Depression, unspecified: Secondary | ICD-10-CM | POA: Insufficient documentation

## 2023-11-11 DIAGNOSIS — M5126 Other intervertebral disc displacement, lumbar region: Secondary | ICD-10-CM | POA: Diagnosis not present

## 2023-11-11 DIAGNOSIS — N189 Chronic kidney disease, unspecified: Secondary | ICD-10-CM | POA: Insufficient documentation

## 2023-11-11 DIAGNOSIS — R6510 Systemic inflammatory response syndrome (SIRS) of non-infectious origin without acute organ dysfunction: Secondary | ICD-10-CM | POA: Diagnosis not present

## 2023-11-11 DIAGNOSIS — Z8659 Personal history of other mental and behavioral disorders: Secondary | ICD-10-CM

## 2023-11-11 DIAGNOSIS — M545 Low back pain, unspecified: Secondary | ICD-10-CM

## 2023-11-11 DIAGNOSIS — R509 Fever, unspecified: Principal | ICD-10-CM | POA: Diagnosis present

## 2023-11-11 HISTORY — DX: Other specified health status: Z78.9

## 2023-11-11 LAB — CBC WITH DIFFERENTIAL/PLATELET
Abs Immature Granulocytes: 0.05 K/uL (ref 0.00–0.07)
Basophils Absolute: 0 K/uL (ref 0.0–0.1)
Basophils Relative: 0 %
Eosinophils Absolute: 0.1 K/uL (ref 0.0–0.5)
Eosinophils Relative: 1 %
HCT: 36.5 % (ref 36.0–46.0)
Hemoglobin: 12.5 g/dL (ref 12.0–15.0)
Immature Granulocytes: 0 %
Lymphocytes Relative: 13 %
Lymphs Abs: 1.7 K/uL (ref 0.7–4.0)
MCH: 30.7 pg (ref 26.0–34.0)
MCHC: 34.2 g/dL (ref 30.0–36.0)
MCV: 89.7 fL (ref 80.0–100.0)
Monocytes Absolute: 1.4 K/uL — ABNORMAL HIGH (ref 0.1–1.0)
Monocytes Relative: 10 %
Neutro Abs: 10 K/uL — ABNORMAL HIGH (ref 1.7–7.7)
Neutrophils Relative %: 76 %
Platelets: 168 K/uL (ref 150–400)
RBC: 4.07 MIL/uL (ref 3.87–5.11)
RDW: 11.9 % (ref 11.5–15.5)
WBC: 13.2 K/uL — ABNORMAL HIGH (ref 4.0–10.5)
nRBC: 0 % (ref 0.0–0.2)

## 2023-11-11 LAB — COMPREHENSIVE METABOLIC PANEL WITH GFR
ALT: 9 U/L (ref 0–44)
AST: 18 U/L (ref 15–41)
Albumin: 4.2 g/dL (ref 3.5–5.0)
Alkaline Phosphatase: 67 U/L (ref 38–126)
Anion gap: 12 (ref 5–15)
BUN: 8 mg/dL (ref 6–20)
CO2: 23 mmol/L (ref 22–32)
Calcium: 9.7 mg/dL (ref 8.9–10.3)
Chloride: 101 mmol/L (ref 98–111)
Creatinine, Ser: 0.83 mg/dL (ref 0.44–1.00)
GFR, Estimated: >60 mL/min (ref >60)
Glucose, Bld: 105 mg/dL — ABNORMAL HIGH (ref 70–99)
Potassium: 3.9 mmol/L (ref 3.5–5.1)
Sodium: 136 mmol/L (ref 135–145)
Total Bilirubin: 0.7 mg/dL (ref 0.0–1.2)
Total Protein: 8 g/dL (ref 6.5–8.1)

## 2023-11-11 LAB — RESP PANEL BY RT-PCR (RSV, FLU A&B, COVID)  RVPGX2
Influenza A by PCR: NEGATIVE
Influenza B by PCR: NEGATIVE
Resp Syncytial Virus by PCR: NEGATIVE
SARS Coronavirus 2 by RT PCR: NEGATIVE

## 2023-11-11 LAB — URINALYSIS, W/ REFLEX TO CULTURE (INFECTION SUSPECTED)
Bilirubin Urine: NEGATIVE
Glucose, UA: NEGATIVE mg/dL
Ketones, ur: NEGATIVE mg/dL
Leukocytes,Ua: NEGATIVE
Nitrite: NEGATIVE
Specific Gravity, Urine: 1.026 (ref 1.005–1.030)
pH: 6 (ref 5.0–8.0)

## 2023-11-11 LAB — LIPASE, BLOOD: Lipase: 16 U/L (ref 11–51)

## 2023-11-11 LAB — HCG, SERUM, QUALITATIVE: Preg, Serum: NEGATIVE

## 2023-11-11 LAB — SEDIMENTATION RATE: Sed Rate: 47 mm/h — ABNORMAL HIGH (ref 0–22)

## 2023-11-11 LAB — LACTIC ACID, PLASMA: Lactic Acid, Venous: 0.9 mmol/L (ref 0.5–1.9)

## 2023-11-11 MED ORDER — ACETAMINOPHEN 325 MG PO TABS
650.0000 mg | ORAL_TABLET | Freq: Once | ORAL | Status: AC | PRN
  Administered 2023-11-11: 650 mg via ORAL
  Filled 2023-11-11: qty 2

## 2023-11-11 MED ORDER — SODIUM CHLORIDE 0.9 % IV BOLUS
1000.0000 mL | Freq: Once | INTRAVENOUS | Status: AC
  Administered 2023-11-11: 1000 mL via INTRAVENOUS

## 2023-11-11 MED ORDER — SODIUM CHLORIDE 0.9 % IV SOLN
2.0000 g | Freq: Once | INTRAVENOUS | Status: AC
  Administered 2023-11-11: 2 g via INTRAVENOUS
  Filled 2023-11-11: qty 20

## 2023-11-11 MED ORDER — GADOBUTROL 1 MMOL/ML IV SOLN
7.0000 mL | Freq: Once | INTRAVENOUS | Status: AC | PRN
  Administered 2023-11-11: 7 mL via INTRAVENOUS

## 2023-11-11 NOTE — ED Triage Notes (Signed)
 Pt sent from [facility] for mri  she is now going to [facility]

## 2023-11-11 NOTE — ED Triage Notes (Addendum)
 Right flank pain since last week. Fever since Thursday. Headaches and dizzy today-neck stiff. Here recently for same symptoms-worsening since.

## 2023-11-11 NOTE — ED Notes (Signed)
 Patient verbalized understanding to drive self directly to [facility] ED. Patient Alert & Oriented x4, IV patent and secure. Directions provided to patient.

## 2023-11-11 NOTE — ED Provider Notes (Signed)
 33 year old female transferred from med center for MRI lumbar spine.  History obtained from patient on arrival.  She notes that she has had a cough since Tuesday, now slightly productive.  She developed right flank pain on Thursday with a fever.  She is unable to recall exactly what her fever was but notes that it was greater than 100.4.  She states that she was previously admitted for a week due to a kidney infection about a year ago.  She went to the emergency room with concern for that is what was going on.  Her workup at that time was overall reassuring and she was discharged.  Patient returns to the ER today with complaint of ongoing pain, fever, headache and feeling like she might pass out.  Patient was transferred for MRI lumbar spine.  Her lumbar spine is without findings to explain her symptoms today.  Patient denies any tick bites, vomiting, changes in bowel or bladder habits or vaginal discharge.  Last menstrual cycle is today.  She is nauseous.  She does not take any daily medications.  Her vaccines are up-to-date.  She denies any recent travel or sick contacts.  Labs reviewed from workup earlier today, she has a large amount of hemoglobin in her urine with trace protein, likely related to her menstrual cycle.  There is rare bacteria, sample is contaminated, urine culture was sent out and is pending.  Her hCG is negative.  Her lactic acid is reassuring at 0.9.  Her CBC shows a mild leukocytosis at 13.2 with increase in neutrophils, slightly uptrending from prior CBC at first ER workup.  CMP without significant findings.  Lipase normal.  Sed rate is slightly elevated at 47, CRP is pending.  Respiratory panel is negative for COVID, flu, RSV. Physical Exam  BP 130/73 (BP Location: Right Arm)   Pulse 79   Temp 99.4 F (37.4 C)   Resp 18   Ht 5' 4 (1.626 m)   Wt 71.2 kg   LMP 10/19/2023   SpO2 100%   BMI 26.95 kg/m   Physical Exam Vitals and nursing note reviewed.  Constitutional:       General: She is not in acute distress.    Appearance: She is well-developed. She is not ill-appearing, toxic-appearing or diaphoretic.     Comments: Well-appearing  HENT:     Head: Normocephalic and atraumatic.  Pulmonary:     Effort: Pulmonary effort is normal.  Abdominal:     Palpations: Abdomen is soft.     Tenderness: There is no abdominal tenderness. There is no right CVA tenderness or left CVA tenderness.  Musculoskeletal:        General: Normal range of motion.     Cervical back: Normal range of motion and neck supple. No rigidity or tenderness. Normal range of motion.     Right lower leg: No edema.     Left lower leg: No edema.  Skin:    General: Skin is warm and dry.     Findings: No erythema or rash.  Neurological:     Mental Status: She is alert and oriented to person, place, and time.  Psychiatric:        Behavior: Behavior normal.     Procedures  Procedures  ED Course / MDM    Medical Decision Making Amount and/or Complexity of Data Reviewed Labs: ordered. Radiology: ordered.  Risk OTC drugs. Prescription drug management.   ***

## 2023-11-11 NOTE — ED Provider Notes (Signed)
 [place] EMERGENCY DEPARTMENT AT [facility] Provider Note   CSN: [id number] Arrival date & time: 11/11/23  1416     Patient presents with: No chief complaint on file.   Morgan Galloway is a 33 y.o. female.  With past medical history of bipolar 1, chronic kidney disease presents to emergency room with complaint of right-sided flank pain/back pain for 3 days.  This started approximately 1 week ago.  It is associated with fever, nausea, headache, and dry cough. She denies any urinary symptoms.  Denies history of IV drug use, saddle anesthesia, urinary incontinence or bowel incontinence, history of cancer. No radicular symptoms.    HPI     Prior to Admission medications   Medication Sig Start Date End Date Taking? Authorizing Provider  acetaminophen  (TYLENOL ) 500 MG tablet Take 1,000 mg by mouth every 8 (eight) hours as needed for mild pain.    [provider]  calcium carbonate (TUMS - DOSED IN MG ELEMENTAL CALCIUM) 500 MG chewable tablet Chew 500 mg by mouth daily as needed for indigestion or heartburn.    [provider]  cyclobenzaprine  (FLEXERIL ) 10 MG tablet Take 10 mg by mouth at bedtime.    [provider]  ibuprofen  (ADVIL ) 600 MG tablet Take 1 tablet (600 mg total) by mouth every 6 (six) hours as needed for mild pain, moderate pain or cramping. 11/24/21   Pinn, Walda, MD  NIFEdipine  (ADALAT  CC) 60 MG 24 hr tablet Take 1 tablet (60 mg total) by mouth daily. 11/24/21   Pinn, Walda, MD  ondansetron  (ZOFRAN -ODT) 4 MG disintegrating tablet Take 4 mg by mouth every 8 (eight) hours as needed for nausea or vomiting. 04/14/21   [provider]  oxyCODONE  (OXY IR/ROXICODONE ) 5 MG immediate release tablet Take 1 tablet (5 mg total) by mouth every 4 (four) hours as needed (pain scale 4-7). 11/24/21   Pinn, Walda, MD  Prenat-FeFum-FePo-FA-Omega 3 (TARON-C DHA) 35-1 MG CAPS Take 1 capsule by mouth daily. 11/18/21   [provider]  promethazine   (PHENERGAN ) 25 MG tablet Take 1 tablet (25 mg total) by mouth every 6 (six) hours as needed for nausea or vomiting. 05/10/21   Jerilynn Longs, NP  senna-docusate (SENOKOT-S) 8.6-50 MG tablet Take 2 tablets by mouth at bedtime as needed for mild constipation. 11/24/21   Pinn, Walda, MD    Allergies: Lamotrigine    Review of Systems  Constitutional:  Positive for fever.    Updated Vital Signs BP (!) 99/59 (BP Location: Left Arm)   Pulse 98   Temp (!) 101.7 F (38.7 C)   Resp 20   Ht 5' 4 (1.626 m)   Wt 71.2 kg   LMP 10/19/2023   SpO2 100%   BMI 26.95 kg/m   Physical Exam Vitals and nursing note reviewed.  Constitutional:      General: She is not in acute distress.    Appearance: She is not toxic-appearing.  HENT:     Head: Normocephalic and atraumatic.  Eyes:     General: No scleral icterus.    Conjunctiva/sclera: Conjunctivae normal.  Cardiovascular:     Rate and Rhythm: Normal rate and regular rhythm.     Pulses: Normal pulses.     Heart sounds: Normal heart sounds.  Pulmonary:     Effort: Pulmonary effort is normal. No respiratory distress.     Breath sounds: Normal breath sounds.  Abdominal:     General: Abdomen is flat. Bowel sounds are normal.  Palpations: Abdomen is soft.     Tenderness: There is no abdominal tenderness. There is no right CVA tenderness or left CVA tenderness.  Musculoskeletal:     Comments: Right sided low back pain near paravertebral musculature.  No significant midline tenderness.  No CVA tenderness.  Skin:    General: Skin is warm and dry.     Findings: No lesion.  Neurological:     General: No focal deficit present.     Mental Status: She is alert and oriented to person, place, and time. Mental status is at baseline.     Comments: No lower extremity sensation change.  Strength is equal bilaterally.  Positive right-sided straight leg raise.     (all labs ordered are listed, but only abnormal results are displayed) Labs Reviewed   COMPREHENSIVE METABOLIC PANEL WITH GFR - Abnormal; Notable for the following components:      Result Value   Glucose, Bld 105 (*)    All other components within normal limits  CBC WITH DIFFERENTIAL/PLATELET - Abnormal; Notable for the following components:   WBC 13.2 (*)    Neutro Abs 10.0 (*)    Monocytes Absolute 1.4 (*)    All other components within normal limits  URINALYSIS, W/ REFLEX TO CULTURE (INFECTION SUSPECTED) - Abnormal; Notable for the following components:   Hgb urine dipstick LARGE (*)    Protein, ur TRACE (*)    Bacteria, UA RARE (*)    All other components within normal limits  RESP PANEL BY RT-PCR (RSV, FLU A&B, COVID)  RVPGX2  CULTURE, BLOOD (ROUTINE X 2)  CULTURE, BLOOD (ROUTINE X 2)  URINE CULTURE  LACTIC ACID, PLASMA  LIPASE, BLOOD  HCG, SERUM, QUALITATIVE    EKG: None  Radiology: No results found.   Procedures   Medications Ordered in the ED  acetaminophen  (TYLENOL ) tablet 650 mg (650 mg Oral Given 11/11/23 1443)  sodium chloride  0.9 % bolus 1,000 mL (1,000 mLs Intravenous New Bag/Given 11/11/23 1531)                                    Medical Decision Making Amount and/or Complexity of Data Reviewed Labs: ordered. Radiology: ordered.  Risk OTC drugs. Prescription drug management.   This patient presents to the ED for concern of flank pain, this involves an extensive number of treatment options, and is a complaint that carries with it a high risk of complications and morbidity.  The differential diagnosis includes kidney stone, pyelonephritis, urinary tract infection, appendicitis, cholecystitis, disc herniation   Co morbidities that complicate the patient evaluation  Hx of pyelonephritis    Lab Tests:  I personally interpreted labs.  The pertinent results include:   CBC with leukocytosis of 13.2 CMP without significant abnormality, normal kidney and liver function.  Lipase within normal limits.  UA shows rare bacteria but negative  for nitrites or leukocytes.  Will send urine for culture.  Blood cultures pending.   Imaging Studies ordered:  I ordered imaging studies including renal study I independently visualized and interpreted imaging which showed nonobstructive right nephrolithiasis without urolithiasis I agree with the radiologist interpretation   Cardiac Monitoring: / EKG:  The patient was maintained on a cardiac monitor.    Consultations Obtained:  I requested consultation with the on-call neurosurgery,  and discussed lab and imaging findings as well as pertinent plan - they recommend: Obtaining MRI and adding on ESR, CRP.  Problem List / ED Course / Critical interventions / Medication management  Patient presents to emergency room with complaint of right flank pain and fever.  She also notes some dry cough.  She recently had chest x-ray that was negative for pneumonia discharged with recommended symptomatic management.  Today she comes back with feeling lightheaded.  On arrival she is febrile with some low blood pressure readings thus ordered 2 L normal saline bolus and Rocephin . Vitals to improve. Given Tylenol  for fever.  She does have leukocytosis.  Normal liver and kidney function.  Normal lactic.  UA does have large amount of hemoglobin but patient reports she just started her period today this likely contributing to blood in urine.  No convincing urinary tract infection.  CT scan shows no convincing urolithiasis, does note L5 sclerotic lesion.  Given back pain and fever of unknown origin did discuss case with neurosurgery who feels sclerotic lesion is likely benign but does recommend obtaining MRI as well as ESR and CRP.  Will send her to [facility] for MRI to rule out abscess. Patient does endorse feeling better after fluids and is now tolerating oral intake.  I feel she continues to be stable and has unremarkable MRI she is likely appropriate for symptomatic management and follow-up for recheck with  primary care. Discussed with attending who agrees to plan.   Plan is to transfer to [facility] for MRI of lumbar spine with and without contrast to rule out abscess.  If this is negative plan for follow-up outpatient and symptom management.        Final diagnoses:  Fever, unspecified fever cause  Acute right-sided low back pain without sciatica    ED Discharge Orders     None          Shermon Warren SAILOR, PA-C 11/11/23 2252    Bari Roxie HERO, DO 11/12/23 0020

## 2023-11-12 ENCOUNTER — Other Ambulatory Visit (HOSPITAL_COMMUNITY): Payer: Self-pay

## 2023-11-12 ENCOUNTER — Encounter (HOSPITAL_COMMUNITY): Payer: Self-pay | Admitting: Internal Medicine

## 2023-11-12 DIAGNOSIS — R509 Fever, unspecified: Secondary | ICD-10-CM | POA: Diagnosis present

## 2023-11-12 DIAGNOSIS — R6510 Systemic inflammatory response syndrome (SIRS) of non-infectious origin without acute organ dysfunction: Secondary | ICD-10-CM | POA: Diagnosis present

## 2023-11-12 DIAGNOSIS — M5126 Other intervertebral disc displacement, lumbar region: Secondary | ICD-10-CM

## 2023-11-12 LAB — CBC
HCT: 34.6 % — ABNORMAL LOW (ref 36.0–46.0)
Hemoglobin: 11.6 g/dL — ABNORMAL LOW (ref 12.0–15.0)
MCH: 30.5 pg (ref 26.0–34.0)
MCHC: 33.5 g/dL (ref 30.0–36.0)
MCV: 91.1 fL (ref 80.0–100.0)
Platelets: 159 K/uL (ref 150–400)
RBC: 3.8 MIL/uL — ABNORMAL LOW (ref 3.87–5.11)
RDW: 11.7 % (ref 11.5–15.5)
WBC: 11.2 K/uL — ABNORMAL HIGH (ref 4.0–10.5)
nRBC: 0 % (ref 0.0–0.2)

## 2023-11-12 LAB — CSF CELL COUNT WITH DIFFERENTIAL
RBC Count, CSF: 1 /mm3 — ABNORMAL HIGH
RBC Count, CSF: 1 /mm3 — ABNORMAL HIGH
Tube #: 1
Tube #: 4
WBC, CSF: 2 /mm3 (ref 0–5)
WBC, CSF: 2 /mm3 (ref 0–5)

## 2023-11-12 LAB — RESPIRATORY PANEL BY PCR

## 2023-11-12 LAB — MENINGITIS/ENCEPHALITIS PANEL (CSF)

## 2023-11-12 LAB — PROTEIN AND GLUCOSE, CSF
Glucose, CSF: 63 mg/dL (ref 40–70)
Total  Protein, CSF: 23 mg/dL (ref 15–45)

## 2023-11-12 LAB — C-REACTIVE PROTEIN: CRP: 16.5 mg/dL — ABNORMAL HIGH (ref <1.0)

## 2023-11-12 LAB — HIV ANTIBODY (ROUTINE TESTING W REFLEX): HIV Screen 4th Generation wRfx: NONREACTIVE

## 2023-11-12 MED ORDER — DIAZEPAM 5 MG/ML IJ SOLN
5.0000 mg | Freq: Once | INTRAMUSCULAR | Status: AC
  Administered 2023-11-12: 5 mg via INTRAVENOUS
  Filled 2023-11-12: qty 2

## 2023-11-12 MED ORDER — SODIUM CHLORIDE 0.9 % IV SOLN: 2.0000 g | Freq: Two times a day (BID) | INTRAVENOUS | Status: DC

## 2023-11-12 MED ORDER — ACETAMINOPHEN 325 MG PO TABS
650.0000 mg | ORAL_TABLET | Freq: Four times a day (QID) | ORAL | Status: DC | PRN
  Administered 2023-11-12: 650 mg via ORAL

## 2023-11-12 MED ORDER — SODIUM CHLORIDE 0.9 % IV SOLN: Freq: Once | INTRAVENOUS | Status: AC

## 2023-11-12 MED ORDER — ACETAMINOPHEN 650 MG RE SUPP: 650.0000 mg | Freq: Four times a day (QID) | RECTAL | Status: DC | PRN

## 2023-11-12 MED ORDER — VANCOMYCIN HCL 1500 MG/300ML IV SOLN
1500.0000 mg | Freq: Once | INTRAVENOUS | Status: AC
  Administered 2023-11-12: 1500 mg via INTRAVENOUS
  Filled 2023-11-12: qty 300

## 2023-11-12 MED ORDER — VANCOMYCIN HCL IN DEXTROSE 1-5 GM/200ML-% IV SOLN: 1000.0000 mg | Freq: Once | INTRAVENOUS | Status: DC

## 2023-11-12 MED ORDER — VANCOMYCIN HCL 750 MG/150ML IV SOLN
750.0000 mg | Freq: Three times a day (TID) | INTRAVENOUS | Status: DC
  Filled 2023-11-12: qty 150

## 2023-11-12 MED ORDER — ACETAMINOPHEN 325 MG PO TABS
650.0000 mg | ORAL_TABLET | Freq: Once | ORAL | Status: AC
  Administered 2023-11-12: 650 mg via ORAL
  Filled 2023-11-12: qty 2

## 2023-11-12 MED ORDER — CEFADROXIL 500 MG PO CAPS
500.0000 mg | ORAL_CAPSULE | Freq: Two times a day (BID) | ORAL | 0 refills | Status: AC
  Filled 2023-11-12: qty 10, 5d supply, fill #0

## 2023-11-12 MED ORDER — SODIUM CHLORIDE 0.9 % IV SOLN
2.0000 g | INTRAVENOUS | Status: DC
  Administered 2023-11-12: 2 g via INTRAVENOUS
  Filled 2023-11-12: qty 20

## 2023-11-12 NOTE — Discharge Summary (Signed)
 Physician Discharge Summary  Morgan Galloway FMW:[id number] DOB: 1990-05-24 DOA: 11/11/2023  PCP: Stroud, Natalie M, FNP  Admit date: 11/11/2023 Discharge date: 11/12/2023  Admitted From: home Disposition:  home  Recommendations for Outpatient Follow-up:  Follow up with PCP in 1-2 weeks Follow up on urine cultures as an outpatient  Home Health: none Equipment/Devices: none  Discharge Condition: stable CODE STATUS: Full code Diet Orders (From admission, onward)     Start     Ordered   11/12/23 0651  Diet regular Fluid consistency: Thin  Diet effective now       Question:  Fluid consistency:  Answer:  Thin   11/12/23 0650            HPI: Per admitting MD, Morgan Galloway is a 33 y.o. female with medical history significant of anxiety, depression, bipolar disorder presenting with complaints of fever, nausea, right flank/low back pain, headaches, and neck stiffness.  Symptoms started 3 days ago.  She has also also been coughing this past week.  Denies shortness of breath or chest pain.  Denies abdominal pain or any episodes of vomiting.  Her menstrual period started today.  Denies dysuria or urinary frequency/urgency.  Reports slight neck stiffness but no neck pain.  She works in Teacher, music.   Hospital Course / Discharge diagnoses: Principal Problem:   Fever Active Problems:   History of depression   Protrusion of lumbar intervertebral disc   SIRS (systemic inflammatory response syndrome) (HCC)  Principal problem SIRS, probable UTI, possible passed stone -patient was admitted to the hospital with fever, as well as right-sided flank pain few days prior.  She is also complained of more foul-smelling urine in the last few days.  She denied any dysuria, nausea vomiting, lower abdominal pain or increased frequency.  Given urinalysis with positive dipstick hemoglobin, rare bacteria, as well as a CT scan renal stone study in the ED that showed nonobstructive punctate right  nephrolithiasis, it is possible that she may have passed the stone and also may have a UTI given changes in smell along with fever.  She was admitted to the hospital, placed on ceftriaxone  with significant improvement in her symptoms.  Flank pain and fever have resolved.  She is tolerating a regular diet, ambulating without difficulties and will be discharged home in stable condition to complete a course of antibiotics with cefadroxil   Active problems Fever, headache-likely headache was the result of the fever rather than something more ominous.  With resolution of her fever headache has resolved.  She underwent an LP in the ER which was negative for meningitis L5 findings- on MRI there was L5-S1 intermediate size right subarticular disc protrusion severely narrowing the right lateral recess with mass effect on the right S1 nerve root, also bone marrow edema at L5 with sclerotic focus in the anterior L5 vertebral body.  This is favored to be a benign process.  She has no tenderness in the area, no a radiating pain on the right leg and she is otherwise asymptomatic.  Monitor as an outpatient  Sepsis ruled out   Discharge Instructions   Allergies as of 11/12/2023       Reactions   Lamotrigine Swelling, Rash        Medication List     TAKE these medications    acetaminophen  500 MG tablet Commonly known as: TYLENOL  Take 1,000 mg by mouth 2 (two) times daily as needed for mild pain (pain score 1-3).   cefadroxil  500 MG capsule  Commonly known as: DURICEF Take 1 capsule (500 mg total) by mouth 2 (two) times daily for 5 days.        Follow-up Information     Stroud, Natalie M, FNP .   Specialty: Family Medicine Contact information: [address] [place] [address] [phone]                 Consultations: none  Procedures/Studies:  MR Lumbar Spine W Wo Contrast Result Date: 11/11/2023 EXAM: MRI LUMBAR SPINE 11/11/2023 10:13:08 PM TECHNIQUE:  Multiplanar multisequence MRI of the lumbar spine was performed with and without the administration of intravenous contrast. 7mL gadobutrol  (GADAVIST ) 1 MMOL/ML injection 7 mL GADOBUTROL  1 MMOL/ML IV SOLN. COMPARISON: Abdominal CT 11/11/2023. CLINICAL HISTORY: Low back pain, infection suspected, positive xray/CT. FINDINGS: BONES AND ALIGNMENT: Bone marrow edema at L5 with sclerotic focus in the anterior L5 vertebral body. No focal enhancing lesion. SPINAL CORD: The conus terminates normally. SOFT TISSUES: No paraspinal mass. L1-L2: No significant disc herniation. No spinal canal stenosis or neural foraminal narrowing. L2-L3: No significant disc herniation. No spinal canal stenosis or neural foraminal narrowing. L3-L4: No significant disc herniation. No spinal canal stenosis or neural foraminal narrowing. L4-L5: No significant disc herniation. No spinal canal stenosis or neural foraminal narrowing. L5-S1: Intermediate size right subarticular disc protrusion severely narrowing the right lateral recess with mass effect on the right S1 nerve root. No neural foraminal stenosis. IMPRESSION: 1. L5-S1 intermediate size right subarticular disc protrusion severely narrowing the right lateral recess with mass effect on the right S1 nerve root. No neural foraminal stenosis. 2. Bone marrow edema at L5 with sclerotic focus in the anterior L5 vertebral body. No focal enhancing lesion. This is favored to be a benign process. Electronically signed by: Franky Stanford MD 11/11/2023 10:19 PM EDT RP Workstation: [id number]   CT Renal Stone Study Result Date: 11/11/2023 CLINICAL DATA:  Abdominal/flank pain, stone suspected Right sided flank pain. Right flank pain onset Thursday. History of kidney stones, tubal ligation. Negative HCG serum. EXAM: CT ABDOMEN AND PELVIS WITHOUT CONTRAST TECHNIQUE: Multidetector CT imaging of the abdomen and pelvis was performed following the standard protocol without IV contrast. RADIATION DOSE REDUCTION:  This exam was performed according to the departmental dose-optimization program which includes automated exposure control, adjustment of the mA and/or kV according to patient size and/or use of iterative reconstruction technique. COMPARISON:  CT renal 03/02/2020 FINDINGS: Lower chest: No acute abnormality. Hepatobiliary: No focal liver abnormality. Contracted gallbladder. No gallstones, gallbladder wall thickening, or pericholecystic fluid. No biliary dilatation. Pancreas: No focal lesion. Normal pancreatic contour. No surrounding inflammatory changes. No main pancreatic ductal dilatation. Spleen: Normal in size without focal abnormality. Adrenals/Urinary Tract: No adrenal nodule bilaterally. Punctate right nephrolithiasis. Similar-appearing couple consecutive 3 mm calcified stone in the region of the right ureterovesicular junction likely external to the ureter and representing phleboliths. No hydronephrosis. No definite contour-deforming renal mass. No definite ureterolithiasis or hydroureter. The urinary bladder is unremarkable. Stomach/Bowel: Stomach is within normal limits. No evidence of bowel wall thickening or dilatation. Appendix appears normal (5:47). Vascular/Lymphatic: Phleboliths noted within the pelvis appear stable from 2021. No abdominal aorta or iliac aneurysm. No abdominal, pelvic, or inguinal lymphadenopathy. Reproductive: Uterus and bilateral adnexa are unremarkable. Other: No intraperitoneal free fluid. No intraperitoneal free gas. No organized fluid collection. Musculoskeletal: Diastasis rectus.  Tiny fat containing umbilical hernia. Interval development of indeterminate densely sclerotic lesion of the L5 vertebral body. Right sacroiliac joint degenerative changes and sclerosis. No acute  displaced fracture. Multilevel degenerative changes of the spine. IMPRESSION: 1. Nonobstructive punctate right nephrolithiasis. No definite ureterolithiasis. Multiple phleboliths within the pelvis. 2. Interval  development of indeterminate densely sclerotic lesion of the L5 vertebral body. Recommend attention on follow-up. Electronically Signed   By: Morgane  Naveau M.D.   On: 11/11/2023 17:29   DG Chest 2 View Result Date: 11/08/2023 CLINICAL DATA:  Cough and right-sided pain, initial encounter EXAM: CHEST - 2 VIEW COMPARISON:  03/03/2013 FINDINGS: Cardiac shadow is within normal limits. The lungs are well aerated bilaterally. No focal infiltrate or effusion is seen. No bony abnormality is noted. IMPRESSION: No active cardiopulmonary disease. Electronically Signed   By: Oneil Devonshire M.D.   On: 11/08/2023 23:18     Subjective: - no chest pain, shortness of breath, no abdominal pain, nausea or vomiting.   Discharge Exam: BP 111/60 (BP Location: Right Arm)   Pulse 60   Temp 98.1 F (36.7 C) (Oral)   Resp 16   Ht 5' 4 (1.626 m)   Wt 71.2 kg   LMP 10/19/2023   SpO2 100%   BMI 26.95 kg/m   General: Pt is alert, awake, not in acute distress Cardiovascular: RRR, S1/S2 +, no rubs, no gallops Respiratory: CTA bilaterally, no wheezing, no rhonchi Abdominal: Soft, NT, ND, bowel sounds + Extremities: no edema, no cyanosis    The results of significant diagnostics from this hospitalization (including imaging, microbiology, ancillary and laboratory) are listed below for reference.     Microbiology: Recent Results (from the past 240 hours)  Resp panel by RT-PCR (RSV, Flu A&B, Covid) Anterior Nasal Swab     Status: None   Collection Time: 11/08/23 11:37 PM   Specimen: Anterior Nasal Swab  Result Value Ref Range Status   SARS Coronavirus 2 by RT PCR NEGATIVE NEGATIVE Final    Comment: (NOTE) SARS-CoV-2 target nucleic acids are NOT DETECTED.  The SARS-CoV-2 RNA is generally detectable in upper respiratory specimens during the acute phase of infection. The lowest concentration of SARS-CoV-2 viral copies this assay can detect is 138 copies/mL. A negative result does not preclude  SARS-Cov-2 infection and should not be used as the sole basis for treatment or other patient management decisions. A negative result may occur with  improper specimen collection/handling, submission of specimen other than nasopharyngeal swab, presence of viral mutation(s) within the areas targeted by this assay, and inadequate number of viral copies(<138 copies/mL). A negative result must be combined with clinical observations, patient history, and epidemiological information. The expected result is Negative.  Fact Sheet for Patients:  BloggerCourse.com  Fact Sheet for Healthcare Providers:  SeriousBroker.it  This test is no t yet approved or cleared by the [place]  FDA and  has been authorized for detection and/or diagnosis of SARS-CoV-2 by FDA under an Emergency Use Authorization (EUA). This EUA will remain  in effect (meaning this test can be used) for the duration of the COVID-19 declaration under Section 564(b)(1) of the Act, 21 U.S.C.section 360bbb-3(b)(1), unless the authorization is terminated  or revoked sooner.       Influenza A by PCR NEGATIVE NEGATIVE Final   Influenza B by PCR NEGATIVE NEGATIVE Final    Comment: (NOTE) The Xpert Xpress SARS-CoV-2/FLU/RSV plus assay is intended as an aid in the diagnosis of influenza from Nasopharyngeal swab specimens and should not be used as a sole basis for treatment. Nasal washings and aspirates are unacceptable for Xpert Xpress SARS-CoV-2/FLU/RSV testing.  Fact Sheet for Patients: BloggerCourse.com  Fact Sheet  for Healthcare Providers: SeriousBroker.it  This test is not yet approved or cleared by the [place]  FDA and has been authorized for detection and/or diagnosis of SARS-CoV-2 by FDA under an Emergency Use Authorization (EUA). This EUA will remain in effect (meaning this test can be used) for the duration of  the COVID-19 declaration under Section 564(b)(1) of the Act, 21 U.S.C. section 360bbb-3(b)(1), unless the authorization is terminated or revoked.     Resp Syncytial Virus by PCR NEGATIVE NEGATIVE Final    Comment: (NOTE) Fact Sheet for Patients: BloggerCourse.com  Fact Sheet for Healthcare Providers: SeriousBroker.it  This test is not yet approved or cleared by the [place]  FDA and has been authorized for detection and/or diagnosis of SARS-CoV-2 by FDA under an Emergency Use Authorization (EUA). This EUA will remain in effect (meaning this test can be used) for the duration of the COVID-19 declaration under Section 564(b)(1) of the Act, 21 U.S.C. section 360bbb-3(b)(1), unless the authorization is terminated or revoked.  Performed at [facility], [address], [place], [place] [address]   Blood culture (routine x 2)     Status: None (Preliminary result)   Collection Time: 11/11/23  2:34 PM   Specimen: BLOOD LEFT ARM  Result Value Ref Range Status   Specimen Description   Final    BLOOD LEFT ARM Performed at [facility], [address], [place], [place] [address]    Special Requests   Final    BOTTLES DRAWN AEROBIC AND ANAEROBIC Blood Culture results may not be optimal due to an inadequate volume of blood received in culture bottles Performed at [facility], [address], [place], [place] [address]    Culture   Final    NO GROWTH < 12 HOURS Performed at [facility], [address], [place], [place] [address]    Report Status PENDING  Incomplete  Resp panel by RT-PCR (RSV, Flu A&B, Covid) Anterior Nasal Swab     Status: None   Collection Time: 11/11/23  3:22 PM   Specimen: Anterior Nasal Swab  Result Value Ref Range Status   SARS Coronavirus 2 by RT PCR NEGATIVE NEGATIVE Final    Comment: (NOTE) SARS-CoV-2 target nucleic acids are NOT DETECTED.  The  SARS-CoV-2 RNA is generally detectable in upper respiratory specimens during the acute phase of infection. The lowest concentration of SARS-CoV-2 viral copies this assay can detect is 138 copies/mL. A negative result does not preclude SARS-Cov-2 infection and should not be used as the sole basis for treatment or other patient management decisions. A negative result may occur with  improper specimen collection/handling, submission of specimen other than nasopharyngeal swab, presence of viral mutation(s) within the areas targeted by this assay, and inadequate number of viral copies(<138 copies/mL). A negative result must be combined with clinical observations, patient history, and epidemiological information. The expected result is Negative.  Fact Sheet for Patients:  BloggerCourse.com  Fact Sheet for Healthcare Providers:  SeriousBroker.it  This test is no t yet approved or cleared by the [place]  FDA and  has been authorized for detection and/or diagnosis of SARS-CoV-2 by FDA under an Emergency Use Authorization (EUA). This EUA will remain  in effect (meaning this test can be used) for the duration of the COVID-19 declaration under Section 564(b)(1) of the Act, 21 U.S.C.section 360bbb-3(b)(1), unless the authorization is terminated  or revoked sooner.       Influenza A by PCR NEGATIVE NEGATIVE Final   Influenza  B by PCR NEGATIVE NEGATIVE Final    Comment: (NOTE) The Xpert Xpress SARS-CoV-2/FLU/RSV plus assay is intended as an aid in the diagnosis of influenza from Nasopharyngeal swab specimens and should not be used as a sole basis for treatment. Nasal washings and aspirates are unacceptable for Xpert Xpress SARS-CoV-2/FLU/RSV testing.  Fact Sheet for Patients: BloggerCourse.com  Fact Sheet for Healthcare Providers: SeriousBroker.it  This test is not yet approved or  cleared by the [place]  FDA and has been authorized for detection and/or diagnosis of SARS-CoV-2 by FDA under an Emergency Use Authorization (EUA). This EUA will remain in effect (meaning this test can be used) for the duration of the COVID-19 declaration under Section 564(b)(1) of the Act, 21 U.S.C. section 360bbb-3(b)(1), unless the authorization is terminated or revoked.     Resp Syncytial Virus by PCR NEGATIVE NEGATIVE Final    Comment: (NOTE) Fact Sheet for Patients: BloggerCourse.com  Fact Sheet for Healthcare Providers: SeriousBroker.it  This test is not yet approved or cleared by the [place]  FDA and has been authorized for detection and/or diagnosis of SARS-CoV-2 by FDA under an Emergency Use Authorization (EUA). This EUA will remain in effect (meaning this test can be used) for the duration of the COVID-19 declaration under Section 564(b)(1) of the Act, 21 U.S.C. section 360bbb-3(b)(1), unless the authorization is terminated or revoked.  Performed at [facility], [address] [place], [place] [address]   CSF culture w Gram Stain     Status: None (Preliminary result)   Collection Time: 11/12/23  1:28 AM   Specimen: CSF; Cerebrospinal Fluid  Result Value Ref Range Status   Specimen Description CSF  Final   Special Requests LF  Final   Gram Stain   Final    NO WBC SEEN NO ORGANISMS SEEN Performed at [facility], [address], [place], [place] [address]    Culture PENDING  Incomplete   Report Status PENDING  Incomplete  Respiratory (~20 pathogens) panel by PCR     Status: None   Collection Time: 11/12/23 11:15 AM   Specimen: Nasopharyngeal Swab; Respiratory  Result Value Ref Range Status   Adenovirus NOT DETECTED NOT DETECTED Final   Coronavirus 229E NOT DETECTED NOT DETECTED Final    Comment: (NOTE) The Coronavirus on the Respiratory Panel, DOES NOT test for the novel   Coronavirus (2019 nCoV)    Coronavirus HKU1 NOT DETECTED NOT DETECTED Final   Coronavirus NL63 NOT DETECTED NOT DETECTED Final   Coronavirus OC43 NOT DETECTED NOT DETECTED Final   Metapneumovirus NOT DETECTED NOT DETECTED Final   Rhinovirus / Enterovirus NOT DETECTED NOT DETECTED Final   Influenza A NOT DETECTED NOT DETECTED Final   Influenza B NOT DETECTED NOT DETECTED Final   Parainfluenza Virus 1 NOT DETECTED NOT DETECTED Final   Parainfluenza Virus 2 NOT DETECTED NOT DETECTED Final   Parainfluenza Virus 3 NOT DETECTED NOT DETECTED Final   Parainfluenza Virus 4 NOT DETECTED NOT DETECTED Final   Respiratory Syncytial Virus NOT DETECTED NOT DETECTED Final   Bordetella pertussis NOT DETECTED NOT DETECTED Final   Bordetella Parapertussis NOT DETECTED NOT DETECTED Final   Chlamydophila pneumoniae NOT DETECTED NOT DETECTED Final   Mycoplasma pneumoniae NOT DETECTED NOT DETECTED Final    Comment: Performed at [facility], [address] [place], [place] [address]     Labs: Basic Metabolic Panel: Recent Labs  Lab 11/08/23 2143 11/11/23 1445  NA 135 136  K 3.6 3.9  CL 101 101  CO2 22 23  GLUCOSE 103* 105*  BUN 9 8  CREATININE 0.91 0.83  CALCIUM 9.5 9.7   Liver Function Tests: Recent Labs  Lab 11/08/23 2143 11/11/23 1445  AST 16 18  ALT 8 9  ALKPHOS 67 67  BILITOT 0.8 0.7  PROT 7.7 8.0  ALBUMIN 4.6 4.2   CBC: Recent Labs  Lab 11/08/23 2143 11/11/23 1445 11/12/23 0627  WBC 11.9* 13.2* 11.2*  NEUTROABS 8.6* 10.0*  --   HGB 13.3 12.5 11.6*  HCT 38.5 36.5 34.6*  MCV 88.5 89.7 91.1  PLT 158 168 159   CBG: No results for input(s): GLUCAP in the last 168 hours. Hgb A1c No results for input(s): HGBA1C in the last 72 hours. Lipid Profile No results for input(s): CHOL, HDL, LDLCALC, TRIG, CHOLHDL, LDLDIRECT in the last 72 hours. Thyroid function studies No results for input(s): TSH, T4TOTAL, T3FREE, THYROIDAB in the last 72  hours.  Invalid input(s): FREET3 Urinalysis    Component Value Date/Time   COLORURINE YELLOW 11/11/2023 1520   APPEARANCEUR CLEAR 11/11/2023 1520   LABSPEC 1.026 11/11/2023 1520   PHURINE 6.0 11/11/2023 1520   GLUCOSEU NEGATIVE 11/11/2023 1520   HGBUR LARGE (A) 11/11/2023 1520   BILIRUBINUR NEGATIVE 11/11/2023 1520   BILIRUBINUR neg 08/19/2020 1556   KETONESUR NEGATIVE 11/11/2023 1520   PROTEINUR TRACE (A) 11/11/2023 1520   UROBILINOGEN 1.0 08/19/2020 1556   UROBILINOGEN 4.0 (H) 04/26/2020 2033   NITRITE NEGATIVE 11/11/2023 1520   LEUKOCYTESUR NEGATIVE 11/11/2023 1520    FURTHER DISCHARGE INSTRUCTIONS:   Get Medicines reviewed and adjusted: Please take all your medications with you for your next visit with your Primary MD   Laboratory/radiological data: Please request your Primary MD to go over all hospital tests and procedure/radiological results at the follow up, please ask your Primary MD to get all Hospital records sent to his/her office.   In some cases, they will be blood work, cultures and biopsy results pending at the time of your discharge. Please request that your primary care M.D. goes through all the records of your hospital data and follows up on these results.   Also Note the following: If you experience worsening of your admission symptoms, develop shortness of breath, life threatening emergency, suicidal or homicidal thoughts you must seek medical attention immediately by calling 911 or calling your MD immediately  if symptoms less severe.   You must read complete instructions/literature along with all the possible adverse reactions/side effects for all the Medicines you take and that have been prescribed to you. Take any new Medicines after you have completely understood and accpet all the possible adverse reactions/side effects.    Do not drive when taking Pain medications or sleeping medications (Benzodaizepines)   Do not take more than prescribed Pain,  Sleep and Anxiety Medications. It is not advisable to combine anxiety,sleep and pain medications without talking with your primary care practitioner   Special Instructions: If you have smoked or chewed Tobacco  in the last 2 yrs please stop smoking, stop any regular Alcohol  and or any Recreational drug use.   Wear Seat belts while driving.   Please note: You were cared for by a hospitalist during your hospital stay. Once you are discharged, your primary care physician will handle any further medical issues. Please note that NO REFILLS for any discharge medications will be authorized once you are discharged, as it is imperative that you return to your primary care physician (or establish  a relationship with a primary care physician if you do not have one) for your post hospital discharge needs so that they can reassess your need for medications and monitor your lab values.  Time coordinating discharge: 35 minutes  SIGNED:  Nilda Fendt, MD, PhD 11/12/2023, 3:49 PM

## 2023-11-12 NOTE — Progress Notes (Signed)
 Discharge instructions completed with pt.  Copy of instructions given.  20g IV removed from left antecubital with tip intact.  All questions answered.

## 2023-11-12 NOTE — ED Notes (Signed)
 Providers at the bedside for LP.

## 2023-11-12 NOTE — H&P (Signed)
 History and Physical    Morgan Galloway FMW:[id number] DOB: 1991-02-18 DOA: 11/11/2023  PCP: Joan Laneta HERO, FNP  Patient coming from: Home  Chief Complaint: Fever  HPI: Morgan Galloway is a 33 y.o. female with medical history significant of anxiety, depression, bipolar disorder presenting with complaints of fever, nausea, right flank/low back pain, headaches, and neck stiffness.  Symptoms started 3 days ago.  She has also also been coughing this past week.  Denies shortness of breath or chest pain.  Denies abdominal pain or any episodes of vomiting.  Her menstrual period started today.  Denies dysuria or urinary frequency/urgency.  Reports slight neck stiffness but no neck pain.  She works in Teacher, music.  ED Course: Temperature 101.7 F on arrival.  Labs notable for WBC count 13.2, hemoglobin 12.5, creatinine 0.8, normal lipase and LFTs, lactic acid normal, blood cultures pending, COVID/influenza/RSV PCR negative, serum hCG negative, ESR 47, CRP pending.  UA with negative nitrite, negative leukocytes, and microscopy showing 6-10 RBCs, 6-10 WBCs, and rare bacteria.  Urine culture pending.  CT renal stone study showing nonobstructive punctate right nephrolithiasis, no definite ureterolithiasis, and multiple phleboliths within the pelvis.  MRI of lumbar spine showing L5-S1 indeterminate size right subarticular disc protrusion severely narrowing the right lateral recess with mass effect on the right S1 nerve root.  No neuroforaminal stenosis.  Bone marrow edema at L5 with sclerotic focus in the anterior L5 vertebral body.  No focal enhancing lesion and this is favored to be a benign process.  Patient was given Tylenol , Valium , ceftriaxone , and 2 L IV fluids.  EDP did lumbar puncture and sent off labs for CSF analysis to rule out meningitis.   Review of Systems:  Review of Systems  All other systems reviewed and are negative.   Past Medical History:  Diagnosis Date   Anxiety    Bipolar 1  disorder, manic, mild (HCC)    Chronic kidney disease    Depression    Zoloft in past; stopped due to potential of pregnancy.   Gonorrhea    Headache    Vaginal Pap smear, abnormal    f/u ok    Past Surgical History:  Procedure Laterality Date   TUBAL LIGATION N/A 11/22/2021   Procedure: POST PARTUM TUBAL LIGATION;  Surgeon: Bettina Muskrat, MD;  Location: [facility];  Service: Gynecology;  Laterality: N/A;   WISDOM TOOTH EXTRACTION       reports that she has quit smoking. Her smoking use included cigarettes. She has a 7.5 pack-year smoking history. She has never used smokeless tobacco. She reports that she does not currently use alcohol. She reports that she does not currently use drugs.  Allergies  Allergen Reactions   Lamotrigine Swelling and Rash    Family History  Problem Relation Age of Onset   Bipolar disorder Mother    Stroke Father 3   Bipolar disorder Sister    Healthy Daughter    Healthy Son    Cancer Maternal Grandmother    Other Neg Hx     Prior to Admission medications   Medication Sig Start Date End Date Taking? Authorizing Provider  acetaminophen  (TYLENOL ) 500 MG tablet Take 1,000 mg by mouth 2 (two) times daily as needed for mild pain (pain score 1-3).   Yes [provider]    Physical Exam: Vitals:   11/12/23 0305 11/12/23 0310 11/12/23 0315 11/12/23 0320  BP: (!) 112/59     Pulse: 67 61 67 64  Resp: ROLLEN)  22 20 19  (!) 23  Temp:      TempSrc:      SpO2: 100% 100% 100% 100%  Weight:      Height:        Physical Exam Vitals reviewed.  Constitutional:      General: She is not in acute distress. HENT:     Head: Normocephalic and atraumatic.  Eyes:     Extraocular Movements: Extraocular movements intact.  Cardiovascular:     Rate and Rhythm: Normal rate and regular rhythm.     Pulses: Normal pulses.  Pulmonary:     Effort: Pulmonary effort is normal. No respiratory distress.     Breath sounds: Normal breath sounds. No wheezing, rhonchi  or rales.  Abdominal:     General: Bowel sounds are normal. There is no distension.     Palpations: Abdomen is soft.     Tenderness: There is no abdominal tenderness. There is no guarding.  Musculoskeletal:     Cervical back: Normal range of motion. No rigidity.     Right lower leg: No edema.     Left lower leg: No edema.  Skin:    General: Skin is warm and dry.  Neurological:     General: No focal deficit present.     Mental Status: She is alert and oriented to person, place, and time.     Cranial Nerves: No cranial nerve deficit.     Sensory: No sensory deficit.     Motor: No weakness.     Labs on Admission: I have personally reviewed following labs and imaging studies  CBC: Recent Labs  Lab 11/08/23 2143 11/11/23 1445  WBC 11.9* 13.2*  NEUTROABS 8.6* 10.0*  HGB 13.3 12.5  HCT 38.5 36.5  MCV 88.5 89.7  PLT 158 168   Basic Metabolic Panel: Recent Labs  Lab 11/08/23 2143 11/11/23 1445  NA 135 136  K 3.6 3.9  CL 101 101  CO2 22 23  GLUCOSE 103* 105*  BUN 9 8  CREATININE 0.91 0.83  CALCIUM 9.5 9.7   GFR: Estimated Creatinine Clearance: 92.4 mL/min (by C-G formula based on SCr of 0.83 mg/dL). Liver Function Tests: Recent Labs  Lab 11/08/23 2143 11/11/23 1445  AST 16 18  ALT 8 9  ALKPHOS 67 67  BILITOT 0.8 0.7  PROT 7.7 8.0  ALBUMIN 4.6 4.2   Recent Labs  Lab 11/11/23 1453  LIPASE 16   No results for input(s): AMMONIA in the last 168 hours. Coagulation Profile: No results for input(s): INR, PROTIME in the last 168 hours. Cardiac Enzymes: No results for input(s): CKTOTAL, CKMB, CKMBINDEX, TROPONINI in the last 168 hours. BNP (last 3 results) No results for input(s): PROBNP in the last 8760 hours. HbA1C: No results for input(s): HGBA1C in the last 72 hours. CBG: No results for input(s): GLUCAP in the last 168 hours. Lipid Profile: No results for input(s): CHOL, HDL, LDLCALC, TRIG, CHOLHDL, LDLDIRECT in the last  72 hours. Thyroid Function Tests: No results for input(s): TSH, T4TOTAL, FREET4, T3FREE, THYROIDAB in the last 72 hours. Anemia Panel: No results for input(s): VITAMINB12, FOLATE, FERRITIN, TIBC, IRON, RETICCTPCT in the last 72 hours. Urine analysis:    Component Value Date/Time   COLORURINE YELLOW 11/11/2023 1520   APPEARANCEUR CLEAR 11/11/2023 1520   LABSPEC 1.026 11/11/2023 1520   PHURINE 6.0 11/11/2023 1520   GLUCOSEU NEGATIVE 11/11/2023 1520   HGBUR LARGE (A) 11/11/2023 1520   BILIRUBINUR NEGATIVE 11/11/2023 1520   BILIRUBINUR neg 08/19/2020  1556   KETONESUR NEGATIVE 11/11/2023 1520   PROTEINUR TRACE (A) 11/11/2023 1520   UROBILINOGEN 1.0 08/19/2020 1556   UROBILINOGEN 4.0 (H) 04/26/2020 2033   NITRITE NEGATIVE 11/11/2023 1520   LEUKOCYTESUR NEGATIVE 11/11/2023 1520    Radiological Exams on Admission: MR Lumbar Spine W Wo Contrast Result Date: 11/11/2023 EXAM: MRI LUMBAR SPINE 11/11/2023 10:13:08 PM TECHNIQUE: Multiplanar multisequence MRI of the lumbar spine was performed with and without the administration of intravenous contrast. 7mL gadobutrol  (GADAVIST ) 1 MMOL/ML injection 7 mL GADOBUTROL  1 MMOL/ML IV SOLN. COMPARISON: Abdominal CT 11/11/2023. CLINICAL HISTORY: Low back pain, infection suspected, positive xray/CT. FINDINGS: BONES AND ALIGNMENT: Bone marrow edema at L5 with sclerotic focus in the anterior L5 vertebral body. No focal enhancing lesion. SPINAL CORD: The conus terminates normally. SOFT TISSUES: No paraspinal mass. L1-L2: No significant disc herniation. No spinal canal stenosis or neural foraminal narrowing. L2-L3: No significant disc herniation. No spinal canal stenosis or neural foraminal narrowing. L3-L4: No significant disc herniation. No spinal canal stenosis or neural foraminal narrowing. L4-L5: No significant disc herniation. No spinal canal stenosis or neural foraminal narrowing. L5-S1: Intermediate size right subarticular disc protrusion  severely narrowing the right lateral recess with mass effect on the right S1 nerve root. No neural foraminal stenosis. IMPRESSION: 1. L5-S1 intermediate size right subarticular disc protrusion severely narrowing the right lateral recess with mass effect on the right S1 nerve root. No neural foraminal stenosis. 2. Bone marrow edema at L5 with sclerotic focus in the anterior L5 vertebral body. No focal enhancing lesion. This is favored to be a benign process. Electronically signed by: Franky Stanford MD 11/11/2023 10:19 PM EDT RP Workstation: [id number]   CT Renal Stone Study Result Date: 11/11/2023 CLINICAL DATA:  Abdominal/flank pain, stone suspected Right sided flank pain. Right flank pain onset Thursday. History of kidney stones, tubal ligation. Negative HCG serum. EXAM: CT ABDOMEN AND PELVIS WITHOUT CONTRAST TECHNIQUE: Multidetector CT imaging of the abdomen and pelvis was performed following the standard protocol without IV contrast. RADIATION DOSE REDUCTION: This exam was performed according to the departmental dose-optimization program which includes automated exposure control, adjustment of the mA and/or kV according to patient size and/or use of iterative reconstruction technique. COMPARISON:  CT renal 03/02/2020 FINDINGS: Lower chest: No acute abnormality. Hepatobiliary: No focal liver abnormality. Contracted gallbladder. No gallstones, gallbladder wall thickening, or pericholecystic fluid. No biliary dilatation. Pancreas: No focal lesion. Normal pancreatic contour. No surrounding inflammatory changes. No main pancreatic ductal dilatation. Spleen: Normal in size without focal abnormality. Adrenals/Urinary Tract: No adrenal nodule bilaterally. Punctate right nephrolithiasis. Similar-appearing couple consecutive 3 mm calcified stone in the region of the right ureterovesicular junction likely external to the ureter and representing phleboliths. No hydronephrosis. No definite contour-deforming renal mass. No  definite ureterolithiasis or hydroureter. The urinary bladder is unremarkable. Stomach/Bowel: Stomach is within normal limits. No evidence of bowel wall thickening or dilatation. Appendix appears normal (5:47). Vascular/Lymphatic: Phleboliths noted within the pelvis appear stable from 2021. No abdominal aorta or iliac aneurysm. No abdominal, pelvic, or inguinal lymphadenopathy. Reproductive: Uterus and bilateral adnexa are unremarkable. Other: No intraperitoneal free fluid. No intraperitoneal free gas. No organized fluid collection. Musculoskeletal: Diastasis rectus.  Tiny fat containing umbilical hernia. Interval development of indeterminate densely sclerotic lesion of the L5 vertebral body. Right sacroiliac joint degenerative changes and sclerosis. No acute displaced fracture. Multilevel degenerative changes of the spine. IMPRESSION: 1. Nonobstructive punctate right nephrolithiasis. No definite ureterolithiasis. Multiple phleboliths within the pelvis. 2. Interval development of indeterminate densely sclerotic  lesion of the L5 vertebral body. Recommend attention on follow-up. Electronically Signed   By: Morgane  Naveau M.D.   On: 11/11/2023 17:29    Assessment and Plan  Fever Patient is endorsing right-sided flank/low back pain but no urinary symptoms.  UA not strongly suggestive of infection but urine culture pending to confirm.  No obvious infectious source identified on CT renal stone study and MRI of lumbar spine.  She is endorsing cough but no dyspnea.  Lungs clear on exam, not hypoxic, and chest x-ray done during recent ED visit on 7/17 was not suggestive of pneumonia.  COVID/influenza/RSV PCR negative.  Mild leukocytosis on labs.  No tachycardia or lactic acidosis.  No signs of sepsis.  Given complaints of headaches and slight neck stiffness, patient underwent lumbar puncture in the ED and CSF analysis labs pending to rule out meningitis.  Will continue empiric antibiotic coverage with vancomycin  and  ceftriaxone .  Trend WBC count.  Follow-up blood cultures.  L5-S1 right subarticular disc protrusion with mass effect on the right S1 nerve root This could explain the patient's right-sided low back pain.  She has no neurologic deficits and will need outpatient follow-up for further evaluation.  Anxiety/depression/bipolar disorder Not on medications at home.  DVT prophylaxis: SCDs Code Status: Full Code (discussed with the patient) Level of care: Progressive Care Unit Admission status: It is my clinical opinion that referral for OBSERVATION is reasonable and necessary in this patient based on the above information provided. The aforementioned taken together are felt to place the patient at high risk for further clinical deterioration. However, it is anticipated that the patient may be medically stable for discharge from the hospital within 24 to 48 hours.  Editha Ram MD [facility]  If 7PM-7AM, please contact night-coverage [email or web address]  11/12/2023, 3:29 AM

## 2023-11-12 NOTE — Progress Notes (Signed)
 Pharmacy Antibiotic Note  Morgan Galloway is a 33 y.o. female admitted on 11/11/2023 with pain, fever, headache . Abx starting empiric empiric meningitis.  Pharmacy has been consulted for Vancomycin  dosing. Pt also on Rocephin .  Plan: Vancomycin  1500mg  IV load then 750mg  Q 8 hrs.  Will f/u renal function, micro data, and pt's clinical condition Vanc trough prn   Height: 5' 4 (162.6 cm) Weight: 71.2 kg (157 lb) IBW/kg (Calculated) : 54.7  Temp (24hrs), Avg:99.7 F (37.6 C), Min:98.3 F (36.8 C), Max:101.7 F (38.7 C)  Recent Labs  Lab 11/08/23 2142 11/08/23 2143 11/08/23 2337 11/11/23 1445  WBC  --  11.9*  --  13.2*  CREATININE  --  0.91  --  0.83  LATICACIDVEN 0.9  --  0.9 0.9    Estimated Creatinine Clearance: 92.4 mL/min (by C-G formula based on SCr of 0.83 mg/dL).    Allergies  Allergen Reactions   Lamotrigine Swelling and Rash    Antimicrobials this admission: 7/20 Rocephin  >>  7/21 Vanc >>   Microbiology results: 7/20 BCx:  7/20 UCx:   7/20 CSF:   Thank you for allowing pharmacy to be a part of this patient's care.  Vito Ralph, PharmD, BCPS Please see amion for complete clinical pharmacist phone list 11/12/2023 3:49 AM

## 2023-11-12 NOTE — Plan of Care (Signed)

## 2023-11-12 NOTE — Progress Notes (Signed)
 Pt ambulated off unit with staff member.  Discharged home in stable condition with all belongings.

## 2023-11-14 LAB — URINE CULTURE: Culture: >=100000 — AB

## 2023-11-15 LAB — CSF CULTURE W GRAM STAIN
Culture: NO GROWTH
Gram Stain: NONE SEEN

## 2023-11-16 LAB — CULTURE, BLOOD (ROUTINE X 2): Culture: NO GROWTH

## 2023-11-19 LAB — CULTURE, BLOOD (ROUTINE X 2): Culture: NO GROWTH
